# Patient Record
Sex: Female | Born: 1955 | Race: Black or African American | Hispanic: No | Smoking: Never smoker
Health system: Southern US, Community
[De-identification: ages and names within clinical notes are randomized; demographics above are authoritative.]

## PROBLEM LIST (undated history)

## (undated) ENCOUNTER — Emergency Department (HOSPITAL_BASED_OUTPATIENT_CLINIC_OR_DEPARTMENT_OTHER): Admission: EM | Payer: Medicare Other

## (undated) DIAGNOSIS — F419 Anxiety disorder, unspecified: Secondary | ICD-10-CM

## (undated) DIAGNOSIS — Z9889 Other specified postprocedural states: Secondary | ICD-10-CM

## (undated) DIAGNOSIS — I10 Essential (primary) hypertension: Secondary | ICD-10-CM

## (undated) DIAGNOSIS — R112 Nausea with vomiting, unspecified: Secondary | ICD-10-CM

## (undated) DIAGNOSIS — K219 Gastro-esophageal reflux disease without esophagitis: Secondary | ICD-10-CM

## (undated) DIAGNOSIS — N289 Disorder of kidney and ureter, unspecified: Secondary | ICD-10-CM

## (undated) DIAGNOSIS — E213 Hyperparathyroidism, unspecified: Secondary | ICD-10-CM

## (undated) DIAGNOSIS — D649 Anemia, unspecified: Secondary | ICD-10-CM

## (undated) DIAGNOSIS — G4730 Sleep apnea, unspecified: Secondary | ICD-10-CM

## (undated) DIAGNOSIS — M1990 Unspecified osteoarthritis, unspecified site: Secondary | ICD-10-CM

## (undated) HISTORY — PX: TUBAL LIGATION: SHX77

## (undated) HISTORY — PX: ABDOMINAL HYSTERECTOMY: SHX81

---

## 2000-11-06 HISTORY — PX: CHOLECYSTECTOMY: SHX55

## 2003-11-07 HISTORY — PX: ABDOMINAL HYSTERECTOMY: SHX81

## 2006-11-06 HISTORY — PX: TOTAL KNEE ARTHROPLASTY: SHX125

## 2008-11-06 HISTORY — PX: COLONOSCOPY: SHX174

## 2015-11-07 HISTORY — PX: BREAST EXCISIONAL BIOPSY: SUR124

## 2017-07-11 DIAGNOSIS — N2581 Secondary hyperparathyroidism of renal origin: Secondary | ICD-10-CM | POA: Insufficient documentation

## 2017-11-06 HISTORY — PX: A/V FISTULAGRAM: CATH118298

## 2018-08-15 DIAGNOSIS — F329 Major depressive disorder, single episode, unspecified: Secondary | ICD-10-CM

## 2018-08-15 HISTORY — DX: Major depressive disorder, single episode, unspecified: F32.9

## 2018-12-19 DIAGNOSIS — N289 Disorder of kidney and ureter, unspecified: Secondary | ICD-10-CM

## 2018-12-19 HISTORY — DX: Disorder of kidney and ureter, unspecified: N28.9

## 2018-12-26 DIAGNOSIS — D509 Iron deficiency anemia, unspecified: Secondary | ICD-10-CM | POA: Insufficient documentation

## 2019-11-07 HISTORY — PX: IR AV DIALY SHUNT INTRO NEEDLE/INTRAC INITIAL W/PTA/STENT/IMG LT: IMG6104

## 2021-06-20 DIAGNOSIS — I739 Peripheral vascular disease, unspecified: Secondary | ICD-10-CM | POA: Insufficient documentation

## 2021-07-14 ENCOUNTER — Encounter (HOSPITAL_BASED_OUTPATIENT_CLINIC_OR_DEPARTMENT_OTHER): Payer: Self-pay | Admitting: Emergency Medicine

## 2021-07-14 ENCOUNTER — Other Ambulatory Visit: Payer: Self-pay

## 2021-07-14 ENCOUNTER — Emergency Department (HOSPITAL_BASED_OUTPATIENT_CLINIC_OR_DEPARTMENT_OTHER): Payer: Commercial Managed Care - PPO | Admitting: Radiology

## 2021-07-14 DIAGNOSIS — N186 End stage renal disease: Secondary | ICD-10-CM | POA: Diagnosis not present

## 2021-07-14 DIAGNOSIS — Z992 Dependence on renal dialysis: Secondary | ICD-10-CM | POA: Insufficient documentation

## 2021-07-14 DIAGNOSIS — Y93K1 Activity, walking an animal: Secondary | ICD-10-CM | POA: Diagnosis not present

## 2021-07-14 DIAGNOSIS — M25532 Pain in left wrist: Secondary | ICD-10-CM | POA: Diagnosis not present

## 2021-07-14 DIAGNOSIS — S6992XA Unspecified injury of left wrist, hand and finger(s), initial encounter: Secondary | ICD-10-CM | POA: Diagnosis present

## 2021-07-14 DIAGNOSIS — S63502A Unspecified sprain of left wrist, initial encounter: Secondary | ICD-10-CM | POA: Diagnosis not present

## 2021-07-14 DIAGNOSIS — W19XXXA Unspecified fall, initial encounter: Secondary | ICD-10-CM | POA: Insufficient documentation

## 2021-07-14 NOTE — ED Triage Notes (Signed)
Pt arrives pov with c/o mechanical fall pta, c/o Left wrist pain. Swelling, deformity noted.

## 2021-07-15 ENCOUNTER — Emergency Department (HOSPITAL_BASED_OUTPATIENT_CLINIC_OR_DEPARTMENT_OTHER)
Admission: EM | Admit: 2021-07-15 | Discharge: 2021-07-15 | Disposition: A | Discharge status: Home / Self Care | Payer: Commercial Managed Care - PPO | Attending: Emergency Medicine | Admitting: Emergency Medicine

## 2021-07-15 DIAGNOSIS — S63502A Unspecified sprain of left wrist, initial encounter: Secondary | ICD-10-CM

## 2021-07-15 HISTORY — DX: Disorder of kidney and ureter, unspecified: N28.9

## 2021-07-15 MED ORDER — ACETAMINOPHEN 500 MG PO TABS
1000.0000 mg | ORAL_TABLET | Freq: Once | ORAL | Status: AC
  Administered 2021-07-15: 1000 mg via ORAL
  Filled 2021-07-15: qty 2

## 2021-07-15 NOTE — ED Provider Notes (Signed)
[facility] EMERGENCY DEPT Provider Note   CSN: [id number] Arrival date & time: 07/14/21  2153     History Chief Complaint  Patient presents with   Lytle Michaels    Christina Vasquez is a 65 y.o. female.  Patient is a 65 year old female with history of end-stage renal disease on hemodialysis presenting with complaints of a left wrist injury.  She was walking the dog when she was pulled over and fell.  She landed on her left wrist.  She reports hearing a crack and has been having pain and swelling since.  She denies other injury.  The history is provided by the patient.      Past Medical History:  Diagnosis Date   Renal disorder     There are no problems to display for this patient.   Past Surgical History:  Procedure Laterality Date   ABDOMINAL HYSTERECTOMY       OB History   No obstetric history on file.     History reviewed. No pertinent family history.  Social History   Tobacco Use   Smoking status: Never  Substance Use Topics   Alcohol use: Not Currently    Comment: occ   Drug use: Never    Home Medications Prior to Admission medications   Not on File    Allergies    Tetracyclines & related  Review of Systems   Review of Systems  All other systems reviewed and are negative.  Physical Exam Updated Vital Signs BP (!) 141/102 (BP Location: Right Arm)   Pulse 81   Temp 99.4 F (37.4 C) (Oral)   Resp 20   Ht 5\' 2"  (1.575 m)   Wt 128.8 kg   SpO2 100%   BMI 51.94 kg/m   Physical Exam Vitals and nursing note reviewed.  Constitutional:      General: She is not in acute distress.    Appearance: Normal appearance. She is not ill-appearing.  HENT:     Head: Normocephalic and atraumatic.  Pulmonary:     Effort: Pulmonary effort is normal.  Musculoskeletal:     Comments: There is swelling overlying the distal radius.  There is no obvious deformity otherwise.  Ulnar and radial pulses are easily palpable and motor and sensation are intact  throughout all fingers.  Skin:    General: Skin is warm and dry.  Neurological:     Mental Status: She is alert.    ED Results / Procedures / Treatments   Labs (all labs ordered are listed, but only abnormal results are displayed) Labs Reviewed - No data to display  EKG None  Radiology DG Forearm Left  Result Date: 07/14/2021 CLINICAL DATA:  Swelling, fall EXAM: LEFT FOREARM - 2 VIEW COMPARISON:  None. FINDINGS: No acute bony abnormality. Specifically, no fracture, subluxation, or dislocation. Soft tissues unremarkable. IMPRESSION: No acute bony abnormality. Electronically Signed   By: Rolm Baptise M.D.   On: 07/14/2021 22:35   DG Wrist Complete Left  Result Date: 07/14/2021 CLINICAL DATA:  Fall, left wrist pain, deformity, swelling EXAM: LEFT WRIST - COMPLETE 3+ VIEW COMPARISON:  None. FINDINGS: Subtle lucency noted in the distal left radius without well-defined fracture line. This may be related to osteopenia. No subluxation or dislocation. Soft tissues are intact. IMPRESSION: Subtle lucency in the distal radius without well-defined fracture line. Consider immobilization and repeat imaging in 1 week if symptoms persist. Electronically Signed   By: Rolm Baptise M.D.   On: 07/14/2021 22:35    Procedures Procedures  Medications Ordered in ED Medications - No data to display  ED Course  I have reviewed the triage vital signs and the nursing notes.  Pertinent labs & imaging results that were available during my care of the patient were reviewed by me and considered in my medical decision making (see chart for details).    MDM Rules/Calculators/A&P  X-rays show a subtle lucency over the distal radius that could potentially be a nondisplaced fracture.  Patient will be placed in a volar wrist splint and advised to follow-up with orthopedics in 1 week.  Final Clinical Impression(s) / ED Diagnoses Final diagnoses:  None    Rx / DC Orders ED Discharge Orders     None         Veryl Speak, MD 07/15/21 [phone]

## 2021-07-15 NOTE — Discharge Instructions (Addendum)
Wear wrist splint as applied until followed up by orthopedics.  Ice for 20 minutes every 2 hours while awake for the next 2 days.  Take Tylenol 1000 mg every 6 hours as needed for pain.  Follow-up with orthopedics in 1 week.  The contact information for Dr. Sammuel Hines has been provided in this discharge summary for you to call and make these arrangements.

## 2021-07-15 NOTE — ED Notes (Signed)
Patient left ED with ABCs intact, alert and oriented x4, respirations even and unlabored. Discharge instructions reviewed and all questions answered.   

## 2021-07-15 NOTE — ED Notes (Signed)
Patient fell on slope last night around 2130 after foot slipped. Did not hit head, -LOC. Stated heard left wrist "crack". +PMS. Complaining of 8/10 pain. ABCs intact, alert and oriented x4, respirations even and unlabored.

## 2021-07-22 ENCOUNTER — Encounter (HOSPITAL_BASED_OUTPATIENT_CLINIC_OR_DEPARTMENT_OTHER): Payer: Self-pay | Admitting: Orthopaedic Surgery

## 2021-07-22 ENCOUNTER — Encounter: Payer: Self-pay | Admitting: Orthopaedic Surgery

## 2021-07-22 ENCOUNTER — Ambulatory Visit (HOSPITAL_BASED_OUTPATIENT_CLINIC_OR_DEPARTMENT_OTHER): Payer: Commercial Managed Care - PPO | Admitting: Orthopaedic Surgery

## 2021-07-22 ENCOUNTER — Other Ambulatory Visit (HOSPITAL_BASED_OUTPATIENT_CLINIC_OR_DEPARTMENT_OTHER): Payer: Self-pay

## 2021-07-22 ENCOUNTER — Other Ambulatory Visit: Payer: Self-pay

## 2021-07-22 DIAGNOSIS — S63502A Unspecified sprain of left wrist, initial encounter: Secondary | ICD-10-CM | POA: Diagnosis not present

## 2021-07-22 MED ORDER — DICLOFENAC SODIUM 1 % EX GEL: 4.0000 g | Freq: Four times a day (QID) | CUTANEOUS | Status: DC

## 2021-07-22 MED ORDER — DICLOFENAC SODIUM 1 % EX GEL
CUTANEOUS | 2 refills | Status: DC
  Filled 2021-07-22: qty 300, 18d supply, fill #0

## 2021-07-22 NOTE — Addendum Note (Signed)
Addended by: Yevonne Pax on: 07/22/2021 11:49 AM   Modules accepted: Orders

## 2021-07-22 NOTE — Progress Notes (Signed)
Chief Complaint: Left wrist pain     History of Present Illness:   Pain Score: 0/10 SANE: 75/100  Christina Vasquez is a 65 y.o. female with left wrist pain after she struck it directly on a tree root on July 15, 2021.  She felt a pop and immediately went to the emergency room.  At that time she was told she possibly had a nondisplaced fracture and was subsequently placed in a wrist brace.  She presents today with continued pain diffusely about the wrist.  This is worse with any type of motion.  She has been wearing her brace 24/7.  She is currently retired.  She denies any numbness or tingling to the fingers.  Overall she is slowly improving.  She has been taking Tylenol which helps somewhat but is unable to take anti-inflammatories due to kidney disease.    Surgical History:   None  PMH/PSH/Family History/Social History/Meds/Allergies:    Past Medical History:  Diagnosis Date   Renal disorder    Past Surgical History:  Procedure Laterality Date   ABDOMINAL HYSTERECTOMY     Social History   Socioeconomic History   Marital status: Widowed    Spouse name: Not on file   Number of children: Not on file   Years of education: Not on file   Highest education level: Not on file  Occupational History   Not on file  Tobacco Use   Smoking status: Never   Smokeless tobacco: Not on file  Substance and Sexual Activity   Alcohol use: Not Currently    Comment: occ   Drug use: Never   Sexual activity: Not on file  Other Topics Concern   Not on file  Social History Narrative   Not on file   Social Determinants of Health   Financial Resource Strain: Not on file  Food Insecurity: Not on file  Transportation Needs: Not on file  Physical Activity: Not on file  Stress: Not on file  Social Connections: Not on file   History reviewed. No pertinent family history. Allergies  Allergen Reactions   Tetracyclines & Related Hives   No current  outpatient medications on file.   Current Facility-Administered Medications  Medication Dose Route Frequency Provider Last Rate Last Admin   diclofenac Sodium (VOLTAREN) 1 % topical gel 4 g  4 g Topical QID Vanetta Mulders, MD       No results found.  Review of Systems:   A ROS was performed including pertinent positives and negatives as documented in the HPI.  Physical Exam :   Constitutional: NAD and appears stated age Neurological: Alert and oriented Psych: Appropriate affect and cooperative There were no vitals taken for this visit.   Comprehensive Musculoskeletal Exam:   She is positively tender about the dorsal aspect of the wrist profusely.  She is also tender volarly about the length of the FCR.  She has tenderness with resisted wrist extension.  Her range of motion of the wrist is approximately 20 degrees of flexion extension this causes pain.  She has pain with any pro supination.  2+ radial pulse.  Compartments are soft and compressible  Imaging:   Xray (3 views of left wrist): Normal   I personally reviewed and interpreted the radiographs.   Assessment:   65 year old female with left wrist pain  consistent with a strain.  I do not see any fracture on her x-ray.  I have advised that oftentimes strains can be more disabling than a discrete fracture.  At this time I would like her to come out of her wrist brace in order to start performing range of motion so that the joint does not become stiff and painful.  She will work on flexion extension as well as pro supination.  I have provided a prescription for Voltaren gel as she is unable to take anti-inflammatories.  She may be in her brace for more strenuous activities.  Plan :    -Follow-up with me in 1 month if no improvement    I personally saw and evaluated the patient, and participated in the management and treatment plan.  Vanetta Mulders, MD Attending Physician, Orthopedic Surgery  This document was dictated using  Dragon voice recognition software. A reasonable attempt at proof reading has been made to minimize errors.

## 2021-07-25 ENCOUNTER — Ambulatory Visit (HOSPITAL_BASED_OUTPATIENT_CLINIC_OR_DEPARTMENT_OTHER): Payer: Commercial Managed Care - PPO | Admitting: Orthopaedic Surgery

## 2022-03-02 ENCOUNTER — Other Ambulatory Visit: Payer: Self-pay | Admitting: Orthopedic Surgery

## 2022-03-02 DIAGNOSIS — M75101 Unspecified rotator cuff tear or rupture of right shoulder, not specified as traumatic: Secondary | ICD-10-CM

## 2022-03-11 ENCOUNTER — Ambulatory Visit
Admission: RE | Admit: 2022-03-11 | Discharge: 2022-03-11 | Disposition: A | Discharge status: Home / Self Care | Payer: Commercial Managed Care - PPO | Source: Ambulatory Visit | Attending: Orthopedic Surgery | Admitting: Orthopedic Surgery

## 2022-03-11 DIAGNOSIS — M12811 Other specific arthropathies, not elsewhere classified, right shoulder: Secondary | ICD-10-CM

## 2022-03-28 ENCOUNTER — Other Ambulatory Visit: Payer: Self-pay | Admitting: Orthopaedic Surgery

## 2022-03-28 DIAGNOSIS — Z01818 Encounter for other preprocedural examination: Secondary | ICD-10-CM

## 2022-04-07 ENCOUNTER — Ambulatory Visit
Admission: RE | Admit: 2022-04-07 | Discharge: 2022-04-07 | Disposition: A | Discharge status: Home / Self Care | Payer: Medicare Other | Source: Ambulatory Visit | Attending: Orthopaedic Surgery | Admitting: Orthopaedic Surgery

## 2022-04-07 DIAGNOSIS — Z01818 Encounter for other preprocedural examination: Secondary | ICD-10-CM

## 2022-05-26 NOTE — Pre-Procedure Instructions (Addendum)
Surgical Instructions    Your procedure is scheduled on Thursday, July 27th.  Report to [facility] Main Entrance "A" at 5:30 A.M., then check in with the Admitting office.  Call this number if you have problems the morning of surgery:  [phone]   If you have any questions prior to your surgery date call [phone]: Open Monday-Friday 8am-4pm    Remember:  Do not eat after midnight the night before your surgery  You may drink clear liquids until 4:30 a.m. the morning of your surgery.   Clear liquids allowed are: Water, Non-Citrus Juices (without pulp), Carbonated Beverages, Clear Tea, Black Coffee Only (NO MILK, CREAM OR POWDERED CREAMER of any kind), and Gatorade.   Enhanced Recovery after Surgery for Orthopedics Enhanced Recovery after Surgery is a protocol used to improve the stress on your body and your recovery after surgery.  Patient Instructions  The day of surgery (if you do NOT have diabetes):  Drink ONE (1) Pre-Surgery Clear Ensure by 4:30 am the morning of surgery   This drink was given to you during your hospital  pre-op appointment visit. Nothing else to drink after completing the  Pre-Surgery Clear Ensure.         If you have questions, please contact your surgeon's office.     Take these medicines the morning of surgery with A SIP OF WATER  amLODipine (NORVASC)  venlafaxine (EFFEXOR)  acetaminophen (TYLENOL)-as needed  As of today, STOP taking any Aspirin (unless otherwise instructed by your surgeon) Aleve, Naproxen, Ibuprofen, Motrin, Advil, Goody's, BC's, all herbal medications, fish oil, and all vitamins.          WHAT DO I DO ABOUT MY DIABETES MEDICATION?  The day of surgery, do not take other diabetes injectables, including MOUNJARO.    HOW TO MANAGE YOUR DIABETES BEFORE AND AFTER SURGERY  Why is it important to control my blood sugar before and after surgery? Improving blood sugar levels before and after surgery helps healing and can limit  problems. A way of improving blood sugar control is eating a healthy diet by:  Eating less sugar and carbohydrates  Increasing activity/exercise  Talking with your doctor about reaching your blood sugar goals High blood sugars (greater than 180 mg/dL) can raise your risk of infections and slow your recovery, so you will need to focus on controlling your diabetes during the weeks before surgery. Make sure that the doctor who takes care of your diabetes knows about your planned surgery including the date and location.  How do I manage my blood sugar before surgery? Check your blood sugar at least 4 times a day, starting 2 days before surgery, to make sure that the level is not too high or low.  Check your blood sugar the morning of your surgery when you wake up and every 2 hours until you get to the Short Stay unit.  If your blood sugar is less than 70 mg/dL, you will need to treat for low blood sugar: Do not take insulin. Treat a low blood sugar (less than 70 mg/dL) with  cup of clear juice (cranberry or apple), 4 glucose tablets, OR glucose gel. Recheck blood sugar in 15 minutes after treatment (to make sure it is greater than 70 mg/dL). If your blood sugar is not greater than 70 mg/dL on recheck, call [phone] for further instructions. Report your blood sugar to the short stay nurse when you get to Short Stay.  If you are admitted to the hospital after surgery:  Your blood sugar will be checked by the staff and you will probably be given insulin after surgery (instead of oral diabetes medicines) to make sure you have good blood sugar levels. The goal for blood sugar control after surgery is 80-180 mg/dL.             Do NOT Smoke (Tobacco/Vaping) for 24 hours prior to your procedure.  If you use a CPAP at night, you may bring your mask/headgear for your overnight stay.   Contacts, glasses, piercing's, hearing aid's, dentures or partials may not be worn into surgery, please bring cases  for these belongings.    For patients admitted to the hospital, discharge time will be determined by your treatment team.   Patients discharged the day of surgery will not be allowed to drive home, and someone needs to stay with them for 24 hours.  SURGICAL WAITING ROOM VISITATION Patients having surgery or a procedure may have no more than 2 support people in the waiting area - these visitors may rotate.   Children under the age of 27 must have an adult with them who is not the patient. If the patient needs to stay at the hospital during part of their recovery, the visitor guidelines for inpatient rooms apply. Pre-op nurse will coordinate an appropriate time for 1 support person to accompany patient in pre-op.  This support person may not rotate.   Please refer to the [facility] website for the visitor guidelines for Inpatients (after your surgery is over and you are in a regular room).                                     [facility]- Preparing for Total Shoulder Arthroplasty   Before surgery, you can play an important role. Because skin is not sterile, your skin needs to be as free of germs as possible. You can reduce the number of germs on your skin by using the following products. Benzoyl Peroxide Gel Reduces the number of germs present on the skin Applied twice a day to shoulder area starting two days before surgery   Chlorhexidine Gluconate (CHG) Soap An antiseptic cleaner that kills germs and bonds with the skin to continue killing germs even after washing Used for showering the night before surgery and morning of surgery   Oral Hygiene is also important to reduce your risk of infection.                                    Remember - BRUSH YOUR TEETH THE MORNING OF SURGERY WITH YOUR REGULAR TOOTHPASTE  ==================================================================  Please follow these instructions carefully:  BENZOYL PEROXIDE 5% GEL  Please do not use if you have an  allergy to benzoyl peroxide.   If your skin becomes reddened/irritated stop using the benzoyl peroxide.  Starting two days before surgery, apply as follows: Apply benzoyl peroxide in the morning and at night. Apply after taking a shower. If you are not taking a shower clean entire shoulder front, back, and side along with the armpit with a clean wet washcloth.  Place a quarter-sized dollop on your shoulder and rub in thoroughly, making sure to cover the front, back, and side of your shoulder, along with the armpit.   2 days before ____ AM   ____ PM  1 day before ____ AM   ____ PM                             Do this twice a day for two days.  (Last application is the night before surgery, AFTER using the CHG soap as described below).  Do NOT apply benzoyl peroxide gel on the day of surgery.  CHLORHEXIDINE GLUCONATE (CHG) SOAP  Please do not use if you have an allergy to CHG or antibacterial soaps. If your skin becomes reddened/irritated stop using the CHG.   Do not shave (including legs and underarms) for at least 48 hours prior to first CHG shower. It is OK to shave your face.  Starting the night before surgery, use CHG soap as follows:  Shower the NIGHT BEFORE SURGERY and MORNING OF SURGERY with CHG.  If you choose to wash your hair, wash your hair first as usual with your normal shampoo.  After shampooing, rinse your hair and body thoroughly to remove the shampoo.  Use CHG as you would any other liquid soap.  You can apply CHG directly to the skin and wash gently with a scrungie or a clean washcloth.  Apply the CHG soap to your body ONLY FROM THE NECK DOWN.  Do not use on open wounds or open sores.  Avoid contact with your eyes, ears, mouth, and genitals (private parts).  Wash face and genitals (private parts) with your normal soap.  Wash thoroughly, paying special attention to the area where your surgery will be performed.  Thoroughly rinse your body with warm water  from the neck down.  DO NOT shower/wash with your normal soap after using and rinsing off the CHG soap.   Pat yourself dry with a CLEAN TOWEL.    Apply benzoyl peroxide.   Wear CLEAN PAJAMAS to bed the night before surgery; wear comfortable clothes the morning of surgery.  Place CLEAN SHEETS on your bed the night of your first shower and DO NOT SLEEP WITH PETS.    Day of Surgery: Take a shower with CHG soap. Do not wear jewelry or makeup Do not wear lotions, powders, perfumes, or deodorant. Do not shave 48 hours prior to surgery.  Do not bring valuables to the hospital.  [facility] is not responsible for any belongings or valuables. Do not wear nail polish, gel polish, artificial nails, or any other type of covering on natural nails (fingers and toes) If you have artificial nails or gel coating that need to be removed by a nail salon, please have this removed prior to surgery. Artificial nails or gel coating may interfere with anesthesia's ability to adequately monitor your vital signs. Wear Clean/Comfortable clothing the morning of surgery Remember to brush your teeth WITH YOUR REGULAR TOOTHPASTE.   Please read over the following fact sheets that you were given.    If you received a COVID test during your pre-op visit  it is requested that you wear a mask when out in public, stay away from anyone that may not be feeling well and notify your surgeon if you develop symptoms. If you have been in contact with anyone that has tested positive in the last 10 days please notify you surgeon.

## 2022-05-29 ENCOUNTER — Other Ambulatory Visit: Payer: Self-pay

## 2022-05-29 ENCOUNTER — Encounter (HOSPITAL_COMMUNITY)
Admission: RE | Admit: 2022-05-29 | Discharge: 2022-05-29 | Disposition: A | Discharge status: Home / Self Care | Payer: Medicare Other | Source: Ambulatory Visit | Attending: Orthopaedic Surgery | Admitting: Orthopaedic Surgery

## 2022-05-29 ENCOUNTER — Encounter (HOSPITAL_COMMUNITY): Payer: Self-pay

## 2022-05-29 VITALS — BP 146/76 | HR 83 | Temp 98.6°F | Resp 18 | Ht 62.0 in | Wt 233.0 lb

## 2022-05-29 DIAGNOSIS — I10 Essential (primary) hypertension: Secondary | ICD-10-CM

## 2022-05-29 DIAGNOSIS — Z01818 Encounter for other preprocedural examination: Secondary | ICD-10-CM

## 2022-05-29 HISTORY — DX: Essential (primary) hypertension: I10

## 2022-05-29 HISTORY — DX: Other specified postprocedural states: Z98.890

## 2022-05-29 HISTORY — DX: Sleep apnea, unspecified: G47.30

## 2022-05-29 HISTORY — DX: Nausea with vomiting, unspecified: R11.2

## 2022-05-29 HISTORY — DX: Anxiety disorder, unspecified: F41.9

## 2022-05-29 HISTORY — DX: Unspecified osteoarthritis, unspecified site: M19.90

## 2022-05-29 LAB — CBC
HCT: 31.6 % — ABNORMAL LOW (ref 36.0–46.0)
Hemoglobin: 10.5 g/dL — ABNORMAL LOW (ref 12.0–15.0)
MCH: 27.9 pg (ref 26.0–34.0)
MCHC: 33.2 g/dL (ref 30.0–36.0)
MCV: 83.8 fL (ref 80.0–100.0)
Platelets: 186 10*3/uL (ref 150–400)
RBC: 3.77 MIL/uL — ABNORMAL LOW (ref 3.87–5.11)
RDW: 14.8 % (ref 11.5–15.5)
WBC: 5.4 10*3/uL (ref 4.0–10.5)
nRBC: 0 % (ref 0.0–0.2)

## 2022-05-29 LAB — BASIC METABOLIC PANEL
Anion gap: 10 (ref 5–15)
BUN: 12 mg/dL (ref 8–23)
CO2: 26 mmol/L (ref 22–32)
Calcium: 8.8 mg/dL — ABNORMAL LOW (ref 8.9–10.3)
Chloride: 104 mmol/L (ref 98–111)
Creatinine, Ser: 3.58 mg/dL — ABNORMAL HIGH (ref 0.44–1.00)
GFR, Estimated: 14 mL/min — ABNORMAL LOW (ref >60)
Glucose, Bld: 81 mg/dL (ref 70–99)
Potassium: 3.7 mmol/L (ref 3.5–5.1)
Sodium: 140 mmol/L (ref 135–145)

## 2022-05-29 LAB — SURGICAL PCR SCREEN
MRSA, PCR: NEGATIVE
Staphylococcus aureus: POSITIVE — AB

## 2022-05-29 NOTE — Progress Notes (Signed)
PCP - Dr. Anderson Malta Jo-pt does not currently have a PCP locally. Pt recently moved from [place] to [place].  Cardiologist - Dr. Malachi Carl Pagidipati  PPM/ICD - n/a  Chest x-ray - n/a EKG - 05/29/22 Stress Test - 05/21/18 ECHO - 05/27/18 Cardiac Cath - denies  Sleep Study - OSA+ CPAP - denies use   Blood Thinner Instructions: n/a Aspirin Instructions: n/a  ERAS Protcol -Clear liquids until 0430 DOS PRE-SURGERY Ensure or G2- Ensure provided.  COVID TEST- n/a  Anesthesia review: Yes, HD pt.  Patient denies shortness of breath, fever, cough and chest pain at PAT appointment   All instructions explained to the patient, with a verbal understanding of the material. Patient agrees to go over the instructions while at home for a better understanding. Patient also instructed to self quarantine after being tested for COVID-19. The opportunity to ask questions was provided.

## 2022-05-31 NOTE — H&P (Signed)
PREOPERATIVE H&P  Chief Complaint: DJD RIGHT SHOULDER  HPI: Christina Vasquez is a 66 y.o. female who presents for preoperative history and physical prior to scheduled surgery, Procedure(s): REVERSE SHOULDER ARTHROPLASTY.   Patient has a past medical history significant for stage 4 ESRD, PONV, HTN, sleep apnea.   Patient is a 66 year-old female who presents with bilateral shoulder complaints.  She said the right shoulder has bothered her for years.  She did have a previous surgery on the right shoulder.  She describes it as an arthroscopy with some other procedures.  She has had decreased range of motion with progressively worsening increasing pain.  Left shoulder presents with about two months now.  She states she was holding a door open in extension and felt something tear.  She has had increased pain since that time as well.  Symptoms are rated as moderate to severe, and have been worsening.  This is significantly impairing activities of daily living.    Please see clinic note for further details on this patient's care.    She has elected for surgical management.   Past Medical History:  Diagnosis Date   Anxiety    Arthritis    Hypertension    PONV (postoperative nausea and vomiting)    Renal disorder 12/19/2018   Stage 4 ESRD. HD-MWF   Sleep apnea    Past Surgical History:  Procedure Laterality Date   ABDOMINAL HYSTERECTOMY     Social History   Socioeconomic History   Marital status: Widowed    Spouse name: Not on file   Number of children: Not on file   Years of education: Not on file   Highest education level: Not on file  Occupational History   Not on file  Tobacco Use   Smoking status: Never   Smokeless tobacco: Not on file  Vaping Use   Vaping Use: Never used  Substance and Sexual Activity   Alcohol use: Not Currently    Comment: occ   Drug use: Never   Sexual activity: Not on file  Other Topics Concern   Not on file  Social History Narrative   Not  on file   Social Determinants of Health   Financial Resource Strain: Not on file  Food Insecurity: Not on file  Transportation Needs: Not on file  Physical Activity: Not on file  Stress: Not on file  Social Connections: Not on file   No family history on file. Allergies  Allergen Reactions   Tetracyclines & Related Hives   Prior to Admission medications   Medication Sig Start Date End Date Taking? Authorizing Provider  acetaminophen (TYLENOL) 650 MG CR tablet Take 1,300 mg by mouth every 8 (eight) hours as needed for pain.   Yes [provider]  amLODipine (NORVASC) 10 MG tablet Take 10 mg by mouth daily. 03/10/22  Yes [provider]  metoprolol succinate (TOPROL-XL) 100 MG 24 hr tablet Take 100 mg by mouth at bedtime. 03/10/22  Yes [provider]  MOUNJARO 2.5 MG/0.5ML Pen Inject 2.5 mg into the skin every Saturday. 05/08/22  Yes [provider]  sucroferric oxyhydroxide (VELPHORO) 500 MG chewable tablet Chew 1,000 mg by mouth 3 (three) times daily with meals.   Yes [provider]  venlafaxine (EFFEXOR) 37.5 MG tablet Take 37.5 mg by mouth daily. 12/01/21  Yes [provider]    ROS: All other systems have been reviewed and were otherwise negative with the exception of those mentioned in the  HPI and as above.  Physical Exam: General: Alert, no acute distress Cardiovascular: No pedal edema Respiratory: No cyanosis, no use of accessory musculature GI: No organomegaly, abdomen is soft and non-tender Skin: No lesions in the area of chief complaint Neurologic: Sensation intact distally Psychiatric: Patient is competent for consent with normal mood and affect Lymphatic: No axillary or cervical lymphadenopathy  MUSCULOSKELETAL:  On examination active forward elevation on the right to about 70, passive  80-90.  External rotation to -45. Internal rotation to the front pocket.  Left side active forward elevation to about 140. Passive the  same. External rotation to zero. Internal rotation to the belt  line. Cuff strength is weak throughout.   Imaging: MRI and X-rays reviewed of the bilateral shoulders and shows severe glenohumeral arthritis. On the right side she has significant bone loss in the glenoid as well.   BMI: Estimated body mass index is 42.62 kg/m as calculated from the following:   Height as of 05/29/22: '5\' 2"'$  (1.575 m).   Weight as of 05/29/22: 105.7 kg.  No results found for: "ALBUMIN" Diabetes: Patient does not have a diagnosis of diabetes.     Smoking Status:   reports that she has never smoked. She does not have any smokeless tobacco history on file.    Assessment: DJD RIGHT SHOULDER  Plan: Plan for Procedure(s): REVERSE SHOULDER ARTHROPLASTY  The risks benefits and alternatives were discussed with the patient including but not limited to the risks of nonoperative treatment, versus surgical intervention including infection, bleeding, nerve injury,  blood clots, cardiopulmonary complications, morbidity, mortality, among others, and they were willing to proceed.   We additionally specifically discussed risks of axillary nerve injury, infection, periprosthetic fracture, continued pain and longevity of implants prior to beginning procedure.    Patient will be admitted for inpatient treatment for surgery, pain control, OT, prophylactic antibiotics, VTE prophylaxis, and discharge planning. The patient is planning to be discharged home with outpatient PT.   The patient acknowledged the explanation, agreed to proceed with the plan and consent was signed.   She received operative clearance from her PCP and nephrologist. She will be admitted overnight with a plan for dialysis in the morning after surgery.  Operative Plan: Right reverse total shoulder arthroplasty Discharge Medications: Standard DVT Prophylaxis: Aspirin Physical Therapy: Outpatient PT Special Discharge needs: Sling. [place], PA-C  05/31/2022 2:24 PM

## 2022-05-31 NOTE — Anesthesia Preprocedure Evaluation (Signed)
Anesthesia Evaluation  Patient identified by MRN, date of birth, ID band Patient awake    Reviewed: Allergy & Precautions, NPO status , Patient's Chart, lab work & pertinent test results  History of Anesthesia Complications Negative for: history of anesthetic complications  Airway Mallampati: III  TM Distance: >3 FB Neck ROM: Full    Dental  (+) Dental Advisory Given   Pulmonary sleep apnea ,    Pulmonary exam normal        Cardiovascular hypertension, Pt. on home beta blockers and Pt. on medications Normal cardiovascular exam     Neuro/Psych negative neurological ROS     GI/Hepatic negative GI ROS, Neg liver ROS,   Endo/Other  Morbid obesity  Renal/GU ESRF and DialysisRenal disease  negative genitourinary   Musculoskeletal negative musculoskeletal ROS (+)   Abdominal   Peds  Hematology  (+) Blood dyscrasia, anemia ,   Anesthesia Other Findings   Reproductive/Obstetrics                           Anesthesia Physical Anesthesia Plan  ASA: 3  Anesthesia Plan: General   Post-op Pain Management: Regional block* and Tylenol PO (pre-op)*   Induction: Intravenous  PONV Risk Score and Plan: 3 and Ondansetron, Dexamethasone, Treatment may vary due to age or medical condition and Midazolam  Airway Management Planned: Oral ETT  Additional Equipment: None  Intra-op Plan:   Post-operative Plan: Extubation in OR  Informed Consent: I have reviewed the patients History and Physical, chart, labs and discussed the procedure including the risks, benefits and alternatives for the proposed anesthesia with the patient or authorized representative who has indicated his/her understanding and acceptance.     Dental advisory given  Plan Discussed with:   Anesthesia Plan Comments:        Anesthesia Quick Evaluation

## 2022-06-01 ENCOUNTER — Encounter (HOSPITAL_COMMUNITY): Payer: Self-pay | Admitting: Orthopaedic Surgery

## 2022-06-01 ENCOUNTER — Encounter (HOSPITAL_COMMUNITY)
Admission: RE | Disposition: A | Discharge status: Home / Self Care | Payer: Self-pay | Source: Home / Self Care | Attending: Orthopaedic Surgery

## 2022-06-01 ENCOUNTER — Ambulatory Visit (HOSPITAL_COMMUNITY): Payer: Medicare Other

## 2022-06-01 ENCOUNTER — Observation Stay (HOSPITAL_COMMUNITY)
Admission: RE | Admit: 2022-06-01 | Discharge: 2022-06-02 | Disposition: A | Discharge status: Home / Self Care | Payer: Medicare Other | Attending: Orthopaedic Surgery | Admitting: Orthopaedic Surgery

## 2022-06-01 ENCOUNTER — Other Ambulatory Visit: Payer: Self-pay

## 2022-06-01 ENCOUNTER — Ambulatory Visit (HOSPITAL_BASED_OUTPATIENT_CLINIC_OR_DEPARTMENT_OTHER): Payer: Medicare Other | Admitting: Anesthesiology

## 2022-06-01 ENCOUNTER — Ambulatory Visit (HOSPITAL_COMMUNITY): Payer: Medicare Other | Admitting: Physician Assistant

## 2022-06-01 DIAGNOSIS — N186 End stage renal disease: Secondary | ICD-10-CM

## 2022-06-01 DIAGNOSIS — D631 Anemia in chronic kidney disease: Secondary | ICD-10-CM | POA: Insufficient documentation

## 2022-06-01 DIAGNOSIS — M19011 Primary osteoarthritis, right shoulder: Secondary | ICD-10-CM

## 2022-06-01 DIAGNOSIS — E1169 Type 2 diabetes mellitus with other specified complication: Secondary | ICD-10-CM | POA: Diagnosis not present

## 2022-06-01 DIAGNOSIS — I120 Hypertensive chronic kidney disease with stage 5 chronic kidney disease or end stage renal disease: Secondary | ICD-10-CM | POA: Diagnosis not present

## 2022-06-01 DIAGNOSIS — Z992 Dependence on renal dialysis: Secondary | ICD-10-CM

## 2022-06-01 DIAGNOSIS — Z01818 Encounter for other preprocedural examination: Secondary | ICD-10-CM

## 2022-06-01 DIAGNOSIS — Z79899 Other long term (current) drug therapy: Secondary | ICD-10-CM | POA: Diagnosis not present

## 2022-06-01 DIAGNOSIS — Z96611 Presence of right artificial shoulder joint: Secondary | ICD-10-CM

## 2022-06-01 HISTORY — PX: REVERSE SHOULDER ARTHROPLASTY: SHX5054

## 2022-06-01 LAB — POCT I-STAT, CHEM 8
BUN: 24 mg/dL — ABNORMAL HIGH (ref 8–23)
Calcium, Ion: 1.18 mmol/L (ref 1.15–1.40)
Chloride: 95 mmol/L — ABNORMAL LOW (ref 98–111)
Creatinine, Ser: 4.8 mg/dL — ABNORMAL HIGH (ref 0.44–1.00)
Glucose, Bld: 79 mg/dL (ref 70–99)
HCT: 30 % — ABNORMAL LOW (ref 36.0–46.0)
Hemoglobin: 10.2 g/dL — ABNORMAL LOW (ref 12.0–15.0)
Potassium: 3.4 mmol/L — ABNORMAL LOW (ref 3.5–5.1)
Sodium: 138 mmol/L (ref 135–145)
TCO2: 30 mmol/L (ref 22–32)

## 2022-06-01 SURGERY — ARTHROPLASTY, SHOULDER, TOTAL, REVERSE
Anesthesia: General | Site: Shoulder | Laterality: Right

## 2022-06-01 MED ORDER — ONDANSETRON HCL 4 MG/2ML IJ SOLN
INTRAMUSCULAR | Status: AC
  Filled 2022-06-01: qty 2

## 2022-06-01 MED ORDER — 0.9 % SODIUM CHLORIDE (POUR BTL) OPTIME
TOPICAL | Status: DC | PRN
  Administered 2022-06-01: 1000 mL

## 2022-06-01 MED ORDER — OXYCODONE HCL 5 MG/5ML PO SOLN: 5.0000 mg | Freq: Once | ORAL | Status: DC | PRN

## 2022-06-01 MED ORDER — SUFENTANIL CITRATE 50 MCG/ML IV SOLN
INTRAVENOUS | Status: DC | PRN
  Administered 2022-06-01 (×2): 10 ug via INTRAVENOUS

## 2022-06-01 MED ORDER — LIDOCAINE 2% (20 MG/ML) 5 ML SYRINGE
INTRAMUSCULAR | Status: DC | PRN
  Administered 2022-06-01: 100 mg via INTRAVENOUS

## 2022-06-01 MED ORDER — ZOLPIDEM TARTRATE 5 MG PO TABS: 5.0000 mg | ORAL_TABLET | Freq: Every evening | ORAL | Status: DC | PRN

## 2022-06-01 MED ORDER — SUGAMMADEX SODIUM 200 MG/2ML IV SOLN
INTRAVENOUS | Status: DC | PRN
  Administered 2022-06-01: 200 mg via INTRAVENOUS

## 2022-06-01 MED ORDER — LIDOCAINE 2% (20 MG/ML) 5 ML SYRINGE
INTRAMUSCULAR | Status: AC
  Filled 2022-06-01: qty 5

## 2022-06-01 MED ORDER — VANCOMYCIN HCL 1000 MG IV SOLR
INTRAVENOUS | Status: DC | PRN
  Administered 2022-06-01: 1000 mg

## 2022-06-01 MED ORDER — TRANEXAMIC ACID-NACL 1000-0.7 MG/100ML-% IV SOLN
1000.0000 mg | INTRAVENOUS | Status: AC
  Administered 2022-06-01: 1000 mg via INTRAVENOUS
  Filled 2022-06-01: qty 100

## 2022-06-01 MED ORDER — SUCROFERRIC OXYHYDROXIDE 500 MG PO CHEW
1000.0000 mg | CHEWABLE_TABLET | Freq: Three times a day (TID) | ORAL | Status: DC
  Administered 2022-06-01 – 2022-06-02 (×3): 1000 mg via ORAL
  Filled 2022-06-01 (×6): qty 2

## 2022-06-01 MED ORDER — VENLAFAXINE HCL 37.5 MG PO TABS
37.5000 mg | ORAL_TABLET | Freq: Every day | ORAL | Status: DC
  Administered 2022-06-01 – 2022-06-02 (×2): 37.5 mg via ORAL
  Filled 2022-06-01 (×2): qty 1

## 2022-06-01 MED ORDER — ORAL CARE MOUTH RINSE: 15.0000 mL | Freq: Once | OROMUCOSAL | Status: AC

## 2022-06-01 MED ORDER — SODIUM CHLORIDE 0.9 % IV SOLN: INTRAVENOUS | Status: DC

## 2022-06-01 MED ORDER — LACTATED RINGERS IV SOLN: INTRAVENOUS | Status: DC

## 2022-06-01 MED ORDER — CHLORHEXIDINE GLUCONATE 0.12 % MT SOLN
15.0000 mL | Freq: Once | OROMUCOSAL | Status: AC
  Administered 2022-06-01: 15 mL via OROMUCOSAL
  Filled 2022-06-01: qty 15

## 2022-06-01 MED ORDER — DEXAMETHASONE SODIUM PHOSPHATE 10 MG/ML IJ SOLN
INTRAMUSCULAR | Status: AC
  Filled 2022-06-01: qty 1

## 2022-06-01 MED ORDER — DEXAMETHASONE SODIUM PHOSPHATE 10 MG/ML IJ SOLN
INTRAMUSCULAR | Status: DC | PRN
  Administered 2022-06-01: 10 mg via INTRAVENOUS

## 2022-06-01 MED ORDER — CEFAZOLIN SODIUM-DEXTROSE 1-4 GM/50ML-% IV SOLN: 1.0000 g | Freq: Four times a day (QID) | INTRAVENOUS | Status: DC

## 2022-06-01 MED ORDER — ROCURONIUM 10MG/ML (10ML) SYRINGE FOR MEDFUSION PUMP - OPTIME
INTRAVENOUS | Status: DC | PRN
  Administered 2022-06-01: 100 mg via INTRAVENOUS

## 2022-06-01 MED ORDER — MIDAZOLAM HCL 2 MG/2ML IJ SOLN
INTRAMUSCULAR | Status: AC
  Filled 2022-06-01: qty 2

## 2022-06-01 MED ORDER — HYDROMORPHONE HCL 1 MG/ML IJ SOLN: 0.5000 mg | INTRAMUSCULAR | Status: DC | PRN

## 2022-06-01 MED ORDER — PROPOFOL 10 MG/ML IV BOLUS
INTRAVENOUS | Status: AC
  Filled 2022-06-01: qty 20

## 2022-06-01 MED ORDER — SODIUM CHLORIDE (PF) 0.9 % IJ SOLN
INTRAMUSCULAR | Status: AC
  Filled 2022-06-01: qty 10

## 2022-06-01 MED ORDER — BUPIVACAINE LIPOSOME 1.3 % IJ SUSP
INTRAMUSCULAR | Status: DC | PRN
  Administered 2022-06-01: 10 mL via PERINEURAL

## 2022-06-01 MED ORDER — PROPOFOL 10 MG/ML IV BOLUS
INTRAVENOUS | Status: DC | PRN
  Administered 2022-06-01: 140 mg via INTRAVENOUS

## 2022-06-01 MED ORDER — ONDANSETRON HCL 4 MG PO TABS: 4.0000 mg | ORAL_TABLET | Freq: Four times a day (QID) | ORAL | Status: DC | PRN

## 2022-06-01 MED ORDER — DOCUSATE SODIUM 100 MG PO CAPS
100.0000 mg | ORAL_CAPSULE | Freq: Two times a day (BID) | ORAL | Status: DC
Start: 2022-06-01 — End: 2022-06-03
  Administered 2022-06-01 – 2022-06-02 (×2): 100 mg via ORAL
  Filled 2022-06-01 (×2): qty 1

## 2022-06-01 MED ORDER — DIPHENHYDRAMINE HCL 12.5 MG/5ML PO ELIX: 12.5000 mg | ORAL_SOLUTION | ORAL | Status: DC | PRN

## 2022-06-01 MED ORDER — BUPIVACAINE-EPINEPHRINE (PF) 0.25% -1:200000 IJ SOLN
INTRAMUSCULAR | Status: AC
  Filled 2022-06-01: qty 30

## 2022-06-01 MED ORDER — ONDANSETRON HCL 4 MG/2ML IJ SOLN
INTRAMUSCULAR | Status: DC | PRN
  Administered 2022-06-01: 4 mg via INTRAVENOUS

## 2022-06-01 MED ORDER — ROCURONIUM BROMIDE 10 MG/ML (PF) SYRINGE
PREFILLED_SYRINGE | INTRAVENOUS | Status: AC
  Filled 2022-06-01: qty 10

## 2022-06-01 MED ORDER — FENTANYL CITRATE (PF) 100 MCG/2ML IJ SOLN
25.0000 ug | INTRAMUSCULAR | Status: DC | PRN
  Administered 2022-06-01: 50 ug via INTRAVENOUS

## 2022-06-01 MED ORDER — POLYETHYLENE GLYCOL 3350 17 G PO PACK: 17.0000 g | PACK | Freq: Every day | ORAL | Status: DC | PRN

## 2022-06-01 MED ORDER — BUPIVACAINE HCL (PF) 0.5 % IJ SOLN
INTRAMUSCULAR | Status: DC | PRN
  Administered 2022-06-01: 15 mL via PERINEURAL

## 2022-06-01 MED ORDER — GLYCOPYRROLATE PF 0.2 MG/ML IJ SOSY
PREFILLED_SYRINGE | INTRAMUSCULAR | Status: AC
Start: 2022-06-01 — End: ?
  Filled 2022-06-01: qty 1

## 2022-06-01 MED ORDER — CEFAZOLIN SODIUM-DEXTROSE 2-4 GM/100ML-% IV SOLN
2.0000 g | INTRAVENOUS | Status: AC
  Administered 2022-06-01: 2 g via INTRAVENOUS
  Filled 2022-06-01: qty 100

## 2022-06-01 MED ORDER — ONDANSETRON HCL 4 MG/2ML IJ SOLN: 4.0000 mg | Freq: Once | INTRAMUSCULAR | Status: DC | PRN

## 2022-06-01 MED ORDER — FENTANYL CITRATE (PF) 100 MCG/2ML IJ SOLN
INTRAMUSCULAR | Status: AC
  Filled 2022-06-01: qty 2

## 2022-06-01 MED ORDER — SODIUM CHLORIDE 0.9 % IR SOLN
Status: DC | PRN
  Administered 2022-06-01: 1000 mL

## 2022-06-01 MED ORDER — OXYCODONE HCL 5 MG PO TABS
10.0000 mg | ORAL_TABLET | ORAL | Status: DC | PRN
  Administered 2022-06-02: 15 mg via ORAL
  Filled 2022-06-01: qty 3

## 2022-06-01 MED ORDER — ACETAMINOPHEN 500 MG PO TABS
1000.0000 mg | ORAL_TABLET | Freq: Three times a day (TID) | ORAL | Status: AC
  Administered 2022-06-01 – 2022-06-02 (×4): 1000 mg via ORAL
  Filled 2022-06-01 (×3): qty 2

## 2022-06-01 MED ORDER — SUFENTANIL CITRATE 50 MCG/ML IV SOLN
INTRAVENOUS | Status: AC
  Filled 2022-06-01: qty 1

## 2022-06-01 MED ORDER — MENTHOL 3 MG MT LOZG: 1.0000 | LOZENGE | OROMUCOSAL | Status: DC | PRN

## 2022-06-01 MED ORDER — METHOCARBAMOL 1000 MG/10ML IJ SOLN
500.0000 mg | Freq: Four times a day (QID) | INTRAVENOUS | Status: DC | PRN
  Filled 2022-06-01: qty 5

## 2022-06-01 MED ORDER — GLYCOPYRROLATE 0.2 MG/ML IJ SOLN
INTRAMUSCULAR | Status: DC | PRN
  Administered 2022-06-01: .2 mg via INTRAVENOUS

## 2022-06-01 MED ORDER — BISACODYL 10 MG RE SUPP: 10.0000 mg | Freq: Every day | RECTAL | Status: DC | PRN

## 2022-06-01 MED ORDER — PHENYLEPHRINE HCL (PRESSORS) 10 MG/ML IV SOLN
INTRAVENOUS | Status: DC | PRN
  Administered 2022-06-01: 80 ug via INTRAVENOUS

## 2022-06-01 MED ORDER — VANCOMYCIN HCL 1000 MG IV SOLR
INTRAVENOUS | Status: AC
Start: 2022-06-01 — End: ?
  Filled 2022-06-01: qty 20

## 2022-06-01 MED ORDER — ACETAMINOPHEN 500 MG PO TABS
1000.0000 mg | ORAL_TABLET | Freq: Once | ORAL | Status: AC
  Administered 2022-06-01: 1000 mg via ORAL
  Filled 2022-06-01: qty 2

## 2022-06-01 MED ORDER — OXYCODONE HCL 5 MG PO TABS
5.0000 mg | ORAL_TABLET | ORAL | Status: DC | PRN
  Administered 2022-06-01: 5 mg via ORAL
  Administered 2022-06-02: 10 mg via ORAL
  Filled 2022-06-01: qty 2
  Filled 2022-06-01: qty 1

## 2022-06-01 MED ORDER — AMLODIPINE BESYLATE 10 MG PO TABS
10.0000 mg | ORAL_TABLET | Freq: Every day | ORAL | Status: DC
Start: 2022-06-01 — End: 2022-06-03
  Administered 2022-06-01 – 2022-06-02 (×2): 10 mg via ORAL
  Filled 2022-06-01 (×2): qty 1

## 2022-06-01 MED ORDER — METOPROLOL SUCCINATE ER 50 MG PO TB24
100.0000 mg | ORAL_TABLET | Freq: Every day | ORAL | Status: DC
  Administered 2022-06-01: 100 mg via ORAL
  Filled 2022-06-01: qty 2

## 2022-06-01 MED ORDER — PHENYLEPHRINE HCL-NACL 20-0.9 MG/250ML-% IV SOLN
INTRAVENOUS | Status: DC | PRN
  Administered 2022-06-01: 25 ug/min via INTRAVENOUS

## 2022-06-01 MED ORDER — METHOCARBAMOL 500 MG PO TABS: 500.0000 mg | ORAL_TABLET | Freq: Four times a day (QID) | ORAL | Status: DC | PRN

## 2022-06-01 MED ORDER — ONDANSETRON HCL 4 MG/2ML IJ SOLN: 4.0000 mg | Freq: Four times a day (QID) | INTRAMUSCULAR | Status: DC | PRN

## 2022-06-01 MED ORDER — MIDAZOLAM HCL 2 MG/2ML IJ SOLN
INTRAMUSCULAR | Status: DC | PRN
  Administered 2022-06-01: 2 mg via INTRAVENOUS

## 2022-06-01 MED ORDER — SODIUM CHLORIDE 0.9 % IV SOLN: INTRAVENOUS | Status: DC | PRN

## 2022-06-01 MED ORDER — PHENYLEPHRINE HCL-NACL 20-0.9 MG/250ML-% IV SOLN: INTRAVENOUS | Status: DC | PRN

## 2022-06-01 MED ORDER — OXYCODONE HCL 5 MG PO TABS: 5.0000 mg | ORAL_TABLET | Freq: Once | ORAL | Status: DC | PRN

## 2022-06-01 MED ORDER — ACETAMINOPHEN 500 MG PO TABS
1000.0000 mg | ORAL_TABLET | Freq: Once | ORAL | Status: AC
Start: 2022-06-01 — End: 2022-06-01

## 2022-06-01 MED ORDER — PHENOL 1.4 % MT LIQD: 1.0000 | OROMUCOSAL | Status: DC | PRN

## 2022-06-01 SURGICAL SUPPLY — 71 items
AID PSTN UNV HD RSTRNT DISP (MISCELLANEOUS) ×1
APL PRP STRL LF DISP 70% ISPRP (MISCELLANEOUS) ×3
BAG COUNTER SPONGE SURGICOUNT (BAG) ×2 IMPLANT
BAG SPNG CNTER NS LX DISP (BAG) ×1
BASEPLATE GLENOID RSA 3X25 0D (Shoulder) ×1 IMPLANT
BIT DRILL 3.2 PERIPHERAL SCREW (BIT) ×1 IMPLANT
BLADE SAW SGTL 73X25 THK (BLADE) ×2 IMPLANT
BSPLAT GLND +3 25 (Shoulder) ×1 IMPLANT
CHLORAPREP W/TINT 26 (MISCELLANEOUS) ×5 IMPLANT
COOLER ICEMAN CLASSIC (MISCELLANEOUS) ×1 IMPLANT
COVER SURGICAL LIGHT HANDLE (MISCELLANEOUS) ×2 IMPLANT
DRAPE C-ARM 42X72 X-RAY (DRAPES) IMPLANT
DRAPE HALF SHEET 40X57 (DRAPES) ×2 IMPLANT
DRAPE INCISE IOBAN 66X45 STRL (DRAPES) ×4 IMPLANT
DRAPE ORTHO SPLIT 77X108 STRL (DRAPES) ×4
DRAPE SURG ORHT 6 SPLT 77X108 (DRAPES) ×2 IMPLANT
DRAPE SWITCH (DRAPES) ×2 IMPLANT
DRAPE U-SHAPE 47X51 STRL (DRAPES) IMPLANT
DRSG AQUACEL AG ADV 3.5X 6 (GAUZE/BANDAGES/DRESSINGS) ×2 IMPLANT
ELECT REM PT RETURN 9FT ADLT (ELECTROSURGICAL) ×2
ELECTRODE REM PT RTRN 9FT ADLT (ELECTROSURGICAL) ×1 IMPLANT
GAUZE XEROFORM 5X9 LF (GAUZE/BANDAGES/DRESSINGS) ×1 IMPLANT
GLOVE BIO SURGEON STRL SZ 6.5 (GLOVE) ×4 IMPLANT
GLOVE BIOGEL PI IND STRL 8 (GLOVE) ×1 IMPLANT
GLOVE BIOGEL PI INDICATOR 8 (GLOVE) ×3
GLOVE ECLIPSE 8.0 STRL XLNG CF (GLOVE) ×6 IMPLANT
GLOVE INDICATOR 6.5 STRL GRN (GLOVE) ×2 IMPLANT
GOWN STRL REUS W/ TWL LRG LVL3 (GOWN DISPOSABLE) ×1 IMPLANT
GOWN STRL REUS W/ TWL XL LVL3 (GOWN DISPOSABLE) IMPLANT
GOWN STRL REUS W/TWL LRG LVL3 (GOWN DISPOSABLE) ×4
GOWN STRL REUS W/TWL XL LVL3 (GOWN DISPOSABLE) ×4
GUIDE PIN 3X75 SHOULDER (PIN) ×2
GUIDEWIRE GLENOID 2.5X220 (WIRE) ×1 IMPLANT
HANDPIECE INTERPULSE COAX TIP (DISPOSABLE) ×2
INSERT REVERSED HUMERAL SIZE 1 (Orthopedic Implant) ×1 IMPLANT
KIT BASIN OR (CUSTOM PROCEDURE TRAY) ×2 IMPLANT
KIT STABILIZATION SHOULDER (MISCELLANEOUS) ×2 IMPLANT
KIT TURNOVER KIT B (KITS) ×2 IMPLANT
MANIFOLD NEPTUNE II (INSTRUMENTS) ×2 IMPLANT
NDL HYPO 25GX1X1/2 BEV (NEEDLE) IMPLANT
NDL MAYO TROCAR (NEEDLE) IMPLANT
NEEDLE HYPO 25GX1X1/2 BEV (NEEDLE) IMPLANT
NEEDLE MAYO TROCAR (NEEDLE) IMPLANT
NS IRRIG 1000ML POUR BTL (IV SOLUTION) ×2 IMPLANT
PACK SHOULDER (CUSTOM PROCEDURE TRAY) ×2 IMPLANT
PAD ARMBOARD 7.5X6 YLW CONV (MISCELLANEOUS) ×4 IMPLANT
PAD ORTHO SHOULDER 7X19 LRG (SOFTGOODS) ×1 IMPLANT
PIN GUIDE 3X75 SHOULDER (PIN) IMPLANT
RESTRAINT HEAD UNIVERSAL NS (MISCELLANEOUS) ×2 IMPLANT
SCREW 5.5X22 (Screw) ×1 IMPLANT
SCREW BONE 6.5X40 SM (Screw) ×1 IMPLANT
SCREW PERIPHERAL 42 (Screw) ×1 IMPLANT
SET HNDPC FAN SPRY TIP SCT (DISPOSABLE) ×1 IMPLANT
SLING ARM IMMOBILIZER LRG (SOFTGOODS) ×1 IMPLANT
SPHERE GLENOID LAT REV 36 (Joint) ×1 IMPLANT
SPONGE T-LAP 18X18 ~~LOC~~+RFID (SPONGE) ×2 IMPLANT
STEM HUMERAL PLUS LONG SZ2 (Orthopedic Implant) ×1 IMPLANT
STRIP CLOSURE SKIN 1/2X4 (GAUZE/BANDAGES/DRESSINGS) ×1 IMPLANT
SUCTION FRAZIER HANDLE 10FR (MISCELLANEOUS)
SUCTION TUBE FRAZIER 10FR DISP (MISCELLANEOUS) IMPLANT
SUT ETHIBOND 2 V 37 (SUTURE) ×2 IMPLANT
SUT ETHIBOND NAB CT1 #1 30IN (SUTURE) ×2 IMPLANT
SUT ETHILON 3 0 PS 1 (SUTURE) ×1 IMPLANT
SUT FIBERWIRE #5 38 CONV NDL (SUTURE) ×8
SUT MNCRL AB 3-0 PS2 18 (SUTURE) ×2 IMPLANT
SUT VIC AB 2-0 CT1 27 (SUTURE) ×2
SUT VIC AB 2-0 CT1 TAPERPNT 27 (SUTURE) ×1 IMPLANT
SUTURE FIBERWR #5 38 CONV NDL (SUTURE) ×4 IMPLANT
TOWEL GREEN STERILE (TOWEL DISPOSABLE) ×2 IMPLANT
TRAY FOLEY W/BAG SLVR 14FR (SET/KITS/TRAYS/PACK) IMPLANT
WATER STERILE IRR 1000ML POUR (IV SOLUTION) ×2 IMPLANT

## 2022-06-01 NOTE — Anesthesia Postprocedure Evaluation (Signed)
Anesthesia Post Note  Patient: Christina Vasquez  Procedure(s) Performed: REVERSE SHOULDER ARTHROPLASTY (Right: Shoulder)     Patient location during evaluation: PACU Anesthesia Type: General Level of consciousness: awake and alert Pain management: pain level controlled Vital Signs Assessment: post-procedure vital signs reviewed and stable Respiratory status: spontaneous breathing, nonlabored ventilation and respiratory function stable Cardiovascular status: blood pressure returned to baseline and stable Postop Assessment: no apparent nausea or vomiting Anesthetic complications: no   No notable events documented.  Last Vitals:  Vitals:   06/01/22 1045 06/01/22 1109  BP: (!) 164/80 (!) 160/82  Pulse: 70 80  Resp: 15   Temp: 36.6 C 36.5 C  SpO2: 100% 95%    Last Pain:  Vitals:   06/01/22 1109  TempSrc: Oral  PainSc: 3                  Lidia Collum

## 2022-06-01 NOTE — Op Note (Signed)
Orthopaedic Surgery Operative Note (CSN: [id number])  Christina Vasquez  [birth year] Date of Surgery: 06/01/2022   Diagnoses:  Degenartive joint diease RIGHT SHOULDER  Procedure: Right lateralized reverse total Shoulder Arthroplasty   Operative Finding Successful completion of planned procedure.  Essentially no subscapularis, significant contractures of the cuff itself with end-stage arthrosis of the joint.  X-ray nerve tug test was normal.  Patient's bone quality was poor in the setting of dialysis and we used a long stemmed implant.  Post-operative plan: The patient will be NWB in sling.  The patient will be will be admitted due to medical issues and pain control overnight.  DVT prophylaxis Lovenox 40 mg/day until mobilizing and then consider transition in clinic to alternative medicines.  Pain control with PRN pain medication preferring oral medicines.  Follow up plan will be scheduled in approximately 7 days for incision check and XR.  Physical therapy to start immediately.  Implants: Tornier size 2 perform humeral longstem, 0 polyethylene, 36+3 glenosphere, 25+3 baseplate with a 40 center screw and 2 peripheral locking screws Post-Op Diagnosis: Same Surgeons:Primary: Hiram Gash, MD Assistants:Caroline McBane PA-C Location: [facility] OR ROOM 06 Anesthesia: General with Exparel Interscalene Antibiotics: Ancef 2g preop, Vancomycin '1000mg'$  locally Tourniquet time: None Estimated Blood Loss: 517 Complications: None Specimens: None Implants: Implant Name Type Inv. Item Serial No. Manufacturer Lot No. LRB No. Used Action  BASEPLATE GLENOID RSA [id number] 0D - [id number] Shoulder BASEPLATE GLENOID RSA [id number] 0D [id number] [employer]  Right 1 Implanted  Glenoid Lateralized Glenosphere   [id number] [employer]  Right 1 Implanted  SCREW BONE 6.5X40 SM - [id number] Screw SCREW BONE 6.5X40 SM  [employer]  Right 1 Implanted  SCREW PERIPHERAL 42 - [id number] Screw SCREW PERIPHERAL 42  [employer]   Right 1 Implanted  SCREW 5.5X22 - [id number] Screw SCREW 5.5X22  [employer]  Right 1 Implanted  INSERT REVERSED HUMERAL SIZE 1 - [id number] Orthopedic Implant INSERT REVERSED HUMERAL SIZE 1  [employer]  Right 1 Implanted  Humeral Stem PLUS LONG    [id number] [employer]  Right 1 Implanted    Indications for Surgery:   Christina Vasquez is a 66 y.o. female with end-stage arthritis and dialysis.  Benefits and risks of operative and nonoperative management were discussed prior to surgery with patient/guardian(s) and informed consent form was completed.  Infection and need for further surgery were discussed as was prosthetic stability and cuff issues.  We additionally specifically discussed risks of axillary nerve injury, infection, periprosthetic fracture, continued pain and longevity of implants prior to beginning procedure.      Procedure:   The patient was identified in the preoperative holding area where the surgical site was marked. Block placed by anesthesia with exparel.  The patient was taken to the OR where a procedural timeout was called and the above noted anesthesia was induced.  The patient was positioned beachchair on allen table with spider arm positioner.  Preoperative antibiotics were dosed.  The patient's right shoulder was prepped and draped in the usual sterile fashion.  A second preoperative timeout was called.       Standard deltopectoral approach was performed with a #10 blade. We dissected down to the subcutaneous tissues and the cephalic vein was taken laterally with the deltoid. Clavipectoral fascia was incised in line with the incision. Deep retractors were placed. The long of the biceps tendon was identified and there was significant tenosynovitis present.  Tenodesis was performed to the pectoralis tendon with #2 Ethibond. The remaining  biceps was followed up into the rotator interval where it was released.   The subscapularis was taken down in a full thickness layer  with capsule along the humeral neck extending inferiorly around the humeral head. We continued releasing the capsule directly off of the osteophytes inferiorly all the way around the corner. This allowed [place] to dislocate the humeral head.   The humeral head had evidence of severe osteoarthritic wear with full-thickness cartilage loss and exposed subchondral bone. There was significant flattening of the humeral head.   The rotator cuff was carefully examined and noted to be irreperably torn.  The decision was confirmed that a reverse total shoulder was indicated for this patient.  There were osteophytes along the inferior humeral neck. The osteophytes were removed with an osteotome and a rongeur.  Osteophytes were removed with a rongeur and an osteotome and the anatomic neck was well visualized.     A humeral cutting guide was used extra medullary with a pin to help control version. The version was set at 20 of retroversion. Humeral osteotomy was performed with an oscillating saw. The head fragment was passed off the back table.  A cut protector plate was placed.  The subscapularis was again identified and immediately we took care to palpate the axillary nerve anteriorly and verify its position with gentle palpation as well as the tug test.  We then released the SGHL with bovie cautery prior to placing a curved mayo at the junction of the anterior glenoid well above the axillary nerve and bluntly dissecting the subscapularis from the capsule.  We then carefully protected the axillary nerve as we gently released the inferior capsule to fully mobilize the subscapularis.  An anterior deltoid retractor was then placed as well as a small Hohmann retractor superiorly.   The glenoid was inspected and had evidence of severe osteoarthritic wear with full-thickness cartilage loss and exposed subchondral bone.   The remaining labrum was removed circumferentially taking great care not to disrupt the posterior  capsule.   The glenoid drill guide was placed and used to drill a guide pin in the center, inferior position. The glenoid face was then reamed concentrically over the guide wire. The center hole was drilled over the guidepin in a near anatomic angle of version. Next the glenoid vault was drilled back to a depth of 40 mm.  We tapped and then placed a 68m size baseplate with additional 355mlateralization was selected with a 6.5 mm x 40 mm length central screw.  The base plate was screwed into the glenoid vault obtaining secure fixation. We next placed superior and inferior locking screws for additional fixation.  Next a 36 plus 3 mm glenosphere was selected and impacted onto the baseplate. The center screw was tightened.  We turned attention back to the humeral side. The cut protector was removed.  We used the perform humeral sizing block to select the appropriate size which for this patient was a 2.  We then placed our center pin and reamed over it concentrically obtaining appropriate inset.  We then used our lateralizing chisel to prepare the lateral aspect of the humerus.  At that point we selected the appropriate implant trialing a 2+ long.  Using this trial implant we trialed multiple polyethylene sizes settling on a 0 which provided good stability and range of motion without excess soft tissue tension. The offset was dialed in to match the normal anatomy. The shoulder was trialed.  There was good ROM in all planes  and the shoulder was stable with no inferior translation.  The real humeral implants were opened after again confirming sizes.  The trial was removed. #5 Fiberwire x4 sutures passed through the humeral neck for subscap repair. The humeral component was press-fit obtaining a secure fit. The joint was reduced and thoroughly irrigated with pulsatile lavage. Subscap was repaired back with #5 Fiberwire sutures through bone tunnels. Hemostasis was obtained. The deltopectoral interval was  reapproximated with #1 Ethibond. The subcutaneous tissues were closed with 2-0 Vicryl and the skin was closed with running monocryl.    The wounds were cleaned and dried and an Aquacel dressing was placed. The drapes taken down. The arm was placed into sling with abduction pillow. Patient was awakened, extubated, and transferred to the recovery room in stable condition. There were no intraoperative complications. The sponge, needle, and attention counts were  correct at the end of the case.     [name], PA-C, present and scrubbed throughout the case, critical for completion in a timely fashion, and for retraction, instrumentation, closure.

## 2022-06-01 NOTE — Progress Notes (Signed)
PHARMACY NOTE:  ANTIMICROBIAL RENAL DOSAGE ADJUSTMENT  Current antimicrobial regimen includes a mismatch between antimicrobial dosage and estimated renal function.  As per policy approved by the Pharmacy & Therapeutics and Medical Executive Committees, the antimicrobial dosage will be adjusted accordingly.  Current antimicrobial dosage:  Cefazolin 1 gm IV q6h x 3 doses  Indication: surgical prophylaxis  Renal Function:  Estimated Creatinine Clearance: 13.5 mL/min (A) (by C-G formula based on SCr of 4.8 mg/dL (H)). '[x]'$      On intermittent HD, scheduled: Usual MWF, done 7/26 as outpatient and planned 7/28 in the hospital '[]'$      On CRRT    Antimicrobial dosage has been changed to:  none  Additional comments:   Cefazolin 2gm IV given ~8am pre-op.   Post-op course was to end ~2am on 7/28.  Hemodialysis will be after that time, so the pre-op dose will provide coverage for the intended duration.    Thank you for allowing pharmacy to be a part of this patient's care.  Arty Baumgartner, [facility] 06/01/2022 12:11 PM

## 2022-06-01 NOTE — Anesthesia Procedure Notes (Signed)
Procedure Name: Intubation Date/Time: 06/01/2022 7:54 AM  Performed by: Claris Che, CRNAPre-anesthesia Checklist: Patient identified, Emergency Drugs available, Suction available, Patient being monitored and Timeout performed Patient Re-evaluated:Patient Re-evaluated prior to induction Oxygen Delivery Method: Circle system utilized Preoxygenation: Pre-oxygenation with 100% oxygen Induction Type: IV induction and Cricoid Pressure applied Ventilation: Mask ventilation without difficulty Laryngoscope Size: Mac and 4 Grade View: Grade II Tube type: Oral Tube size: 7.5 mm Number of attempts: 1 Airway Equipment and Method: Stylet Placement Confirmation: ETT inserted through vocal cords under direct vision, positive ETCO2 and breath sounds checked- equal and bilateral Secured at: 22 cm Tube secured with: Tape Dental Injury: Teeth and Oropharynx as per pre-operative assessment

## 2022-06-01 NOTE — Anesthesia Procedure Notes (Signed)
Anesthesia Regional Block: Interscalene brachial plexus block   Pre-Anesthetic Checklist: , timeout performed,  Correct Patient, Correct Site, Correct Laterality,  Correct Procedure, Correct Position, site marked,  Risks and benefits discussed,  Surgical consent,  Pre-op evaluation,  At surgeon's request and post-op pain management  Laterality: Right  Prep: chloraprep       Needles:  Injection technique: Single-shot  Needle Type: Echogenic Stimulator Needle     Needle Length: 10cm  Needle Gauge: 20     Additional Needles:   Procedures:,,,, ultrasound used (permanent image in chart),,    Narrative:  Start time: 06/01/2022 7:06 AM End time: 06/01/2022 7:11 AM Injection made incrementally with aspirations every 5 mL.  Performed by: Personally  Anesthesiologist: Lidia Collum, MD  Additional Notes: Standard monitors applied. Skin prepped. Good needle visualization with ultrasound. Injection made in 5cc increments with no resistance to injection. Patient tolerated the procedure well.

## 2022-06-01 NOTE — Transfer of Care (Signed)
Immediate Anesthesia Transfer of Care Note  Patient: Christina Vasquez  Procedure(s) Performed: REVERSE SHOULDER ARTHROPLASTY (Right: Shoulder)  Patient Location: PACU  Anesthesia Type:GA combined with regional for post-op pain  Level of Consciousness: oriented, drowsy, patient cooperative and responds to stimulation  Airway & Oxygen Therapy: Patient Spontanous Breathing and Patient connected to nasal cannula oxygen  Post-op Assessment: Report given to RN, Post -op Vital signs reviewed and stable and Patient moving all extremities X 4  Post vital signs: Reviewed and stable  Last Vitals:  Vitals Value Taken Time  BP 148/100 06/01/22 0915  Temp    Pulse 75 06/01/22 0917  Resp 12 06/01/22 0917  SpO2 100 % 06/01/22 0917  Vitals shown include unvalidated device data.  Last Pain:  Vitals:   06/01/22 0618  TempSrc: Oral  PainSc: 4       Patients Stated Pain Goal: 3 (80/99/83 3825)  Complications: No notable events documented.

## 2022-06-01 NOTE — Interval H&P Note (Signed)
All questions answered

## 2022-06-02 ENCOUNTER — Encounter (HOSPITAL_COMMUNITY): Payer: Self-pay | Admitting: Orthopaedic Surgery

## 2022-06-02 DIAGNOSIS — M19011 Primary osteoarthritis, right shoulder: Secondary | ICD-10-CM | POA: Diagnosis not present

## 2022-06-02 LAB — HEPATITIS B SURFACE ANTIGEN: Hepatitis B Surface Ag: NONREACTIVE

## 2022-06-02 LAB — HEPATITIS B CORE ANTIBODY, TOTAL: Hep B Core Total Ab: NONREACTIVE

## 2022-06-02 LAB — HEPATITIS B SURFACE ANTIBODY,QUALITATIVE: Hep B S Ab: REACTIVE — AB

## 2022-06-02 LAB — HEPATITIS C ANTIBODY: HCV Ab: NONREACTIVE

## 2022-06-02 MED ORDER — CHLORHEXIDINE GLUCONATE CLOTH 2 % EX PADS
6.0000 | MEDICATED_PAD | Freq: Every day | CUTANEOUS | Status: DC
Start: 2022-06-02 — End: 2022-06-03

## 2022-06-02 MED ORDER — ONDANSETRON HCL 4 MG PO TABS: 4.0000 mg | ORAL_TABLET | Freq: Three times a day (TID) | ORAL | 0 refills | Status: AC | PRN

## 2022-06-02 MED ORDER — ASPIRIN 81 MG PO CHEW: 81.0000 mg | CHEWABLE_TABLET | Freq: Two times a day (BID) | ORAL | 0 refills | Status: AC

## 2022-06-02 MED ORDER — ACETAMINOPHEN 500 MG PO TABS: 1000.0000 mg | ORAL_TABLET | Freq: Three times a day (TID) | ORAL | 0 refills | Status: AC

## 2022-06-02 MED ORDER — CIPROFLOXACIN HCL 500 MG PO TABS: 500.0000 mg | ORAL_TABLET | Freq: Every day | ORAL | 0 refills | Status: AC

## 2022-06-02 MED ORDER — ACETAMINOPHEN 500 MG PO TABS
ORAL_TABLET | ORAL | Status: AC
  Filled 2022-06-02: qty 2

## 2022-06-02 MED ORDER — OXYCODONE HCL 5 MG PO TABS: ORAL_TABLET | ORAL | 0 refills | Status: AC

## 2022-06-02 NOTE — Progress Notes (Signed)
D/C order noted. Pt currently receiving HD. Pt receives out-pt HD at [facility] MWF 6:40 chair time. Contacted clinic to advise them of pt's d/c today and that pt will resume care on Monday.   Melven Sartorius Renal Navigator [phone]

## 2022-06-02 NOTE — Plan of Care (Signed)
  Problem: Acute Rehab OT Goals (only OT should resolve) Goal: OT Additional ADL Goal #1 Outcome: Completed/Met Flowsheets (Taken 06/02/2022 1044) Additional ADL Goal #1: Pt will be educated and verbalize understanding of post-op shoulder surgical precautions, HEP, UE positioning techniques, and pain management techniques in order to progress right shoulder and prepare for OP therapy when MD recommends.

## 2022-06-02 NOTE — Progress Notes (Signed)
Received patient in bed, alert and oriented. Informed consent signed and in chart.  Tx duration: 3 hrs  HD treatment completed. Patient tolerated well. Fistula/Graft/HD catheter without signs and symptoms of complications. Patient transported back to the room, alert and orient and in no acute distress. Report given to bedside RN.  Total UF removed:0.1L  Medication given:none  Post HD VS: 142/63, 70, 22, 100%RA T 98.0   Post HD weight: 106.5KG

## 2022-06-02 NOTE — Evaluation (Signed)
Occupational Therapy Evaluation Patient Details Name: Christina Vasquez MRN: [id number] DOB: Jan 11, 1956 Today's Date: 06/02/2022   History of Present Illness Patient is a 66 y/o female S/P right lateralized reverse total Shoulder Arthroplasty completed on 06/01/22 by Dr. Griffin Basil.   Clinical Impression   Pt in bed upon therapy arrival and HD prepping to start in room. Patient agreeable to participate in bedside OT evaluation. Per pt's chart, she has been up and mobile in her room without difficulty. This OT provided education including shoulder precautions, proper positioning of UE in sling, proper donning technique of sling, HEP (elbow, wrist, and hand A/ROM only), and positioning of UE in sling when seated, standing, or sleeping. Pt verbalized understanding and all education and no further acute OT needs are warranted at this time.       Recommendations for follow up therapy are one component of a multi-disciplinary discharge planning process, led by the attending physician.  Recommendations may be updated based on patient status, additional functional criteria and insurance authorization.   Follow Up Recommendations  Follow physician's recommendations for discharge plan and follow up therapies    Assistance Recommended at Discharge PRN  Patient can return home with the following A little help with bathing/dressing/bathroom    Functional Status Assessment  Patient has had a recent decline in their functional status and demonstrates the ability to make significant improvements in function in a reasonable and predictable amount of time.  Equipment Recommendations  None recommended by OT       Precautions / Restrictions Precautions Precautions: Shoulder Type of Shoulder Precautions: post op right shoulder reverse total arthroplasty. Sling on at all times unless bathing/dressing, No P/ROM Rt shoulder. A/ROM Rt elbow, wrist, hand only. Shoulder Interventions: Shoulder sling/immobilizer;At all  times;Off for dressing/bathing/exercises Precaution Booklet Issued: Yes (comment) Precaution Comments: Provided verbal education and handout for shoulder precautions. Left with pt in room on tray table. Required Braces or Orthoses: Sling Restrictions Weight Bearing Restrictions: Yes RUE Weight Bearing: Non weight bearing      Mobility Bed Mobility   General bed mobility comments: Not assessed    Transfers   General transfer comment: Not assessed      Balance Overall balance assessment:  (Bedside eval. Balance not assesed)         ADL either performed or assessed with clinical judgement   ADL Overall ADL's : Needs assistance/impaired     General ADL Comments: At this time, patient requires min assist to complete bathing, dressing, toileting, and grooming tasks due to shoulder precautions. Pt reports that she has been up and moving around in room without difficulty.     Vision Baseline Vision/History: 0 No visual deficits Ability to See in Adequate Light: 0 Adequate              Pertinent Vitals/Pain Pain Assessment Pain Assessment: No/denies pain        Extremity/Trunk Assessment Upper Extremity Assessment Upper Extremity Assessment: RUE deficits/detail RUE Deficits / Details: Unable to assess shoulder due to recent surgery. A/ROM elbow, wrist, and hand full and functional. Assessed semi-reclined in bed. Edema present from upper arm to elbow. RUE: Unable to fully assess due to immobilization        Cognition Arousal/Alertness: Awake/alert Behavior During Therapy: WFL for tasks assessed/performed Overall Cognitive Status: Within Functional Limits for tasks assessed             Shoulder Instructions  See clinical impression statement above. Provided handout for shoulder precautions. Left with patient in room.  Home Living Family/patient expects to be discharged to:: Private residence Living Arrangements: Alone          Prior  Functioning/Environment Prior Level of Function : Independent/Modified Independent            OT Problem List: Decreased strength;Pain;Decreased range of motion;Decreased knowledge of use of DME or AE;Decreased knowledge of precautions;Impaired UE functional use      OT Treatment/Interventions:   Patient/family education   OT Goals(Current goals can be found in the care plan section) Acute Rehab OT Goals Patient Stated Goal: none stated Time For Goal Achievement: 06/02/22 Potential to Achieve Goals: Good  OT Frequency:  1X visit       AM-PAC OT "6 Clicks" Daily Activity     Outcome Measure Help from another person eating meals?: A Little Help from another person taking care of personal grooming?: A Lot Help from another person toileting, which includes using toliet, bedpan, or urinal?: A Lot Help from another person bathing (including washing, rinsing, drying)?: A Lot Help from another person to put on and taking off regular upper body clothing?: A Lot Help from another person to put on and taking off regular lower body clothing?: A Lot 6 Click Score: 13   End of Session Nurse Communication: Other (comment) (Nursing in room during evaluation attempting to draw blood for HD)  Activity Tolerance: Patient tolerated treatment well Patient left: in bed;with call bell/phone within reach;with nursing/sitter in room  OT Visit Diagnosis: Muscle weakness (generalized) (M62.81)                Time: 7096-2836 OT Time Calculation (min): 20 min Charges:  OT General Charges $OT Visit: 1 Visit OT Evaluation $OT Eval Low Complexity: 1 Low OT Treatments $Therapeutic Activity: 8-22 mins (13')  [employer], OTR/L,CBIS  Supplemental OT - [facility] and [facility]   Damaria Stofko, Clarene Duke 06/02/2022, 10:39 AM

## 2022-06-02 NOTE — Discharge Instructions (Addendum)
Ophelia Charter MD, MPH [name], PA-C [place] [address] [phone] (tel)   [phone] (fax)   [place] may leave the operative dressing in place until your follow-up appointment. KEEP THE INCISIONS CLEAN AND DRY. There may be a small amount of fluid/bleeding leaking at the surgical site. This is normal after surgery.  If it fills with liquid or blood please call us immediately to change it for you. Use the provided ice machine or Ice packs as often as possible for the first 3-4 days, then as needed for pain relief.   Keep a layer of cloth or a shirt between your skin and the cooling unit to prevent frost bite as it can get very cold.  SHOWERING: - You may shower on Post-Op Day #2.  - The dressing is water resistant but do not scrub it as it may start to peel up.   - You may remove the sling for showering - Gently pat the area dry.  - Do not soak the shoulder in water.  - Do not go swimming in the pool or ocean until your incision has completely healed (about 4-6 weeks after surgery) - KEEP THE INCISIONS CLEAN AND DRY.  EXERCISES Wear the sling at all times  You may remove the sling for showering, but keep the arm across the chest or in a secondary sling.    Accidental/Purposeful External Rotation and shoulder flexion (reaching behind you) is to be avoided at all costs for the first month. It is ok to come out of your sling if your are sitting and have assistance for eating.   Do not lift anything heavier than 1 pound until we discuss it further in clinic.  It is normal for your fingers/hand to become more swollen after surgery and discolored from bruising.   This will resolve over the first few weeks usually after surgery. Please continue to ambulate and do not stay sitting or lying for too long.  Perform foot and wrist pumps to assist in circulation.  PHYSICAL  THERAPY - You will begin physical therapy soon after surgery (unless otherwise specified) - Please call to set up an appointment, if you do not already have one  - Let our office if there are any issues with scheduling your therapy  - You have a physical therapy appointment scheduled at [facility] (across the hall from our office) on Tuesday, August 1st at [place] [facility]) The anesthesia team may have performed a nerve block for you this is a great tool used to minimize pain.   The block may start wearing off overnight (between 8-24 hours postop) When the block wears off, your pain may go from nearly zero to the pain you would have had postop without the block. This is an abrupt transition but nothing dangerous is happening.   This can be a challenging period but utilize your as needed pain medications to try and manage this period. We suggest you use the pain medication the first night prior to going to bed, to ease this transition.  You may take an extra dose of narcotic when this happens if needed   POST-OP MEDICATIONS- Multimodal approach to pain control In general your pain will be controlled with a combination of substances.  Prescriptions unless otherwise discussed are electronically sent to your pharmacy.  This is a carefully made plan we use to minimize narcotic  use.     Acetaminophen - Non-narcotic pain medicine taken on a scheduled basis  Oxycodone - This is a strong narcotic, to be used only on an "as needed" basis for SEVERE pain. Aspirin '81mg'$  - This medicine is used to minimize the risk of blood clots after surgery. Zofran -  take as needed for nausea  Ciprofloxacin - take as directed for UTI   FOLLOW-UP If you develop a Fever (>101.5), Redness or Drainage from the surgical incision site, please call our office to arrange for an evaluation. Please call the office to schedule a follow-up appointment for a wound check, 7-10 days post-operatively.  IF YOU  HAVE ANY QUESTIONS, PLEASE FEEL FREE TO CALL OUR OFFICE.  HELPFUL INFORMATION  Your arm will be in a sling following surgery. You will be in this sling for the next 4 weeks.   You may be more comfortable sleeping in a semi-seated position the first few nights following surgery.  Keep a pillow propped under the elbow and forearm for comfort.  If you have a recliner type of chair it might be beneficial.  If not that is fine too, but it would be helpful to sleep propped up with pillows behind your operated shoulder as well under your elbow and forearm.  This will reduce pulling on the suture lines.  When dressing, put your operative arm in the sleeve first.  When getting undressed, take your operative arm out last.  Loose fitting, button-down shirts are recommended.  In most states it is against the law to drive while your arm is in a sling. And certainly against the law to drive while taking narcotics.  You may return to work/school in the next couple of days when you feel up to it. Desk work and typing in the sling is fine.  We suggest you use the pain medication the first night prior to going to bed, in order to ease any pain when the anesthesia wears off. You should avoid taking pain medications on an empty stomach as it will make you nauseous.  You should wean off your narcotic medicines as soon as you are able.     Most patients will be off or using minimal narcotics before their first postop appointment.   Do not drink alcoholic beverages or take illicit drugs when taking pain medications.  Pain medication may make you constipated.  Below are a few solutions to try in this order: Decrease the amount of pain medication if you aren't having pain. Drink lots of decaffeinated fluids. Drink prune juice and/or each dried prunes  If the first 3 don't work start with additional solutions Take Colace - an over-the-counter stool softener Take Senokot - an over-the-counter laxative Take Miralax  - a stronger over-the-counter laxative   Dental Antibiotics:  In most cases prophylactic antibiotics for Dental procdeures after total joint surgery are not necessary.  Exceptions are as follows:  1. History of prior total joint infection  2. Severely immunocompromised (Organ Transplant, cancer chemotherapy, Rheumatoid biologic meds such as [place])  3. Poorly controlled diabetes (A1C &gt; 8.0, blood glucose over 200)  If you have one of these conditions, contact your surgeon for an antibiotic prescription, prior to your dental procedure.   For more information including helpful videos and documents visit our website:   [email or web address]

## 2022-06-02 NOTE — Progress Notes (Signed)
   ORTHOPAEDIC PROGRESS NOTE  s/p Procedure(s): REVERSE SHOULDER ARTHROPLASTY on 06/01/22 with Dr. Griffin Basil  SUBJECTIVE: Reports mild pain about operative site. Nerve block still working. She has been out of the bed moving around. Having some issues urinating. No chest pain. No SOB. No nausea/vomiting. No other complaints.  OBJECTIVE: PE: General: NAD RUE: Dressing CDI and sling well fitting,  full and painless ROM throughout hand with DPC of 0.  Axillary nerve sensation/motor altered in setting of block and unable to be fully tested.  Distal motor and sensory altered in setting of block.  Vitals:   06/02/22 0128 06/02/22 0458  BP: 139/77 (!) 146/70  Pulse: 69 73  Resp: 18 18  Temp: 97.7 F (36.5 C) 98.7 F (37.1 C)  SpO2: 97% 100%     ASSESSMENT: Christina Vasquez is a 66 y.o. female doing well postoperatively. POD#1  PLAN: Weightbearing: NWB RUE Insicional and dressing care: Reinforce dressings as needed. New dressing placed today.  Orthopedic device(s): Sling Showering: post-op day #2 with assistance VTE prophylaxis: Aspirin BID Pain control: PRN pain medication, minimize narcotics as able Follow - up plan: 1 week in office Contact information:  Weekdays 8-5 Dr. Stana Bunting PA-C, After hours and holidays please check [email or web address] for group call information for [facility]  Dispo: OT eval today. Nephrology consult has been placed for patient's dialysis. Plan for dialysis today. Discharge home after dialysis.  Christina Chapel, PA-C 06/02/22

## 2022-06-02 NOTE — Progress Notes (Signed)
Returned from dialysis. R ankle PIV removed and discharge instructions reviewed. Pt verbalizes understanding. BP prior to d/c is 115/78. Taken to the lobby via wheelchair.

## 2022-06-02 NOTE — Procedures (Signed)
   I was present at this dialysis session, have reviewed the session itself and made  appropriate changes Kelly Splinter MD [place] pager [phone]   06/02/2022, 2:29 PM

## 2022-06-02 NOTE — Progress Notes (Signed)
Pt stable for HD no c/os avf +/+ ufg keep even

## 2022-06-02 NOTE — Consult Note (Signed)
Renal Service Consult Note [facility]  Christina Vasquez 06/02/2022 [name], MD Requesting Physician: Dr. Griffin Basil  Reason for Consult: ESRD pt sp R shoulder surgery HPI: The patient is a 66 y.o. year-old w/ hx of ESRD, HTN, OSA who was admitted for R shoulder surgery. Surgery was done yesterday 7/27. We are asked to see for dialysis.    Pt seen in room, doing well after surgery.  Wants to do HD early today if possible, her dog is boarding and needs to be picked up by 5 pm.  She denies any SOB, CP, abd pain.   ROS - no HA, no blurry vision, no rash, no diarrhea, no nausea/ vomiting, no dysuria, no difficulty voiding   Past Medical History  Past Medical History:  Diagnosis Date   Anxiety    Arthritis    Hypertension    PONV (postoperative nausea and vomiting)    Renal disorder 12/19/2018   Stage 4 ESRD. HD-MWF   Sleep apnea    Past Surgical History  Past Surgical History:  Procedure Laterality Date   ABDOMINAL HYSTERECTOMY     Family History History reviewed. No pertinent family history. Social History  reports that she has never smoked. She does not have any smokeless tobacco history on file. She reports that she does not currently use alcohol. She reports that she does not use drugs. Allergies  Allergies  Allergen Reactions   Tetracyclines & Related Hives   Home medications Prior to Admission medications   Medication Sig Start Date End Date Taking? Authorizing Provider  acetaminophen (TYLENOL) 650 MG CR tablet Take 1,300 mg by mouth every 8 (eight) hours as needed for pain.   Yes [provider]  amLODipine (NORVASC) 10 MG tablet Take 10 mg by mouth daily. 03/10/22  Yes [provider]  metoprolol succinate (TOPROL-XL) 100 MG 24 hr tablet Take 100 mg by mouth at bedtime. 03/10/22  Yes [provider]  MOUNJARO 2.5 MG/0.5ML Pen Inject 2.5 mg into the skin every Saturday. 05/08/22  Yes [provider]  sucroferric  oxyhydroxide (VELPHORO) 500 MG chewable tablet Chew 1,000 mg by mouth 3 (three) times daily with meals.   Yes [provider]  venlafaxine (EFFEXOR) 37.5 MG tablet Take 37.5 mg by mouth daily. 12/01/21  Yes [provider]     Vitals:   06/01/22 1639 06/01/22 2040 06/02/22 0128 06/02/22 0458  BP: 140/77 (!) 155/67 139/77 (!) 146/70  Pulse: 68 71 69 73  Resp: '18 16 18 18  '$ Temp: 98 F (36.7 C) 98.5 F (36.9 C) 97.7 F (36.5 C) 98.7 F (37.1 C)  TempSrc: Oral Oral Oral   SpO2: 98% 100% 97% 100%  Weight:      Height:       Exam Gen alert, no distress No rash, cyanosis or gangrene Sclera anicteric, throat clear  No jvd or bruits Chest clear bilat to bases, no rales/ wheezing RRR no MRG Abd soft ntnd no mass or ascites +bs GU normal MS R shoulder bandaged, R arm in sling Ext no LE or UE edema, no wounds or ulcers Neuro is alert, Ox 3 , nf    LFA AVF+bruit    Home meds include - amlodipine 10, metoprolol xl 100 hx, sucroferric oxyhydroxide 2 ac tid, mounjaro 2.'5mg'$  sq weekly venlafaxine, prns/ supps/ vits    OP HD: MWF [facility]  3.5h  450/1.5  106kg  2/2 bath  P3  Heparin 5000  LFA AVF - last HD 7/26,  post wt 106.5 - last Hb 10.0 - mircera 50 q2 - calcitriol 0.75 ug tiw po w hd - cinacalcet '30mg'$  po tiw w hd   Assessment/ Plan: SP R reverse shoulder arthroplasty - per ortho ESRD - on HD MWF.  HD today. Pt states she is going home after dialysis today.  HTN - bp's stable, cont meds. Under dry wt, keep even on HD today.  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

## 2022-06-03 LAB — HEPATITIS B SURFACE ANTIBODY, QUANTITATIVE: Hep B S AB Quant (Post): 69.3 m[IU]/mL (ref >9.9)

## 2022-06-06 NOTE — Discharge Summary (Signed)
Patient ID: Jaedah Lords MRN: [id number] DOB/AGE: 08-Jul-1956 66 y.o.  Admit date: 06/01/2022 Discharge date: 06/02/2022  Admission Diagnoses: Degenartive joint diease RIGHT SHOULDER  Discharge Diagnoses:  Principal Problem:   Status post reverse total arthroplasty of right shoulder   Past Medical History:  Diagnosis Date   Anxiety    Arthritis    Hypertension    PONV (postoperative nausea and vomiting)    Renal disorder 12/19/2018   Stage 4 ESRD. HD-MWF   Sleep apnea    Procedures Performed: Right lateralized reverse total Shoulder Arthroplasty  Discharged Condition: stable  Hospital Course: Patient brought in as an outpatient for surgery.  She tolerated procedure well.  She was kept for monitoring overnight for pain control, medical monitoring postop, OT, and dialysis. She was found to be stable for DC home the morning after surgery. She had dialysis scheduled at the hospital prior to discharge. She did well with OT. Patient was instructed on specific activity restrictions and all questions were answered.  Consults: Nephrology, OT  Significant Diagnostic Studies: No additional pertinent studies  Treatments: Surgery  Discharge Exam: General: NAD RUE: Dressing CDI and sling well fitting,  full and painless ROM throughout hand with DPC of 0.  Axillary nerve sensation/motor altered in setting of block and unable to be fully tested.  Distal motor and sensory altered in setting of block.  Disposition: Discharge disposition: 01-Home or Self Care       Discharge Instructions     Call MD for:  redness, tenderness, or signs of infection (pain, swelling, redness, odor or green/yellow discharge around incision site)   Complete by: As directed    Call MD for:  severe uncontrolled pain   Complete by: As directed    Call MD for:  temperature >100.4   Complete by: As directed    Diet - low sodium heart healthy   Complete by: As directed       Allergies as of 06/02/2022        Reactions   Tetracyclines & Related Hives        Medication List     STOP taking these medications    acetaminophen 650 MG CR tablet Commonly known as: TYLENOL Replaced by: acetaminophen 500 MG tablet       TAKE these medications    acetaminophen 500 MG tablet Commonly known as: TYLENOL Take 2 tablets (1,000 mg total) by mouth every 8 (eight) hours for 14 days. Replaces: acetaminophen 650 MG CR tablet   amLODipine 10 MG tablet Commonly known as: NORVASC Take 10 mg by mouth daily.   aspirin 81 MG chewable tablet Commonly known as: Aspirin Childrens Chew 1 tablet (81 mg total) by mouth 2 (two) times daily. For 6 weeks for DVT prophylaxis after surgery   metoprolol succinate 100 MG 24 hr tablet Commonly known as: TOPROL-XL Take 100 mg by mouth at bedtime.   Mounjaro 2.5 MG/0.5ML Pen Generic drug: tirzepatide Inject 2.5 mg into the skin every Saturday.   ondansetron 4 MG tablet Commonly known as: Zofran Take 1 tablet (4 mg total) by mouth every 8 (eight) hours as needed for up to 7 days for nausea or vomiting.   oxyCODONE 5 MG immediate release tablet Commonly known as: Oxy IR/ROXICODONE Take 1-2 pills every 6 hrs as needed for severe pain, no more than 6 per day   Velphoro 500 MG chewable tablet Generic drug: sucroferric oxyhydroxide Chew 1,000 mg by mouth 3 (three) times daily with meals.   venlafaxine  37.5 MG tablet Commonly known as: EFFEXOR Take 37.5 mg by mouth daily.       ASK your doctor about these medications    ciprofloxacin 500 MG tablet Commonly known as: CIPRO Take 1 tablet (500 mg total) by mouth daily with breakfast for 3 days. Ask about: Should I take this medication?        [name], PA-C

## 2022-06-08 ENCOUNTER — Other Ambulatory Visit (HOSPITAL_COMMUNITY): Payer: Self-pay | Admitting: Orthopaedic Surgery

## 2022-06-08 ENCOUNTER — Ambulatory Visit (HOSPITAL_COMMUNITY)
Admission: RE | Admit: 2022-06-08 | Discharge: 2022-06-08 | Disposition: A | Discharge status: Home / Self Care | Payer: Medicare Other | Source: Ambulatory Visit | Attending: Orthopaedic Surgery | Admitting: Orthopaedic Surgery

## 2022-06-08 DIAGNOSIS — M7989 Other specified soft tissue disorders: Secondary | ICD-10-CM | POA: Diagnosis not present

## 2022-06-08 DIAGNOSIS — M79604 Pain in right leg: Secondary | ICD-10-CM

## 2022-06-08 NOTE — Progress Notes (Signed)
Right LE venous duplex study completed. Please see CV Proc for preliminary results.  Bobetta Lime BS, RVT 06/08/2022 5:37 PM

## 2022-06-29 ENCOUNTER — Other Ambulatory Visit: Payer: Self-pay | Admitting: Orthopaedic Surgery

## 2022-06-29 DIAGNOSIS — Z01818 Encounter for other preprocedural examination: Secondary | ICD-10-CM

## 2022-07-06 ENCOUNTER — Other Ambulatory Visit: Payer: Self-pay | Admitting: Orthopaedic Surgery

## 2022-07-06 ENCOUNTER — Ambulatory Visit
Admission: RE | Admit: 2022-07-06 | Discharge: 2022-07-06 | Disposition: A | Discharge status: Home / Self Care | Payer: Medicare Other | Source: Ambulatory Visit | Attending: Orthopaedic Surgery | Admitting: Orthopaedic Surgery

## 2022-07-06 DIAGNOSIS — Z01818 Encounter for other preprocedural examination: Secondary | ICD-10-CM

## 2022-08-24 ENCOUNTER — Other Ambulatory Visit: Payer: Self-pay | Admitting: Family Medicine

## 2022-08-24 DIAGNOSIS — Z13820 Encounter for screening for osteoporosis: Secondary | ICD-10-CM

## 2022-08-25 ENCOUNTER — Other Ambulatory Visit: Payer: Self-pay | Admitting: Family Medicine

## 2022-08-25 DIAGNOSIS — Z13820 Encounter for screening for osteoporosis: Secondary | ICD-10-CM

## 2022-08-25 DIAGNOSIS — Z1231 Encounter for screening mammogram for malignant neoplasm of breast: Secondary | ICD-10-CM

## 2022-09-11 ENCOUNTER — Ambulatory Visit
Admission: RE | Admit: 2022-09-11 | Discharge: 2022-09-11 | Disposition: A | Discharge status: Home / Self Care | Payer: Medicare Other | Source: Ambulatory Visit | Attending: Family Medicine | Admitting: Family Medicine

## 2022-09-11 DIAGNOSIS — Z1231 Encounter for screening mammogram for malignant neoplasm of breast: Secondary | ICD-10-CM

## 2022-09-11 NOTE — Pre-Procedure Instructions (Signed)
Surgical Instructions    Your procedure is scheduled on September 21, 2022.  Report to [facility] Main Entrance "A" at 5:30 A.M., then check in with the Admitting office.  Call this number if you have problems the morning of surgery:  [phone]   If you have any questions prior to your surgery date call [phone]: Open Monday-Friday 8am-4pm    Remember:  Do not eat after midnight the night before your surgery  You may drink clear liquids until 4:30 AM the morning of your surgery.   Clear liquids allowed are: Water, Non-Citrus Juices (without pulp), Carbonated Beverages, Clear Tea, Black Coffee Only (NO MILK, CREAM OR POWDERED CREAMER of any kind), and Gatorade.     Take these medicines the morning of surgery with A SIP OF WATER:  amLODipine (NORVASC)   venlafaxine (EFFEXOR)   acetaminophen (TYLENOL) - may take if needed   STOP taking your [facility] one week prior to your surgery.   As of today, STOP taking any Aspirin (unless otherwise instructed by your surgeon) Aleve, Naproxen, Ibuprofen, Motrin, Advil, Goody's, BC's, all herbal medications, fish oil, and all vitamins.                     Do NOT Smoke (Tobacco/Vaping) for 24 hours prior to your procedure.  If you use a CPAP at night, you may bring your mask/headgear for your overnight stay.   Contacts, glasses, piercing's, hearing aid's, dentures or partials may not be worn into surgery, please bring cases for these belongings.    For patients admitted to the hospital, discharge time will be determined by your treatment team.   Patients discharged the day of surgery will not be allowed to drive home, and someone needs to stay with them for 24 hours.  SURGICAL WAITING ROOM VISITATION Patients having surgery or a procedure may have no more than 2 support people in the waiting area - these visitors may rotate.   Children under the age of 28 must have an adult with them who is not the patient. If the patient needs to stay  at the hospital during part of their recovery, the visitor guidelines for inpatient rooms apply. Pre-op nurse will coordinate an appropriate time for 1 support person to accompany patient in pre-op.  This support person may not rotate.   Please refer to the [facility] website for the visitor guidelines for Inpatients (after your surgery is over and you are in a regular room).   Oral Hygiene is also important to reduce your risk of infection.  Remember - BRUSH YOUR TEETH THE MORNING OF SURGERY WITH YOUR REGULAR TOOTHPASTE  [place]- Preparing for Total Shoulder Arthroplasty  Before surgery, you can play an important role. Because skin is not sterile, your skin needs to be as free of germs as possible. You can reduce the number of germs on your skin by using the following products.   Benzoyl Peroxide Gel  o Reduces the number of germs present on the skin  o Applied twice a day to shoulder area starting two days before surgery   Chlorhexidine Gluconate (CHG) Soap (instructions listed above on how to wash with CHG Soap)  o An antiseptic cleaner that kills germs and bonds with the skin to continue killing germs even after washing  o Used for showering the night before surgery and morning of surgery   ==================================================================  Please follow these instructions carefully:  BENZOYL PEROXIDE 5% GEL  Please do not use if you  have an allergy to benzoyl peroxide. If your skin becomes reddened/irritated stop using the benzoyl peroxide.  Starting two days before surgery, apply as follows:  1. Apply benzoyl peroxide in the morning and at night. Apply after taking a shower. If you are not taking a shower clean entire shoulder front, back, and side along with the armpit with a clean wet washcloth.  2. Place a quarter-sized dollop on your SHOULDER and rub in thoroughly, making sure to cover the front, back, and side of your shoulder, along with the  armpit.   2 Days prior to Surgery First Dose on November 14th Morning Second Dose on November 14th Night  Day Before Surgery First Dose on November 15th Morning Night before surgery wash (entire body except face and private areas) with CHG Soap THEN Second Dose on November 15th Night   Morning of Surgery  wash BODY AGAIN with CHG Soap   4. Do NOT apply benzoyl peroxide gel on the day of surgery   [place]- Preparing For Surgery  Before surgery, you can play an important role. Because skin is not sterile, your skin needs to be as free of germs as possible. You can reduce the number of germs on your skin by washing with CHG (chlorahexidine gluconate) Soap before surgery.  CHG is an antiseptic cleaner which kills germs and bonds with the skin to continue killing germs even after washing.     Please do not use if you have an allergy to CHG or antibacterial soaps. If your skin becomes reddened/irritated stop using the CHG.  Do not shave (including legs and underarms) for at least 48 hours prior to first CHG shower. It is OK to shave your face.  Please follow these instructions carefully.     Shower the NIGHT BEFORE SURGERY and the MORNING OF SURGERY with CHG Soap.   If you chose to wash your hair, wash your hair first as usual with your normal shampoo. After you shampoo, rinse your hair and body thoroughly to remove the shampoo.  Then [employer] and genitals (private parts) with your normal soap and rinse thoroughly to remove soap.  After that Use CHG Soap as you would any other liquid soap. You can apply CHG directly to the skin and wash gently with a scrungie or a clean washcloth.   Apply the CHG Soap to your body ONLY FROM THE NECK DOWN.  Do not use on open wounds or open sores. Avoid contact with your eyes, ears, mouth and genitals (private parts). Wash Face and genitals (private parts)  with your normal soap.   Wash thoroughly, paying special attention to the area where your  surgery will be performed.  Thoroughly rinse your body with warm water from the neck down.  DO NOT shower/wash with your normal soap after using and rinsing off the CHG Soap.  Pat yourself dry with a CLEAN TOWEL.  8. Apply the Benzoyl Peroxide only the night before surgery.  Do Not use it the morning of surgery.  Wear CLEAN PAJAMAS to bed the night before surgery  Place CLEAN SHEETS on your bed the night before your surgery  DO NOT SLEEP WITH PETS.   Day of Surgery: Take a shower with CHG soap. Do not wear jewelry or makeup Do not wear lotions, powders, perfumes/colognes, or deodorant. Do not shave 48 hours prior to surgery.  Men may shave face and neck. Do not bring valuables to the hospital.  The [facility] is not responsible for any  belongings or valuables. Do not wear nail polish, gel polish, artificial nails, or any other type of covering on natural nails (fingers and toes) If you have artificial nails or gel coating that need to be removed by a nail salon, please have this removed prior to surgery. Artificial nails or gel coating may interfere with anesthesia's ability to adequately monitor your vital signs.  Wear Clean/Comfortable clothing the morning of surgery Remember to brush your teeth WITH YOUR REGULAR TOOTHPASTE.   Please read over the following fact sheets that you were given.    If you received a COVID test during your pre-op visit  it is requested that you wear a mask when out in public, stay away from anyone that may not be feeling well and notify your surgeon if you develop symptoms. If you have been in contact with anyone that has tested positive in the last 10 days please notify you surgeon.

## 2022-09-12 ENCOUNTER — Encounter (HOSPITAL_COMMUNITY): Payer: Self-pay

## 2022-09-12 ENCOUNTER — Encounter (HOSPITAL_COMMUNITY)
Admission: RE | Admit: 2022-09-12 | Discharge: 2022-09-12 | Disposition: A | Discharge status: Home / Self Care | Payer: Medicare Other | Source: Ambulatory Visit | Attending: Orthopaedic Surgery | Admitting: Orthopaedic Surgery

## 2022-09-12 ENCOUNTER — Other Ambulatory Visit: Payer: Self-pay

## 2022-09-12 VITALS — BP 130/54 | HR 84 | Temp 98.2°F | Resp 17 | Ht 62.0 in | Wt 222.7 lb

## 2022-09-12 DIAGNOSIS — Z01818 Encounter for other preprocedural examination: Secondary | ICD-10-CM

## 2022-09-12 DIAGNOSIS — Z01812 Encounter for preprocedural laboratory examination: Secondary | ICD-10-CM | POA: Insufficient documentation

## 2022-09-12 HISTORY — DX: Hyperparathyroidism, unspecified: E21.3

## 2022-09-12 LAB — CBC
HCT: 30.8 % — ABNORMAL LOW (ref 36.0–46.0)
Hemoglobin: 10 g/dL — ABNORMAL LOW (ref 12.0–15.0)
MCH: 27 pg (ref 26.0–34.0)
MCHC: 32.5 g/dL (ref 30.0–36.0)
MCV: 83.2 fL (ref 80.0–100.0)
Platelets: 192 10*3/uL (ref 150–400)
RBC: 3.7 MIL/uL — ABNORMAL LOW (ref 3.87–5.11)
RDW: 15.4 % (ref 11.5–15.5)
WBC: 4.6 10*3/uL (ref 4.0–10.5)
nRBC: 0 % (ref 0.0–0.2)

## 2022-09-12 LAB — SURGICAL PCR SCREEN
MRSA, PCR: NEGATIVE
Staphylococcus aureus: NEGATIVE

## 2022-09-12 NOTE — Progress Notes (Addendum)
PCP - Dr. Otho Ket (Primary in [place]. Has not established with new PCP since moving here) Cardiologist - Dr. Marianna Payment [place] (Saw in 2019 for work-up to be cleared to start Hemodialysis. Did not see after work-up complete) Nephrologist - Dr. Phylliss Blakes. [place] (Pt is already scheduled to have HD the day before surgery outpatient and receive HD the day after surgery as inpatient at [facility])  PPM/ICD - Denies Device Orders - n/a Rep Notified - n/a  Chest x-ray - n/a EKG - 05/29/2022 Stress Test - 2019 - results normal per patient ECHO - 05/27/2018 Cardiac Cath - Denies  Sleep Study - Yes. Positive OSA from Sleep Study in 2018 CPAP - Pt does not wear CPAP. Unable to tolerate mask.  No DM  Last dose of GLP1 agonist- Last Dose of Mounjaro 10/28. GLP1 instructions: Pt stopped Mounjaro one week sooner than our protocol. Pt instructed to continue to hold medication until after surgery.  Blood Thinner Instructions: n/a Aspirin Instructions: n/a  ERAS Protcol - Clear liquids until 0430 morning of surgery PRE-SURGERY Ensure or G2- n/a. None ordered  COVID TEST- n/a   Anesthesia review: No.  Patient denies shortness of breath, fever, cough and chest pain at PAT appointment   All instructions explained to the patient, with a verbal understanding of the material. Patient agrees to go over the instructions while at home for a better understanding. Patient also instructed to self quarantine after being tested for COVID-19. The opportunity to ask questions was provided.

## 2022-09-15 ENCOUNTER — Other Ambulatory Visit: Payer: Self-pay | Admitting: Family Medicine

## 2022-09-15 DIAGNOSIS — R928 Other abnormal and inconclusive findings on diagnostic imaging of breast: Secondary | ICD-10-CM

## 2022-09-20 NOTE — Anesthesia Preprocedure Evaluation (Signed)
Anesthesia Evaluation  Patient identified by MRN, date of birth, ID band Patient awake    Reviewed: Allergy & Precautions, NPO status , Patient's Chart, lab work & pertinent test results  History of Anesthesia Complications (+) PONV and history of anesthetic complications  Airway Mallampati: III  TM Distance: >3 FB Neck ROM: Full    Dental  (+) Dental Advisory Given   Pulmonary sleep apnea    Pulmonary exam normal        Cardiovascular hypertension, Pt. on home beta blockers and Pt. on medications Normal cardiovascular exam     Neuro/Psych  PSYCHIATRIC DISORDERS Anxiety     negative neurological ROS     GI/Hepatic negative GI ROS, Neg liver ROS,,,  Endo/Other    Morbid obesity  Renal/GU ESRF and DialysisRenal disease  negative genitourinary   Musculoskeletal negative musculoskeletal ROS (+)    Abdominal  (+) + obese  Peds  Hematology  (+) Blood dyscrasia, anemia   Anesthesia Other Findings   Reproductive/Obstetrics                             Anesthesia Physical Anesthesia Plan  ASA: 3  Anesthesia Plan: General   Post-op Pain Management: Regional block* and Tylenol PO (pre-op)*   Induction: Intravenous  PONV Risk Score and Plan: 3 and Ondansetron, Dexamethasone, Treatment may vary due to age or medical condition and Midazolam  Airway Management Planned: Oral ETT  Additional Equipment: None  Intra-op Plan:   Post-operative Plan: Extubation in OR  Informed Consent: I have reviewed the patients History and Physical, chart, labs and discussed the procedure including the risks, benefits and alternatives for the proposed anesthesia with the patient or authorized representative who has indicated his/her understanding and acceptance.     Dental advisory given  Plan Discussed with: CRNA  Anesthesia Plan Comments:         Anesthesia Quick Evaluation

## 2022-09-20 NOTE — H&P (Signed)
PREOPERATIVE H&P  Chief Complaint: left DJD shoulder  HPI: Christina Vasquez is a 66 y.o. female who presents for preoperative history and physical prior to scheduled surgery, Procedure(s): REVERSE SHOULDER ARTHROPLASTY.   Patient has a past medical history significant for stage 4 ESRD, PONV, HTN, sleep apnea.   Patient has had left shoulder pain for quite some time now. Early in the year, she was holding a door open in extension and felt sometihng tear. She has had increased pain since that time. She underwent a right reverse total shoulder arthroplasty in July of this year and has done great with this. She is ready to get her left shoulder fixed.   Symptoms are rated as moderate to severe, and have been worsening.  This is significantly impairing activities of daily living.    Please see clinic note for further details on this patient's care.    She has elected for surgical management.   Past Medical History:  Diagnosis Date   Anxiety    Arthritis    Hyperparathyroidism ([place])    Hypertension    PONV (postoperative nausea and vomiting)    Happened one time. Has had sx since then with no N/V   Renal disorder 12/19/2018   Stage 4 ESRD. HD-MWF   Sleep apnea    Past Surgical History:  Procedure Laterality Date   A/V FISTULAGRAM  2019   ABDOMINAL HYSTERECTOMY  2005   BREAST EXCISIONAL BIOPSY Left 2017   scar not visible   CHOLECYSTECTOMY  2002   COLONOSCOPY  2010   IR AV DIALY SHUNT INTRO NEEDLE/INTRAC INITIAL W/PTA/STENT/IMG LT  2021   Stent placed in Left Arm Fistula. Placed in [place] Right 06/01/2022   Procedure: REVERSE SHOULDER ARTHROPLASTY;  Surgeon: Hiram Gash, MD;  Location: [facility];  Service: Orthopedics;  Laterality: Right;   TOTAL KNEE ARTHROPLASTY Right 2008   Social History   Socioeconomic History   Marital status: Widowed    Spouse name: Not on file   Number of children: Not on file   Years of education: Not on file    Highest education level: Not on file  Occupational History   Not on file  Tobacco Use   Smoking status: Never   Smokeless tobacco: Not on file  Vaping Use   Vaping Use: Never used  Substance and Sexual Activity   Alcohol use: Not Currently    Comment: rarely   Drug use: Never   Sexual activity: Not Currently  Other Topics Concern   Not on file  Social History Narrative   Not on file   Social Determinants of Health   Financial Resource Strain: Not on file  Food Insecurity: Not on file  Transportation Needs: Not on file  Physical Activity: Not on file  Stress: Not on file  Social Connections: Not on file   Family History  Problem Relation Age of Onset   Breast cancer Sister    Breast cancer Cousin        maternal first cousin   Allergies  Allergen Reactions   Tetracyclines & Related Hives   Prior to Admission medications   Medication Sig Start Date End Date Taking? Authorizing Provider  acetaminophen (TYLENOL) 500 MG tablet Take 1,000 mg by mouth every 6 (six) hours as needed for moderate pain.   Yes [provider]  amLODipine (NORVASC) 10 MG tablet Take 10 mg by mouth daily. 03/10/22  Yes [provider]  metoprolol succinate (TOPROL-XL)  100 MG 24 hr tablet Take 100 mg by mouth at bedtime. 03/10/22  Yes [provider]  MOUNJARO 2.5 MG/0.5ML Pen Inject 2.5 mg into the skin every Saturday. 05/08/22  Yes [provider]  sucroferric oxyhydroxide (VELPHORO) 500 MG chewable tablet Chew 500-1,000 mg by mouth See admin instructions. Take 1000 mg by mouth daily with meals and take 500 mg with snacks   Yes [provider]  venlafaxine (EFFEXOR) 37.5 MG tablet Take 37.5 mg by mouth daily. 12/01/21  Yes [provider]  cinacalcet (SENSIPAR) 60 MG tablet Take 60 mg by mouth every Monday, Wednesday, and Friday. Taken after Hemodialysis    [provider]    ROS: All other systems have been reviewed and were otherwise  negative with the exception of those mentioned in the HPI and as above.  Physical Exam: General: Alert, no acute distress Cardiovascular: No pedal edema Respiratory: No cyanosis, no use of accessory musculature GI: No organomegaly, abdomen is soft and non-tender Skin: No lesions in the area of chief complaint Neurologic: Sensation intact distally Psychiatric: Patient is competent for consent with normal mood and affect Lymphatic: No axillary or cervical lymphadenopathy  MUSCULOSKELETAL:  Left shoulder ROM to about 90 degrees.   The left shoulder external rotation to 10 degrees. Cuff strength is weak throughout.  Imaging: MRI and X-rays reviewed of the bilateral shoulders and shows severe glenohumeral arthritis. On the right side she has significant bone loss in the glenoid as well.   BMI: Estimated body mass index is 40.73 kg/m as calculated from the following:   Height as of 09/12/22: '5\' 2"'$  (1.575 m).   Weight as of 09/12/22: 101 kg.  No results found for: "ALBUMIN" Diabetes: Patient does not have a diagnosis of diabetes.     Smoking Status:   reports that she has never smoked. She does not have any smokeless tobacco history on file.    Assessment: left DJD shoulder  Plan: Plan for Procedure(s): REVERSE SHOULDER ARTHROPLASTY   The risks benefits and alternatives were discussed with the patient including but not limited to the risks of nonoperative treatment, versus surgical intervention including infection, bleeding, nerve injury,  blood clots, cardiopulmonary complications, morbidity, mortality, among others, and they were willing to proceed.   We additionally specifically discussed risks of axillary nerve injury, infection, periprosthetic fracture, continued pain and longevity of implants prior to beginning procedure.    Patient will be admitted for inpatient treatment for surgery, pain control, OT, prophylactic antibiotics, VTE prophylaxis, and discharge planning. The  patient is planning to be discharged home with outpatient PT.   The patient acknowledged the explanation, agreed to proceed with the plan and consent was signed.   She received operative clearance from her PCP and nephrologist. She will be admitted overnight with a plan for dialysis in the morning after surgery.   Operative Plan: Left reverse total shoulder arthroplasty Discharge Medications: Standard DVT Prophylaxis: Aspirin Physical Therapy: Outpatient PT Special Discharge needs: Sling.   Ethelda Chick, PA-C  09/20/2022 6:18 PM

## 2022-09-21 ENCOUNTER — Observation Stay (HOSPITAL_COMMUNITY): Payer: Medicare Other

## 2022-09-21 ENCOUNTER — Other Ambulatory Visit: Payer: Self-pay

## 2022-09-21 ENCOUNTER — Ambulatory Visit (HOSPITAL_COMMUNITY): Payer: Medicare Other | Admitting: Anesthesiology

## 2022-09-21 ENCOUNTER — Ambulatory Visit (HOSPITAL_BASED_OUTPATIENT_CLINIC_OR_DEPARTMENT_OTHER): Payer: Medicare Other | Admitting: Anesthesiology

## 2022-09-21 ENCOUNTER — Encounter (HOSPITAL_COMMUNITY): Payer: Self-pay | Admitting: Orthopaedic Surgery

## 2022-09-21 ENCOUNTER — Encounter (HOSPITAL_COMMUNITY)
Admission: RE | Disposition: A | Discharge status: Home / Self Care | Payer: Self-pay | Source: Home / Self Care | Attending: Orthopaedic Surgery

## 2022-09-21 ENCOUNTER — Observation Stay (HOSPITAL_COMMUNITY)
Admission: RE | Admit: 2022-09-21 | Discharge: 2022-09-22 | Disposition: A | Discharge status: Home / Self Care | Payer: Medicare Other | Attending: Orthopaedic Surgery | Admitting: Orthopaedic Surgery

## 2022-09-21 DIAGNOSIS — M75102 Unspecified rotator cuff tear or rupture of left shoulder, not specified as traumatic: Secondary | ICD-10-CM

## 2022-09-21 DIAGNOSIS — N186 End stage renal disease: Secondary | ICD-10-CM | POA: Insufficient documentation

## 2022-09-21 DIAGNOSIS — Z96651 Presence of right artificial knee joint: Secondary | ICD-10-CM | POA: Insufficient documentation

## 2022-09-21 DIAGNOSIS — Z992 Dependence on renal dialysis: Secondary | ICD-10-CM | POA: Insufficient documentation

## 2022-09-21 DIAGNOSIS — Z79899 Other long term (current) drug therapy: Secondary | ICD-10-CM | POA: Diagnosis not present

## 2022-09-21 DIAGNOSIS — Z96611 Presence of right artificial shoulder joint: Secondary | ICD-10-CM | POA: Insufficient documentation

## 2022-09-21 DIAGNOSIS — D631 Anemia in chronic kidney disease: Secondary | ICD-10-CM

## 2022-09-21 DIAGNOSIS — M19012 Primary osteoarthritis, left shoulder: Secondary | ICD-10-CM

## 2022-09-21 DIAGNOSIS — E039 Hypothyroidism, unspecified: Secondary | ICD-10-CM | POA: Diagnosis not present

## 2022-09-21 DIAGNOSIS — I120 Hypertensive chronic kidney disease with stage 5 chronic kidney disease or end stage renal disease: Secondary | ICD-10-CM

## 2022-09-21 DIAGNOSIS — Z96612 Presence of left artificial shoulder joint: Secondary | ICD-10-CM

## 2022-09-21 HISTORY — PX: REVERSE SHOULDER ARTHROPLASTY: SHX5054

## 2022-09-21 LAB — POCT I-STAT, CHEM 8
BUN: 28 mg/dL — ABNORMAL HIGH (ref 8–23)
Calcium, Ion: 1.11 mmol/L — ABNORMAL LOW (ref 1.15–1.40)
Chloride: 93 mmol/L — ABNORMAL LOW (ref 98–111)
Creatinine, Ser: 5.4 mg/dL — ABNORMAL HIGH (ref 0.44–1.00)
Glucose, Bld: 97 mg/dL (ref 70–99)
HCT: 30 % — ABNORMAL LOW (ref 36.0–46.0)
Hemoglobin: 10.2 g/dL — ABNORMAL LOW (ref 12.0–15.0)
Potassium: 3.6 mmol/L (ref 3.5–5.1)
Sodium: 136 mmol/L (ref 135–145)
TCO2: 31 mmol/L (ref 22–32)

## 2022-09-21 LAB — HEPATITIS B SURFACE ANTIGEN: Hepatitis B Surface Ag: NONREACTIVE

## 2022-09-21 SURGERY — ARTHROPLASTY, SHOULDER, TOTAL, REVERSE
Anesthesia: General | Site: Shoulder | Laterality: Left

## 2022-09-21 MED ORDER — METHOCARBAMOL 500 MG PO TABS
500.0000 mg | ORAL_TABLET | Freq: Four times a day (QID) | ORAL | Status: DC | PRN
  Administered 2022-09-22: 500 mg via ORAL
  Filled 2022-09-21: qty 1

## 2022-09-21 MED ORDER — ONDANSETRON HCL 4 MG/2ML IJ SOLN: 4.0000 mg | Freq: Once | INTRAMUSCULAR | Status: DC | PRN

## 2022-09-21 MED ORDER — BUPIVACAINE LIPOSOME 1.3 % IJ SUSP
INTRAMUSCULAR | Status: DC | PRN
  Administered 2022-09-21 (×5): 2 mL via PERINEURAL

## 2022-09-21 MED ORDER — ACETAMINOPHEN 160 MG/5ML PO SOLN: 325.0000 mg | ORAL | Status: DC | PRN

## 2022-09-21 MED ORDER — 0.9 % SODIUM CHLORIDE (POUR BTL) OPTIME
TOPICAL | Status: DC | PRN
  Administered 2022-09-21: 1000 mL

## 2022-09-21 MED ORDER — FENTANYL CITRATE (PF) 250 MCG/5ML IJ SOLN
INTRAMUSCULAR | Status: AC
  Filled 2022-09-21: qty 5

## 2022-09-21 MED ORDER — TRANEXAMIC ACID-NACL 1000-0.7 MG/100ML-% IV SOLN
INTRAVENOUS | Status: AC
  Filled 2022-09-21: qty 100

## 2022-09-21 MED ORDER — FENTANYL CITRATE (PF) 100 MCG/2ML IJ SOLN
25.0000 ug | INTRAMUSCULAR | Status: DC | PRN
  Administered 2022-09-21: 25 ug via INTRAVENOUS

## 2022-09-21 MED ORDER — CHLORHEXIDINE GLUCONATE CLOTH 2 % EX PADS
6.0000 | MEDICATED_PAD | Freq: Every day | CUTANEOUS | Status: DC
  Administered 2022-09-22: 6 via TOPICAL

## 2022-09-21 MED ORDER — SODIUM CHLORIDE 0.9 % IR SOLN
Status: DC | PRN
  Administered 2022-09-21: 3000 mL

## 2022-09-21 MED ORDER — LIDOCAINE 2% (20 MG/ML) 5 ML SYRINGE
INTRAMUSCULAR | Status: DC | PRN
  Administered 2022-09-21: 100 mg via INTRAVENOUS

## 2022-09-21 MED ORDER — KETOROLAC TROMETHAMINE 15 MG/ML IJ SOLN: 15.0000 mg | Freq: Once | INTRAMUSCULAR | Status: DC

## 2022-09-21 MED ORDER — OXYCODONE HCL 5 MG PO TABS: 5.0000 mg | ORAL_TABLET | Freq: Once | ORAL | Status: DC | PRN

## 2022-09-21 MED ORDER — PHENOL 1.4 % MT LIQD: 1.0000 | OROMUCOSAL | Status: DC | PRN

## 2022-09-21 MED ORDER — VANCOMYCIN HCL 1000 MG IV SOLR
INTRAVENOUS | Status: DC | PRN
  Administered 2022-09-21: 1000 mg

## 2022-09-21 MED ORDER — PHENYLEPHRINE 80 MCG/ML (10ML) SYRINGE FOR IV PUSH (FOR BLOOD PRESSURE SUPPORT)
PREFILLED_SYRINGE | INTRAVENOUS | Status: DC | PRN
  Administered 2022-09-21 (×2): 80 ug via INTRAVENOUS

## 2022-09-21 MED ORDER — METHOCARBAMOL 1000 MG/10ML IJ SOLN: 500.0000 mg | Freq: Four times a day (QID) | INTRAVENOUS | Status: DC | PRN

## 2022-09-21 MED ORDER — LACTATED RINGERS IV SOLN: INTRAVENOUS | Status: DC

## 2022-09-21 MED ORDER — ACETAMINOPHEN 500 MG PO TABS
1000.0000 mg | ORAL_TABLET | Freq: Once | ORAL | Status: AC
  Administered 2022-09-21: 1000 mg via ORAL
  Filled 2022-09-21: qty 2

## 2022-09-21 MED ORDER — BUPIVACAINE-EPINEPHRINE (PF) 0.5% -1:200000 IJ SOLN
INTRAMUSCULAR | Status: DC | PRN
  Administered 2022-09-21 (×5): 3 mL via PERINEURAL

## 2022-09-21 MED ORDER — SUGAMMADEX SODIUM 200 MG/2ML IV SOLN
INTRAVENOUS | Status: DC | PRN
  Administered 2022-09-21: 200 mg via INTRAVENOUS

## 2022-09-21 MED ORDER — ONDANSETRON HCL 4 MG/2ML IJ SOLN
INTRAMUSCULAR | Status: AC
  Filled 2022-09-21: qty 2

## 2022-09-21 MED ORDER — SODIUM CHLORIDE 0.9 % IV SOLN: INTRAVENOUS | Status: DC | PRN

## 2022-09-21 MED ORDER — OXYCODONE HCL 5 MG/5ML PO SOLN: 5.0000 mg | Freq: Once | ORAL | Status: DC | PRN

## 2022-09-21 MED ORDER — TRANEXAMIC ACID-NACL 1000-0.7 MG/100ML-% IV SOLN
1000.0000 mg | INTRAVENOUS | Status: AC
  Administered 2022-09-21: 1000 mg via INTRAVENOUS
  Filled 2022-09-21: qty 100

## 2022-09-21 MED ORDER — MIDAZOLAM HCL 2 MG/2ML IJ SOLN
INTRAMUSCULAR | Status: AC
  Filled 2022-09-21: qty 2

## 2022-09-21 MED ORDER — DIPHENHYDRAMINE HCL 12.5 MG/5ML PO ELIX: 12.5000 mg | ORAL_SOLUTION | ORAL | Status: DC | PRN

## 2022-09-21 MED ORDER — MIDAZOLAM HCL 2 MG/2ML IJ SOLN
INTRAMUSCULAR | Status: DC | PRN
  Administered 2022-09-21: 2 mg via INTRAVENOUS

## 2022-09-21 MED ORDER — CHLORHEXIDINE GLUCONATE 0.12 % MT SOLN
15.0000 mL | Freq: Once | OROMUCOSAL | Status: AC
  Administered 2022-09-21: 15 mL via OROMUCOSAL
  Filled 2022-09-21: qty 15

## 2022-09-21 MED ORDER — FENTANYL CITRATE (PF) 100 MCG/2ML IJ SOLN
INTRAMUSCULAR | Status: AC
  Filled 2022-09-21: qty 2

## 2022-09-21 MED ORDER — PROPOFOL 10 MG/ML IV BOLUS
INTRAVENOUS | Status: AC
  Filled 2022-09-21: qty 20

## 2022-09-21 MED ORDER — MEPERIDINE HCL 25 MG/ML IJ SOLN: 6.2500 mg | INTRAMUSCULAR | Status: DC | PRN

## 2022-09-21 MED ORDER — OXYCODONE HCL 5 MG PO TABS
10.0000 mg | ORAL_TABLET | ORAL | Status: DC | PRN
  Administered 2022-09-22: 15 mg via ORAL
  Filled 2022-09-21: qty 3

## 2022-09-21 MED ORDER — CEFAZOLIN SODIUM-DEXTROSE 1-4 GM/50ML-% IV SOLN
1.0000 g | INTRAVENOUS | Status: DC
  Administered 2022-09-22: 1 g via INTRAVENOUS
  Filled 2022-09-21: qty 50

## 2022-09-21 MED ORDER — CINACALCET HCL 30 MG PO TABS
60.0000 mg | ORAL_TABLET | ORAL | Status: DC
  Administered 2022-09-22: 60 mg via ORAL
  Filled 2022-09-21: qty 2

## 2022-09-21 MED ORDER — ORAL CARE MOUTH RINSE: 15.0000 mL | Freq: Once | OROMUCOSAL | Status: AC

## 2022-09-21 MED ORDER — DEXAMETHASONE SODIUM PHOSPHATE 10 MG/ML IJ SOLN
INTRAMUSCULAR | Status: AC
  Filled 2022-09-21: qty 1

## 2022-09-21 MED ORDER — CINACALCET HCL 30 MG PO TABS: 60.0000 mg | ORAL_TABLET | ORAL | Status: DC

## 2022-09-21 MED ORDER — PROPOFOL 10 MG/ML IV BOLUS
INTRAVENOUS | Status: DC | PRN
  Administered 2022-09-21: 150 mg via INTRAVENOUS
  Administered 2022-09-21: 50 mg via INTRAVENOUS

## 2022-09-21 MED ORDER — AMLODIPINE BESYLATE 10 MG PO TABS
10.0000 mg | ORAL_TABLET | Freq: Every day | ORAL | Status: DC
  Administered 2022-09-22: 10 mg via ORAL
  Filled 2022-09-21: qty 1

## 2022-09-21 MED ORDER — CALCITRIOL 0.5 MCG PO CAPS
1.5000 ug | ORAL_CAPSULE | ORAL | Status: DC
  Administered 2022-09-22 (×2): 1.5 ug via ORAL
  Filled 2022-09-21: qty 3

## 2022-09-21 MED ORDER — FENTANYL CITRATE (PF) 250 MCG/5ML IJ SOLN
INTRAMUSCULAR | Status: DC | PRN
  Administered 2022-09-21 (×5): 50 ug via INTRAVENOUS

## 2022-09-21 MED ORDER — ACETAMINOPHEN 325 MG PO TABS: 325.0000 mg | ORAL_TABLET | ORAL | Status: DC | PRN

## 2022-09-21 MED ORDER — PHENYLEPHRINE HCL-NACL 20-0.9 MG/250ML-% IV SOLN
INTRAVENOUS | Status: DC | PRN
  Administered 2022-09-21: 20 ug/min via INTRAVENOUS

## 2022-09-21 MED ORDER — ONDANSETRON HCL 4 MG/2ML IJ SOLN: 4.0000 mg | Freq: Four times a day (QID) | INTRAMUSCULAR | Status: DC | PRN

## 2022-09-21 MED ORDER — ONDANSETRON HCL 4 MG PO TABS: 4.0000 mg | ORAL_TABLET | Freq: Four times a day (QID) | ORAL | Status: DC | PRN

## 2022-09-21 MED ORDER — ROCURONIUM BROMIDE 10 MG/ML (PF) SYRINGE
PREFILLED_SYRINGE | INTRAVENOUS | Status: AC
  Filled 2022-09-21: qty 10

## 2022-09-21 MED ORDER — GABAPENTIN 300 MG PO CAPS
300.0000 mg | ORAL_CAPSULE | Freq: Once | ORAL | Status: AC
  Administered 2022-09-21: 300 mg via ORAL
  Filled 2022-09-21: qty 1

## 2022-09-21 MED ORDER — SUCROFERRIC OXYHYDROXIDE 500 MG PO CHEW: 500.0000 mg | CHEWABLE_TABLET | ORAL | Status: DC

## 2022-09-21 MED ORDER — LIDOCAINE 2% (20 MG/ML) 5 ML SYRINGE
INTRAMUSCULAR | Status: AC
  Filled 2022-09-21: qty 5

## 2022-09-21 MED ORDER — DEXAMETHASONE SODIUM PHOSPHATE 10 MG/ML IJ SOLN
INTRAMUSCULAR | Status: DC | PRN
  Administered 2022-09-21: 10 mg via INTRAVENOUS

## 2022-09-21 MED ORDER — ORAL CARE MOUTH RINSE: 15.0000 mL | OROMUCOSAL | Status: DC | PRN

## 2022-09-21 MED ORDER — ROCURONIUM BROMIDE 10 MG/ML (PF) SYRINGE
PREFILLED_SYRINGE | INTRAVENOUS | Status: DC | PRN
  Administered 2022-09-21: 60 mg via INTRAVENOUS

## 2022-09-21 MED ORDER — SUCROFERRIC OXYHYDROXIDE 500 MG PO CHEW
500.0000 mg | CHEWABLE_TABLET | ORAL | Status: DC
  Filled 2022-09-21 (×4): qty 1

## 2022-09-21 MED ORDER — ACETAMINOPHEN 500 MG PO TABS
1000.0000 mg | ORAL_TABLET | Freq: Four times a day (QID) | ORAL | Status: AC
  Administered 2022-09-21 – 2022-09-22 (×4): 1000 mg via ORAL
  Filled 2022-09-21 (×4): qty 2

## 2022-09-21 MED ORDER — DOCUSATE SODIUM 100 MG PO CAPS
100.0000 mg | ORAL_CAPSULE | Freq: Two times a day (BID) | ORAL | Status: DC
  Administered 2022-09-21 (×2): 100 mg via ORAL
  Filled 2022-09-21 (×3): qty 1

## 2022-09-21 MED ORDER — MENTHOL 3 MG MT LOZG: 1.0000 | LOZENGE | OROMUCOSAL | Status: DC | PRN

## 2022-09-21 MED ORDER — CEFAZOLIN SODIUM-DEXTROSE 2-4 GM/100ML-% IV SOLN
2.0000 g | INTRAVENOUS | Status: AC
  Administered 2022-09-21: 2 g via INTRAVENOUS
  Filled 2022-09-21: qty 100

## 2022-09-21 MED ORDER — SUCROFERRIC OXYHYDROXIDE 500 MG PO CHEW
1000.0000 mg | CHEWABLE_TABLET | Freq: Three times a day (TID) | ORAL | Status: DC
  Administered 2022-09-21 – 2022-09-22 (×2): 1000 mg via ORAL
  Filled 2022-09-21 (×5): qty 2

## 2022-09-21 MED ORDER — HYDROMORPHONE HCL 1 MG/ML IJ SOLN: 0.5000 mg | INTRAMUSCULAR | Status: DC | PRN

## 2022-09-21 MED ORDER — VENLAFAXINE HCL 37.5 MG PO TABS
37.5000 mg | ORAL_TABLET | Freq: Every day | ORAL | Status: DC
  Administered 2022-09-21 – 2022-09-22 (×2): 37.5 mg via ORAL
  Filled 2022-09-21 (×2): qty 1

## 2022-09-21 MED ORDER — OXYCODONE HCL 5 MG PO TABS: 5.0000 mg | ORAL_TABLET | ORAL | Status: DC | PRN

## 2022-09-21 MED ORDER — VANCOMYCIN HCL 1000 MG IV SOLR
INTRAVENOUS | Status: AC
  Filled 2022-09-21: qty 20

## 2022-09-21 MED ORDER — ACETAMINOPHEN 10 MG/ML IV SOLN: 1000.0000 mg | Freq: Once | INTRAVENOUS | Status: DC | PRN

## 2022-09-21 MED ORDER — METOPROLOL SUCCINATE ER 50 MG PO TB24: 100.0000 mg | ORAL_TABLET | Freq: Every day | ORAL | Status: DC

## 2022-09-21 SURGICAL SUPPLY — 68 items
AUG BASEPLATE 15DEG 25 WEDGE (Joint) ×1 IMPLANT
AUGMENT BASEPLATE 15DEG 25 WDG (Joint) IMPLANT
BAG COUNTER SPONGE SURGICOUNT (BAG) ×1 IMPLANT
BIT DRILL 3.2 PERIPHERAL SCREW (BIT) IMPLANT
BLADE SAW SGTL 73X25 THK (BLADE) ×1 IMPLANT
CHLORAPREP W/TINT 26 (MISCELLANEOUS) ×2 IMPLANT
COVER SURGICAL LIGHT HANDLE (MISCELLANEOUS) ×1 IMPLANT
DRAPE C-ARM 42X72 X-RAY (DRAPES) IMPLANT
DRAPE HALF SHEET 40X57 (DRAPES) ×1 IMPLANT
DRAPE INCISE IOBAN 66X45 STRL (DRAPES) ×2 IMPLANT
DRAPE ORTHO SPLIT 77X108 STRL (DRAPES) ×2
DRAPE SURG ORHT 6 SPLT 77X108 (DRAPES) ×2 IMPLANT
DRAPE SWITCH (DRAPES) ×1 IMPLANT
DRAPE U-SHAPE 47X51 STRL (DRAPES) IMPLANT
DRSG AQUACEL AG ADV 3.5X 6 (GAUZE/BANDAGES/DRESSINGS) ×1 IMPLANT
DRSG XEROFORM 1X8 (GAUZE/BANDAGES/DRESSINGS) IMPLANT
ELECT BLADE 4.0 EZ CLEAN MEGAD (MISCELLANEOUS) ×2
ELECT REM PT RETURN 9FT ADLT (ELECTROSURGICAL) ×1
ELECTRODE BLDE 4.0 EZ CLN MEGD (MISCELLANEOUS) IMPLANT
ELECTRODE REM PT RTRN 9FT ADLT (ELECTROSURGICAL) ×1 IMPLANT
GLENOSPHERE REV SHOULDER 36 (Joint) IMPLANT
GLOVE BIO SURGEON STRL SZ 6.5 (GLOVE) ×1 IMPLANT
GLOVE BIOGEL PI IND STRL 8 (GLOVE) ×1 IMPLANT
GLOVE ECLIPSE 8.0 STRL XLNG CF (GLOVE) ×2 IMPLANT
GLOVE INDICATOR 6.5 STRL GRN (GLOVE) ×1 IMPLANT
GOWN STRL REUS W/ TWL LRG LVL3 (GOWN DISPOSABLE) ×1 IMPLANT
GOWN STRL REUS W/ TWL XL LVL3 (GOWN DISPOSABLE) IMPLANT
GOWN STRL REUS W/TWL LRG LVL3 (GOWN DISPOSABLE) ×1
GOWN STRL REUS W/TWL XL LVL3 (GOWN DISPOSABLE)
GUIDE PIN 3X75 SHOULDER (PIN) ×1
GUIDEWIRE GLENOID 2.5X220 (WIRE) IMPLANT
HANDPIECE INTERPULSE COAX TIP (DISPOSABLE) ×1
INSERT REVERSED HUMERAL SIZE 1 (Orthopedic Implant) IMPLANT
KIT BASIN OR (CUSTOM PROCEDURE TRAY) ×1 IMPLANT
KIT STABILIZATION SHOULDER (MISCELLANEOUS) ×1 IMPLANT
KIT TURNOVER KIT B (KITS) ×1 IMPLANT
MANIFOLD NEPTUNE II (INSTRUMENTS) ×1 IMPLANT
NDL HYPO 25GX1X1/2 BEV (NEEDLE) IMPLANT
NDL MAYO TROCAR (NEEDLE) IMPLANT
NEEDLE HYPO 25GX1X1/2 BEV (NEEDLE) IMPLANT
NEEDLE MAYO TROCAR (NEEDLE) IMPLANT
NS IRRIG 1000ML POUR BTL (IV SOLUTION) ×1 IMPLANT
PACK SHOULDER (CUSTOM PROCEDURE TRAY) ×1 IMPLANT
PAD ARMBOARD 7.5X6 YLW CONV (MISCELLANEOUS) ×2 IMPLANT
PENCIL BUTTON HOLSTER BLD 10FT (ELECTRODE) IMPLANT
PIN GUIDE 3X75 SHOULDER (PIN) IMPLANT
RESTRAINT HEAD UNIVERSAL NS (MISCELLANEOUS) ×1 IMPLANT
SCREW 5.5X22 (Screw) IMPLANT
SCREW 5.5X26 (Screw) IMPLANT
SCREW CORT THRD AEQ 9.5X40 (Screw) IMPLANT
SCREW PERIPHERAL 30 (Screw) IMPLANT
SET HNDPC FAN SPRY TIP SCT (DISPOSABLE) ×1 IMPLANT
SLING ARM IMMOBILIZER LRG (SOFTGOODS) ×1 IMPLANT
SPONGE T-LAP 18X18 ~~LOC~~+RFID (SPONGE) ×1 IMPLANT
STEM HUMERAL PLUS LONG SZ2 (Orthopedic Implant) IMPLANT
STRIP CLOSURE SKIN 1/2X4 (GAUZE/BANDAGES/DRESSINGS) ×1 IMPLANT
SUCTION FRAZIER HANDLE 10FR (MISCELLANEOUS)
SUCTION TUBE FRAZIER 10FR DISP (MISCELLANEOUS) IMPLANT
SUT ETHIBOND 2 V 37 (SUTURE) ×1 IMPLANT
SUT ETHIBOND NAB CT1 #1 30IN (SUTURE) ×1 IMPLANT
SUT FIBERWIRE #5 38 CONV NDL (SUTURE) ×4
SUT MNCRL AB 3-0 PS2 18 (SUTURE) ×1 IMPLANT
SUT VIC AB 2-0 CT1 27 (SUTURE) ×1
SUT VIC AB 2-0 CT1 TAPERPNT 27 (SUTURE) ×1 IMPLANT
SUTURE FIBERWR #5 38 CONV NDL (SUTURE) ×4 IMPLANT
TOWEL GREEN STERILE (TOWEL DISPOSABLE) ×1 IMPLANT
TRAY FOLEY W/BAG SLVR 14FR (SET/KITS/TRAYS/PACK) IMPLANT
WATER STERILE IRR 1000ML POUR (IV SOLUTION) ×1 IMPLANT

## 2022-09-21 NOTE — Progress Notes (Signed)
Orthopedic Tech Progress Note Patient Details:  Christina Vasquez 03-22-1956 [id number] Left shoulder ab. Pillow at desk for patient Ortho Devices Type of Ortho Device: Abduction pillow Ortho Device/Splint Interventions: Ordered      Jakerra Floyd A [name] 09/21/2022, 8:38 AM

## 2022-09-21 NOTE — Anesthesia Postprocedure Evaluation (Signed)
Anesthesia Post Note  Patient: Christina Vasquez  Procedure(s) Performed: REVERSE SHOULDER ARTHROPLASTY (Left: Shoulder)     Patient location during evaluation: PACU Anesthesia Type: General Level of consciousness: awake and sedated Pain management: pain level controlled Vital Signs Assessment: post-procedure vital signs reviewed and stable Respiratory status: spontaneous breathing Cardiovascular status: stable Postop Assessment: no apparent nausea or vomiting Anesthetic complications: no   No notable events documented.  Last Vitals:  Vitals:   09/21/22 0930 09/21/22 0945  BP: 127/82 127/84  Pulse: 71 69  Resp: 12 16  Temp:    SpO2: 100% 100%    Last Pain:  Vitals:   09/21/22 0900  TempSrc:   PainSc: 5                  [name]

## 2022-09-21 NOTE — Anesthesia Procedure Notes (Signed)
Anesthesia Regional Block: Interscalene brachial plexus block   Pre-Anesthetic Checklist: , timeout performed,  Correct Patient, Correct Site, Correct Laterality,  Correct Procedure, Correct Position, site marked,  Risks and benefits discussed,  Surgical consent,  Pre-op evaluation,  At surgeon's request and post-op pain management  Laterality: Left and Upper  Prep: chloraprep       Needles:  Injection technique: Single-shot  Needle Type: Echogenic Stimulator Needle     Needle Length: 9cm  Needle Gauge: 20   Needle insertion depth: 1 cm   Additional Needles:   Procedures:,,,, ultrasound used (permanent image in chart),,    Narrative:  Start time: 09/21/2022 7:02 AM End time: 09/21/2022 7:10 AM Injection made incrementally with aspirations every 5 mL.  Performed by: Personally  Anesthesiologist: Lyn Hollingshead, MD

## 2022-09-21 NOTE — Consult Note (Signed)
Renal Service Consult Note [facility]  Christina Vasquez 09/21/2022 Christina Blazing, MD Requesting Physician: Dr. Griffin Vasquez  Reason for Consult: ESRD sp shoulder surgery HPI: The patient is a 66 y.o. year-old w/ PMH as below presenting for L shoulder replacement surgery. She had her R shoulder done in July. We are asked to see for ESRD.    Pt seen in room, no SOB, cough or CP, no abd pain or N/V.  Pt lives alone and drives herself to HD. On HD x 4 yrs.   ROS - denies CP, no joint pain, no HA, no blurry vision, no rash, no diarrhea, no nausea/ vomiting, no dysuria, no difficulty voiding   Past Medical History  Past Medical History:  Diagnosis Date   Anxiety    Arthritis    Hyperparathyroidism ([place])    Hypertension    PONV (postoperative nausea and vomiting)    Happened one time. Has had sx since then with no N/V   Renal disorder 12/19/2018   Stage 4 ESRD. HD-MWF   Sleep apnea    Past Surgical History  Past Surgical History:  Procedure Laterality Date   A/V FISTULAGRAM  2019   ABDOMINAL HYSTERECTOMY  2005   BREAST EXCISIONAL BIOPSY Left 2017   scar not visible   CHOLECYSTECTOMY  2002   COLONOSCOPY  2010   IR AV DIALY SHUNT INTRO NEEDLE/INTRAC INITIAL W/PTA/STENT/IMG LT  2021   Stent placed in Left Arm Fistula. Placed in [place] Right 06/01/2022   Procedure: REVERSE SHOULDER ARTHROPLASTY;  Surgeon: Christina Gash, MD;  Location: [place];  Service: Orthopedics;  Laterality: Right;   TOTAL KNEE ARTHROPLASTY Right 2008   Family History  Family History  Problem Relation Age of Onset   Breast cancer Sister    Breast cancer Cousin        maternal first cousin   Social History  reports that she has never smoked. She does not have any smokeless tobacco history on file. She reports that she does not currently use alcohol. She reports that she does not use drugs. Allergies  Allergies  Allergen Reactions   Tetracyclines & Related  Hives   Home medications Prior to Admission medications   Medication Sig Start Date End Date Taking? Authorizing Provider  acetaminophen (TYLENOL) 500 MG tablet Take 1,000 mg by mouth every 6 (six) hours as needed for moderate pain.   Yes [provider]  amLODipine (NORVASC) 10 MG tablet Take 10 mg by mouth daily. 03/10/22  Yes [provider]  cinacalcet (SENSIPAR) 60 MG tablet Take 60 mg by mouth every Monday, Wednesday, and Friday. Taken after Hemodialysis   Yes [provider]  metoprolol succinate (TOPROL-XL) 100 MG 24 hr tablet Take 100 mg by mouth at bedtime. 03/10/22  Yes [provider]  MOUNJARO 2.5 MG/0.5ML Pen Inject 2.5 mg into the skin every Saturday. 05/08/22  Yes [provider]  sucroferric oxyhydroxide (VELPHORO) 500 MG chewable tablet Chew 500-1,000 mg by mouth See admin instructions. Take 1000 mg by mouth daily with meals and take 500 mg with snacks   Yes [provider]  venlafaxine (EFFEXOR) 37.5 MG tablet Take 37.5 mg by mouth daily. 12/01/21  Yes [provider]     Vitals:   09/21/22 1115 09/21/22 1130 09/21/22 1145 09/21/22 1217  BP: 124/83 137/87 126/84 128/89  Pulse: 76 69 69 73  Resp: '16 15 14 14  '$ Temp:      TempSrc:  SpO2: 96% 99% 97% 98%  Weight:      Height:       Exam Gen alert, no distress No rash, cyanosis or gangrene Sclera anicteric, throat clear  No jvd or bruits Chest clear bilat to bases, no rales/ wheezing RRR no MRG Abd soft ntnd no mass or ascites +bs GU defer MS no joint effusions or deformity, L arm in a sling Ext no LE or UE edema, no wounds or ulcers Neuro is alert, Ox 3 , nf    LUA AVF+bruit    Home meds include - amlodipine 10, cinacalcet 60 mwf, metoprolol xl 100, mounjaro, velphoro 500-'1000mg'$  ac tid, effexor, prns/ vits/ supps    OP HD: [facility] MWF  3.5h  450/1.5   101kg   2/2 bath  Heparin 5000+ 1000 prn LUA AVF - mircera 50 ug q2, last 11/8, due 11/22  -  rocaltrol 1.5 mg tiw po - sensipar 60 tiw po    Assessment/ Plan: SP L shoulder replacement surgery - done today, 11/16, per ortho ESRD - on HD MWF. Has not missed HD. 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 UF same w/ HD tomorrow.     Kelly Splinter  MD 09/21/2022, 4:17 PM Recent Labs  Lab 09/21/22 0626  HGB 10.2*  CREATININE 5.40*  K 3.6   Inpatient medications:  acetaminophen  1,000 mg Oral Q6H   [START ON 09/22/2022] amLODipine  10 mg Oral Daily   [START ON 09/22/2022] cinacalcet  60 mg Oral Q M,W,F   docusate sodium  100 mg Oral BID   fentaNYL       [START ON 09/22/2022] metoprolol succinate  100 mg Oral QHS   sucroferric oxyhydroxide  1,000 mg Oral TID WC   And   sucroferric oxyhydroxide  500 mg Oral With snacks   venlafaxine  37.5 mg Oral Daily    [START ON 09/22/2022]  ceFAZolin (ANCEF) IV     methocarbamol (ROBAXIN) IV     diphenhydrAMINE, fentaNYL, HYDROmorphone (DILAUDID) injection, menthol-cetylpyridinium **OR** phenol, methocarbamol **OR** methocarbamol (ROBAXIN) IV, ondansetron **OR** ondansetron (ZOFRAN) IV, mouth rinse, oxyCODONE, oxyCODONE

## 2022-09-21 NOTE — Anesthesia Procedure Notes (Signed)
Procedure Name: Intubation Date/Time: 09/21/2022 7:30 AM  Performed by: Terrence Dupont, CRNAPre-anesthesia Checklist: Patient identified, Emergency Drugs available, Suction available and Patient being monitored Patient Re-evaluated:Patient Re-evaluated prior to induction Oxygen Delivery Method: Circle system utilized Preoxygenation: Pre-oxygenation with 100% oxygen Induction Type: IV induction Ventilation: Mask ventilation without difficulty Laryngoscope Size: Mac and 3 Grade View: Grade I Tube type: Oral Tube size: 7.0 mm Number of attempts: 1 Airway Equipment and Method: Stylet and Oral airway Placement Confirmation: ETT inserted through vocal cords under direct vision, positive ETCO2 and breath sounds checked- equal and bilateral Secured at: 21 cm Tube secured with: Tape Dental Injury: Teeth and Oropharynx as per pre-operative assessment

## 2022-09-21 NOTE — Progress Notes (Signed)
Patient complaining of discomfort pain around fistula due to arm being bent from shoulder immobilizer. MD Griffin Basil called regarding discomfort and per MD, it is okay for patient to take immobilizer off while she is resting in bed as long as she keeps her arm straight and still. Patient educated and given a break from immobilizer.

## 2022-09-21 NOTE — Op Note (Addendum)
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  This case was much more difficult than the standard due to the patient having a fistula/graft in the same arm.  The level of care and time required to perform the case was longer than average because of this.  The instrumentation was actually relatively routine.  Blueprint protocol demonstrated that we needed a wedge for this patient.  Post-operative plan: The patient will be NWB in sling.  The patient will be will be admitted to observation due to medical complexity, monitoring and pain management.  DVT prophylaxis not indicated as patient already on full dose anticoagulant.  Pain control with PRN pain medication preferring oral medicines.  Follow up plan will be scheduled in approximately 7 days for incision check and XR.  Physical therapy to start immediately.  Implants: Tornier size 2 perform longstem implant, 0 polyethylene, 36 standard glenosphere with a 25 full wedge baseplate and a 40 by 9.5 center screw and 4 peripheral screws  Post-Op Diagnosis: Same Surgeons:Primary: Hiram Gash, MD Assistants:Caroline McBane PA-C Location: [facility] OR ROOM 07 Anesthesia: General with Exparel Interscalene Antibiotics: Ancef 2g preop, Vancomycin '1000mg'$  locally Tourniquet time: None Estimated Blood Loss: 742 Complications: None Specimens: None Implants: Implant Name Type Inv. Item Serial No. Manufacturer Lot No. LRB No. Used Action  GLENOSPHERE REV SHOULDER 36 - [id number] Joint GLENOSPHERE REV SHOULDER 36 [id number] [employer]  Left 1 Implanted  AUG BASEPLATE 15DEG 25 WEDGE - [id number] Joint AUG BASEPLATE 15DEG 25 WEDGE [id number] [employer]  Left 1 Implanted  INSERT REVERSED  HUMERAL SIZE 1 - [id number] Orthopedic Implant INSERT REVERSED HUMERAL SIZE 1 [id number] [employer]  Left 1 Implanted  STEM HUMERAL PLUS LONG SZ2 - [id number] Orthopedic Implant STEM HUMERAL PLUS LONG SZ2 [id number] [employer]  Left 1 Implanted  SCREW CORT THRD AEQ 9.5X40 - [id number] Screw SCREW CORT THRD AEQ 9.5X40  [employer]  Left 1 Implanted  SCREW PERIPHERAL 30 - [id number] Screw SCREW PERIPHERAL 30  [employer]  Left 2 Implanted  SCREW 5.5X26 - [id number] Screw SCREW 5.5X26  [employer]  Left 1 Implanted  SCREW 5.5X22 - [id number] Screw SCREW 5.5X22  [employer]  Left 1 Implanted    Indications for Surgery:   Naara Kelty is a 66 y.o. female with end-stage renal disease on dialysis with a left-sided fistula and left-sided arthritis with a cuff tear.  Benefits and risks of operative and nonoperative management were discussed prior to surgery with patient/guardian(s) and informed consent form was completed.  Infection and need for further surgery were discussed as was prosthetic stability and cuff issues.  We additionally specifically discussed risks of axillary nerve injury, infection, periprosthetic fracture, continued pain and longevity of implants prior to beginning procedure.      Procedure:   The patient was identified in the preoperative holding area where the surgical site was marked. Block placed by anesthesia with exparel.  The patient was taken to the OR where a procedural timeout was called and the above noted anesthesia was induced.  The patient was positioned beachchair on allen table with spider arm positioner.  Preoperative antibiotics were dosed.  The patient's left shoulder was prepped and draped in the usual sterile fashion.  A second preoperative timeout was called.  Standard deltopectoral approach was performed with a #10 blade. We dissected down to the subcutaneous tissues and the cephalic vein was taken laterally with the deltoid.  We had to dissect extremely  carefully and slowly to avoid damage to the cephalic as it was close to the graft.  Was maintained throughout the case.  Clavipectoral fascia was incised in line with the incision. Deep retractors were placed. The long of the biceps tendon was identified and there was significant tenosynovitis present.  Tenodesis was performed to the pectoralis tendon with #2 Ethibond. The remaining biceps was followed up into the rotator interval where it was released.   The subscapularis was taken down in a full thickness layer with capsule along the humeral neck extending inferiorly around the humeral head. We continued releasing the capsule directly off of the osteophytes inferiorly all the way around the corner. This allowed [place] to dislocate the humeral head.   The humeral head had evidence of severe osteoarthritic wear with full-thickness cartilage loss and exposed subchondral bone. There was significant flattening of the humeral head.   The rotator cuff was carefully examined and noted to be irreperably torn.  The decision was confirmed that a reverse total shoulder was indicated for this patient.  There were osteophytes along the inferior humeral neck. The osteophytes were removed with an osteotome and a rongeur.  Osteophytes were removed with a rongeur and an osteotome and the anatomic neck was well visualized.     A humeral cutting guide was used extra medullary with a pin to help control version. The version was set at 20 of retroversion. Humeral osteotomy was performed with an oscillating saw. The head fragment was passed off the back table.  A cut protector plate was placed.  The subscapularis was again identified and immediately we took care to palpate the axillary nerve anteriorly and verify its position with gentle palpation as well as the tug test.  We then released the SGHL with bovie cautery prior to placing a curved mayo at the junction of the anterior glenoid well above the axillary nerve and bluntly  dissecting the subscapularis from the capsule.  We then carefully protected the axillary nerve as we gently released the inferior capsule to fully mobilize the subscapularis.  An anterior deltoid retractor was then placed as well as a small Hohmann retractor superiorly.   The glenoid was inspected and had evidence of severe osteoarthritic wear with full-thickness cartilage loss and exposed subchondral bone.   The remaining labrum was removed circumferentially taking great care not to disrupt the posterior capsule.   At this point we felt based on blueprint templating that a full wedge augment was necessary.  We began by using a full wedge guide to place our center pin as was templated.  We had good position of this pin and we proceeded with our starter center drill.  This allowed for [place] to use the 15 degree full wedge reamer obtaining circumferential witness marks and good bone preparation for ingrowth.  At this point we proceeded with our center drill and had an intact vault.  We then drilled our center screw to a length of 40 mm.    We selected a 9.0 mm x 40 mm screw and the full wedge baseplate which was placed in the same orientation as our reaming.  We double checked that we had good apposition of the base plate to bone and then proceeded to place 3 locking screws and one nonlocking screw as is typical. Next  a 36 mm glenosphere was selected and impacted onto the baseplate. The center screw was tightened.  We turned attention back to the humeral side. The cut protector was removed.  We used the perform humeral sizing block to select the appropriate size which for this patient was a 2.  We then placed our center pin and reamed over it concentrically obtaining appropriate inset.  We then used our lateralizing chisel to prepare the lateral aspect of the humerus.  At that point we selected the appropriate implant trialing a 2 long.  Using this trial implant we trialed multiple polyethylene sizes settling  on a 0 which provided good stability and range of motion without excess soft tissue tension. The offset was dialed in to match the normal anatomy. The shoulder was trialed.  There was good ROM in all planes and the shoulder was stable with no inferior translation.  The real humeral implants were opened after again confirming sizes.  The trial was removed. #5 Fiberwire x4 sutures passed through the humeral neck for subscap repair. The humeral component was press-fit obtaining a secure fit. The joint was reduced and thoroughly irrigated with pulsatile lavage. Subscap was repaired back with #5 Fiberwire sutures through bone tunnels. Hemostasis was obtained. The deltopectoral interval was reapproximated with #1 Ethibond. The subcutaneous tissues were closed with 2-0 Vicryl and the skin was closed with running nylon.    The wounds were cleaned and dried and an Aquacel dressing was placed. The drapes taken down. The arm was placed into sling with abduction pillow. Patient was awakened, extubated, and transferred to the recovery room in stable condition. There were no intraoperative complications. The sponge, needle, and attention counts were  correct at the end of the case.     [name], PA-C, present and scrubbed throughout the case, critical for completion in a timely fashion, and for retraction, instrumentation, closure.

## 2022-09-21 NOTE — Interval H&P Note (Signed)
All questions answered, patient wants to proceed with procedure. ? ?

## 2022-09-21 NOTE — Transfer of Care (Signed)
Immediate Anesthesia Transfer of Care Note  Patient: Christina Vasquez  Procedure(s) Performed: REVERSE SHOULDER ARTHROPLASTY (Left: Shoulder)  Patient Location: PACU  Anesthesia Type:GA combined with regional for post-op pain  Level of Consciousness: awake and alert   Airway & Oxygen Therapy: Patient Spontanous Breathing and Patient connected to nasal cannula oxygen  Post-op Assessment: Report given to RN and Post -op Vital signs reviewed and stable  Post vital signs: Reviewed and stable  Last Vitals:  Vitals Value Taken Time  BP 132/85 09/21/22 0900  Temp 36.2 C 09/21/22 0900  Pulse 72 09/21/22 0903  Resp 17 09/21/22 0903  SpO2 92 % 09/21/22 0903  Vitals shown include unvalidated device data.  Last Pain:  Vitals:   09/21/22 0622  TempSrc:   PainSc: 0-No pain      Patients Stated Pain Goal: 0 (97/84/78 4128)  Complications: No notable events documented.

## 2022-09-22 ENCOUNTER — Encounter (HOSPITAL_COMMUNITY): Payer: Self-pay | Admitting: Orthopaedic Surgery

## 2022-09-22 DIAGNOSIS — M19012 Primary osteoarthritis, left shoulder: Secondary | ICD-10-CM | POA: Diagnosis not present

## 2022-09-22 LAB — CBC
HCT: 28.3 % — ABNORMAL LOW (ref 36.0–46.0)
Hemoglobin: 9.5 g/dL — ABNORMAL LOW (ref 12.0–15.0)
MCH: 27.1 pg (ref 26.0–34.0)
MCHC: 33.6 g/dL (ref 30.0–36.0)
MCV: 80.9 fL (ref 80.0–100.0)
Platelets: 194 10*3/uL (ref 150–400)
RBC: 3.5 MIL/uL — ABNORMAL LOW (ref 3.87–5.11)
RDW: 15.9 % — ABNORMAL HIGH (ref 11.5–15.5)
WBC: 12 10*3/uL — ABNORMAL HIGH (ref 4.0–10.5)
nRBC: 0 % (ref 0.0–0.2)

## 2022-09-22 LAB — BASIC METABOLIC PANEL
Anion gap: 13 (ref 5–15)
BUN: 45 mg/dL — ABNORMAL HIGH (ref 8–23)
CO2: 29 mmol/L (ref 22–32)
Calcium: 9.3 mg/dL (ref 8.9–10.3)
Chloride: 92 mmol/L — ABNORMAL LOW (ref 98–111)
Creatinine, Ser: 6.43 mg/dL — ABNORMAL HIGH (ref 0.44–1.00)
GFR, Estimated: 7 mL/min — ABNORMAL LOW (ref >60)
Glucose, Bld: 110 mg/dL — ABNORMAL HIGH (ref 70–99)
Potassium: 4.1 mmol/L (ref 3.5–5.1)
Sodium: 134 mmol/L — ABNORMAL LOW (ref 135–145)

## 2022-09-22 LAB — HEPATITIS B SURFACE ANTIBODY, QUANTITATIVE: Hep B S AB Quant (Post): 33.5 m[IU]/mL (ref >9.9)

## 2022-09-22 MED ORDER — ONDANSETRON HCL 4 MG PO TABS: 4.0000 mg | ORAL_TABLET | Freq: Three times a day (TID) | ORAL | 0 refills | Status: AC | PRN

## 2022-09-22 MED ORDER — OXYCODONE HCL 5 MG PO TABS: ORAL_TABLET | ORAL | 0 refills | Status: AC

## 2022-09-22 MED ORDER — ALBUMIN HUMAN 25 % IV SOLN
25.0000 g | Freq: Once | INTRAVENOUS | Status: AC
  Administered 2022-09-22: 25 g via INTRAVENOUS

## 2022-09-22 MED ORDER — HEPARIN SODIUM (PORCINE) 1000 UNIT/ML IJ SOLN
INTRAMUSCULAR | Status: AC
  Administered 2022-09-22: 1800 [IU]
  Filled 2022-09-22: qty 2

## 2022-09-22 MED ORDER — ACETAMINOPHEN 500 MG PO TABS: 1000.0000 mg | ORAL_TABLET | Freq: Three times a day (TID) | ORAL | 0 refills | Status: AC

## 2022-09-22 MED ORDER — ALBUMIN HUMAN 25 % IV SOLN: 12.5000 g | Freq: Once | INTRAVENOUS | Status: DC

## 2022-09-22 MED ORDER — ALBUMIN HUMAN 25 % IV SOLN
INTRAVENOUS | Status: AC
  Filled 2022-09-22: qty 100

## 2022-09-22 MED ORDER — ASPIRIN 81 MG PO CHEW: 81.0000 mg | CHEWABLE_TABLET | Freq: Two times a day (BID) | ORAL | 0 refills | Status: AC

## 2022-09-22 NOTE — Evaluation (Signed)
Occupational Therapy Evaluation Patient Details Name: Christina Vasquez MRN: [id number] DOB: September 24, 1956 Today's Date: 09/22/2022   History of Present Illness Christina Vasquez is a 66 y.o. female who presents for preoperative history and physical prior to scheduled surgery, Procedure(s):  REVERSE SHOULDER ARTHROPLASTY.  Recent history of left shoulder replacement approximately 3 months ago.   Clinical Impression   Pt completed bathing and dressing at overall min assist level.  Discussed wearing pullover gowns for ease of use with dressing and toileting initially as well as getting a LH sponge and shower seat for bathing tasks.  She will look into post discharge.  She has been educated and has been able to return demonstrate donning and doffing her sling as UB dressing, toilet transfers, and simulated tub transfers at supervision level.  Recommend follow-up OT per MD recommendation to rehab shoulder once PROM/AROM is allowed.  No further acute OT needs at this time.        Recommendations for follow up therapy are one component of a multi-disciplinary discharge planning process, led by the attending physician.  Recommendations may be updated based on patient status, additional functional criteria and insurance authorization.   Follow Up Recommendations  Follow physician's recommendations for discharge plan and follow up therapies     Assistance Recommended at Discharge Intermittent Supervision/Assistance  Patient can return home with the following A little help with bathing/dressing/bathroom;Assistance with cooking/housework;Assist for transportation    Functional Status Assessment  Patient has had a recent decline in their functional status and demonstrates the ability to make significant improvements in function in a reasonable and predictable amount of time.  Equipment Recommendations  None recommended by OT       Precautions / Restrictions Precautions Precautions: Shoulder Type of  Shoulder Precautions: NO PROM/AROM of shoulder.  Elbow, wrist, and digit AROM OK Shoulder Interventions: Don joy ultra sling;At all times;Off for dressing/bathing/exercises Precaution Booklet Issued: No Required Braces or Orthoses: Sling Restrictions Weight Bearing Restrictions: Yes LUE Weight Bearing: Non weight bearing      Mobility Bed Mobility Overal bed mobility: Modified Independent Bed Mobility: Supine to Sit     Supine to sit: Modified independent (Device/Increase time), HOB elevated     General bed mobility comments: Pt reports sleeping on the sofa at home.    Transfers Overall transfer level: Independent Equipment used: None Transfers: Sit to/from Stand, Bed to chair/wheelchair/BSC Sit to Stand: Modified independent (Device/Increase time)     Step pivot transfers: Modified independent (Device/Increase time)            Balance Overall balance assessment: Mild deficits observed, not formally tested                                         ADL either performed or assessed with clinical judgement   ADL Overall ADL's : Needs assistance/impaired Eating/Feeding: Independent;Sitting   Grooming: Wash/dry hands;Wash/dry face;Modified independent   Upper Body Bathing: Minimal assistance;Standing   Lower Body Bathing: Minimal assistance;Sit to/from stand   Upper Body Dressing : Supervision/safety;Sitting Upper Body Dressing Details (indicate cue type and reason): including sling Lower Body Dressing: Sit to/from stand;Minimal assistance   Toilet Transfer: Modified Independent;Ambulation;Regular Toilet   Toileting- Clothing Manipulation and Hygiene: Minimal assistance;Sit to/from stand   Tub/ Shower Transfer: Supervision/safety;Ambulation;Tub transfer   Functional mobility during ADLs: Modified independent General ADL Comments: Pt educated on shoulder precautions for bathing and dressing, donning and doffing  sling, and AROM for non-involved  joints.  Recommended shower seat for safety but pt feels her tub may be too small.  She plans to sponge bathe initially until her dressing is removed and steri strips are applied.  Not sure what sutures/staples are in place currently.  Also, provided education on need for a LH sponge to assist with washing the right arm secondary to not being allowed active shoulder movement.  She voiced undeerstanding and was able to return demonstrate AROM and donning of sling.  Encouraged need for having someone present initially with showering.  Exercise handout for non-involved joints as well as shoulder protocol sheet issued.     Vision Baseline Vision/History: 1 Wears glasses Ability to See in Adequate Light: 0 Adequate Patient Visual Report: No change from baseline Vision Assessment?: No apparent visual deficits            Pertinent Vitals/Pain Pain Assessment Pain Assessment: 0-10 Pain Score: 8  Pain Location: left shoulder Pain Descriptors / Indicators: Discomfort Pain Intervention(s): Limited activity within patient's tolerance     Hand Dominance Right   Extremity/Trunk Assessment Upper Extremity Assessment Upper Extremity Assessment: LUE deficits/detail LUE Deficits / Details: LUE positioned in [place] sling.  AROM elbow, wrist, and digits WFLs.  NO ROM at the shoulder allowed secondary to precautions. LUE Sensation: WNL LUE Coordination: decreased gross motor   Lower Extremity Assessment Lower Extremity Assessment: Overall WFL for tasks assessed   Cervical / Trunk Assessment Cervical / Trunk Assessment: Normal   Communication Communication Communication: No difficulties   Cognition Arousal/Alertness: Awake/alert Behavior During Therapy: WFL for tasks assessed/performed Overall Cognitive Status: Within Functional Limits for tasks assessed                                       General Comments  Pt modified independent for mobility with wider BOS but no LOB.        Shoulder Instructions Shoulder Instructions Donning/doffing shirt without moving shoulder: Supervision/safety;Patient able to independently direct caregiver Method for sponge bathing under operated UE: Supervision/safety;Patient able to independently direct caregiver Donning/doffing sling/immobilizer: Modified independent;Patient able to independently direct caregiver Correct positioning of sling/immobilizer: Independent;Patient able to independently direct caregiver ROM for elbow, wrist and digits of operated UE: Independent;Patient able to independently direct caregiver Sling wearing schedule (on at all times/off for ADL's): Independent;Patient able to independently direct caregiver Proper positioning of operated UE when showering: Independent;Patient able to independently direct caregiver Positioning of UE while sleeping: Independent;Patient able to independently direct caregiver    Home Living Family/patient expects to be discharged to:: Private residence Living Arrangements: Alone Available Help at Discharge: Family (has family and neighbors that can help but not 24 hr.) Type of Home: House Home Access: Stairs to enter [employer] of Steps: 3 Entrance Stairs-Rails: Left Home Layout: One level     Bathroom Shower/Tub: Teacher, early years/pre: Standard Bathroom Accessibility: Yes How Accessible: Accessible via walker Home Equipment: None          Prior Functioning/Environment Prior Level of Function : Independent/Modified Independent                        OT Problem List: Decreased strength;Decreased range of motion;Impaired UE functional use;Pain                   AM-PAC OT "6 Clicks" Daily Activity  Outcome Measure Help from another person eating meals?: None Help from another person taking care of personal grooming?: None Help from another person toileting, which includes using toliet, bedpan, or urinal?: A Little Help from  another person bathing (including washing, rinsing, drying)?: A Little Help from another person to put on and taking off regular upper body clothing?: A Little Help from another person to put on and taking off regular lower body clothing?: A Little 6 Click Score: 20   End of Session Equipment Utilized During Treatment: Other (comment) (sling) Nurse Communication: Mobility status  Activity Tolerance: Patient tolerated treatment well Patient left: in bed;Other (comment) (sitting EOB)  OT Visit Diagnosis: Muscle weakness (generalized) (M62.81);Pain Pain - Right/Left: Left Pain - part of body: Shoulder                Time: 1443-1540 OT Time Calculation (min): 63 min Charges:  OT General Charges $OT Visit: 1 Visit OT Evaluation $OT Eval Moderate Complexity: 1 Mod OT Treatments $Self Care/Home Management : 38-52 mins  Maritta Kief OTR/L 09/22/2022, 3:27 PM

## 2022-09-22 NOTE — Progress Notes (Signed)
PHARMACY NOTE:  ANTIMICROBIAL RENAL DOSAGE ADJUSTMENT  Current antimicrobial regimen includes a mismatch between antimicrobial dosage and estimated renal function.  As per policy approved by the Pharmacy & Therapeutics and Medical Executive Committees, the antimicrobial dosage will be adjusted accordingly.  Current antimicrobial dosage:  Cefazolin 1g IV q6hr x 3 doses for surgical prophylaxis ordered post op.  Indication: Surgical prophylaxis  Renal Function:  Estimated Creatinine Clearance: 9.8 mL/min (A) (by C-G formula based on SCr of 6.43 mg/dL (H)). '[x]'$      On intermittent HD, scheduled: MWF '[]'$      On CRRT    Antimicrobial dosage has been changed to:  Cefazolin 1 g IV q24h  x3 doses.  On HD days will give dose post HD  Additional comments:  Pharmacy will sign off.   Thank you for allowing pharmacy to be a part of this patient's care. Nicole Cella, RPh Clinical Pharmacist 09/22/2022 7:53 AM Please check AMION for all [place] phone numbers After 10:00 PM, call [place] [phone]

## 2022-09-22 NOTE — Plan of Care (Signed)

## 2022-09-22 NOTE — Progress Notes (Addendum)
Received patient in bed to unit. Alert and oriented. Informed consent signed and in chart.09/22/22   Treatment initiated: 7628 Treatment completed: 1114   Patient tolerated well. Transported back to the room Alert, without acute distress. Hand-off given to patient's nurse.   Access used: AVF Access issues: none   Dressing: gauze, medipore tape Total UF removed: 575m Medication(s) given: calcitrol P.O 1.5MCG, Albumin 25% 25G 1062mPost HD VS: see chart below  Post HD weight: 101.5kg   09/22/22 1135  Vitals  Temp 97.9 F (36.6 C)  BP 122/74  Pulse Rate 71  Resp 20  Oxygen Therapy  SpO2 100 %  O2 Device Nasal Cannula  O2 Flow Rate (L/min) 2 L/min  Post Treatment  Dialyzer Clearance Clear  Duration of HD Treatment -hour(s) 3.5 hour(s)  Hemodialysis Intake (mL) 150 mL  Liters Processed 80.5  Fluid Removed (mL) 500 mL  Tolerated HD Treatment Yes  AVG/AVF Arterial Site Held (minutes) 5 minutes  AVG/AVF Venous Site Held (minutes) 7 minutes

## 2022-09-22 NOTE — Progress Notes (Signed)
[facility] Progress Note   Subjective:   Patient seen and examined at bedside in dialysis.  BP drop with associated nausea/dizziness.  Just given fluid bolus with some improvement. Reports minimal pain from surgery.   Objective Vitals:   09/22/22 1039 09/22/22 1041 09/22/22 1052 09/22/22 1053  BP: (!) 76/57 (!) 74/49 (!) 69/50 (!) 74/55  Pulse:      Resp:      Temp:      TempSrc:      SpO2:      Weight:      Height:       Physical Exam General:WDWN female in NAD Heart:RRR, no mrg Lungs:nml WOB on RA Abdomen:soft, NTND Extremities:no LE edema Dialysis Access: LU AVF +b/t   Filed Weights   09/21/22 0610 09/22/22 0723  Weight: 100.7 kg 102 kg   No intake or output data in the 24 hours ending 09/22/22 1057  Additional Objective Labs: Basic Metabolic Panel: Recent Labs  Lab 09/21/22 0626 09/22/22 0428  NA 136 134*  K 3.6 4.1  CL 93* 92*  CO2  --  29  GLUCOSE 97 110*  BUN 28* 45*  CREATININE 5.40* 6.43*  CALCIUM  --  9.3    CBC: Recent Labs  Lab 09/21/22 0626 09/22/22 0428  WBC  --  12.0*  HGB 10.2* 9.5*  HCT 30.0* 28.3*  MCV  --  80.9  PLT  --  194   Studies/Results: DG Shoulder Left Port  Result Date: 09/21/2022 CLINICAL DATA:  Postop. EXAM: LEFT SHOULDER COMPARISON:  CT Shoulder 07/06/22 FINDINGS: Interval postsurgical changes from left shoulder arthroplasty. There is likely a small amount of air in the left glenohumeral joint. No evidence of fracture. No dislocation. Redemonstrated venous stent in the left brachiocephalic vein. The visualized left hemithorax is notable for likely low lung volumes. IMPRESSION: Expected postsurgical changes from a left shoulder arthroplasty. Electronically Signed   By: Marin Roberts M.D.   On: 09/21/2022 09:43    Medications:  albumin human      ceFAZolin (ANCEF) IV     methocarbamol (ROBAXIN) IV      amLODipine  10 mg Oral Daily   calcitRIOL  1.5 mcg Oral Q M,W,F-HD   Chlorhexidine Gluconate Cloth   6 each Topical Q0600   cinacalcet  60 mg Oral Q M,W,F   cinacalcet  60 mg Oral Q M,W,F-HD   docusate sodium  100 mg Oral BID   metoprolol succinate  100 mg Oral QHS   sucroferric oxyhydroxide  1,000 mg Oral TID WC   And   sucroferric oxyhydroxide  500 mg Oral With snacks   venlafaxine  37.5 mg Oral Daily    Dialysis Orders:  [facility] MWF  3.5h  450/1.5   101kg   2/2 bath  Heparin 5000+ 1000 prn LUA AVF - mircera 50 ug q2, last 11/8, due 11/22  - rocaltrol 1.5 mg tiw po - sensipar 60 tiw po      Assessment/ Plan: SP L shoulder replacement surgery - completed 11/16, per ortho ESRD - on HD MWF. 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 Cont vdra, sensipar tiw and binder ac tid while here HTN - symptomatic hypotension during HD today, given small fluid bolus and albumin for BP support.  Continue home meds with holding parameter.  Volume - Does not appear volume overloaded.  Plan for UF to dry weight.  Dispo - plan to d/c home today.  Jen Mow, PA-C [facility] 09/22/2022,10:57 AM  LOS: 0 days

## 2022-09-22 NOTE — Progress Notes (Signed)
   ORTHOPAEDIC PROGRESS NOTE  s/p Procedure(s): REVERSE SHOULDER ARTHROPLASTY on 09/21/22 with Dr. Griffin Basil  SUBJECTIVE: Reports mild pain about operative site. Nerve block is wearing off. She is getting transported to morning dialysis. No chest pain. No SOB. No nausea/vomiting. No other complaints.  OBJECTIVE: PE: General: sitting up in hospital bed, NAD LUE: Dressing CDI and sling well fitting,  full and painless ROM throughout hand with DPC of 0. + Motor in  AIN, PIN, Ulnar distributions. Axillary nerve sensation preserved and symmetric.  Sensation intact in medial, radial, and ulnar distributions. Well perfused digits.    Vitals:   09/22/22 0402 09/22/22 0723  BP: 130/86 133/77  Pulse: 76 78  Resp: 16 20  Temp:  98.7 F (37.1 C)  SpO2: 93% 100%   Stable post-op images.   ASSESSMENT: Christina Vasquez is a 66 y.o. female doing well postoperatively. POD#1  PLAN: Weightbearing: NWB LUE Insicional and dressing care: Reinforce dressings as needed Orthopedic device(s):  Sling Showering: Post-op day #2 with assistance VTE prophylaxis: Aspirin. SCDs. Pain control: PRN pain medications, minimize narcotics as able Follow - up plan: 2 weeks in office Dispo: Patient to discharge home after dialysis  Contact information:  Weekdays 8-5 Dr. Ophelia Charter, [name] PA-C, After hours and holidays please check [email or web address] for group call information for [facility]   [name], PA-C 09/22/2022

## 2022-09-22 NOTE — Discharge Instructions (Signed)
Ophelia Charter MD, MPH [name], PA-C [place] [address], [address] [phone] (tel)   [phone] (fax)   [place] may leave the operative dressing in place until your follow-up appointment. KEEP THE INCISIONS CLEAN AND DRY. There may be a small amount of fluid/bleeding leaking at the surgical site. This is normal after surgery.  If it fills with liquid or blood please call us immediately to change it for you. Use the provided ice machine or Ice packs as often as possible for the first 3-4 days, then as needed for pain relief.   Keep a layer of cloth or a shirt between your skin and the cooling unit to prevent frost bite as it can get very cold.  SHOWERING: - You may shower on Post-Op Day #2.  - The dressing is water resistant but do not scrub it as it may start to peel up.   - You may remove the sling for showering - Gently pat the area dry.  - Do not soak the shoulder in water.  - Do not go swimming in the pool or ocean until your incision has completely healed (about 4-6 weeks after surgery) - KEEP THE INCISIONS CLEAN AND DRY.  EXERCISES Wear the sling at all times  You may remove the sling for showering, but keep the arm across the chest or in a secondary sling.    Accidental/Purposeful External Rotation and shoulder flexion (reaching behind you) is to be avoided at all costs for the first month. It is ok to come out of your sling if your are sitting and have assistance for eating.   Do not lift anything heavier than 1 pound until we discuss it further in clinic.  It is normal for your fingers/hand to become more swollen after surgery and discolored from bruising.   This will resolve over the first few weeks usually after surgery. Please continue to ambulate and do not stay sitting or lying for too long.  Perform foot and wrist pumps to assist in circulation.  PHYSICAL  THERAPY - You will begin physical therapy soon after surgery (unless otherwise specified) - Please call to set up an appointment, if you do not already have one  - Let our office if there are any issues with scheduling your therapy  - You have a physical therapy appointment scheduled at [facility] (across the hall from our office) on Monday, 11/20 @ [place] ([place]) The anesthesia team may have performed a nerve block for you this is a great tool used to minimize pain.   The block may start wearing off overnight (between 8-24 hours postop) When the block wears off, your pain may go from nearly zero to the pain you would have had postop without the block. This is an abrupt transition but nothing dangerous is happening.   This can be a challenging period but utilize your as needed pain medications to try and manage this period. We suggest you use the pain medication the first night prior to going to bed, to ease this transition.  You may take an extra dose of narcotic when this happens if needed   POST-OP MEDICATIONS- Multimodal approach to pain control In general your pain will be controlled with a combination of substances.  Prescriptions unless otherwise discussed are electronically sent to your pharmacy.  This is a carefully made plan we use to minimize narcotic use.  Acetaminophen - Non-narcotic pain medicine taken on a scheduled basis  Oxycodone - This is a strong narcotic, to be used only on an "as needed" basis for SEVERE pain. Aspirin '81mg'$  - This medicine is used to minimize the risk of blood clots after surgery. Zofran -  take as needed for nausea   FOLLOW-UP If you develop a Fever (>101.5), Redness or Drainage from the surgical incision site, please call our office to arrange for an evaluation. Please call the office to schedule a follow-up appointment for a wound check, 7-10 days post-operatively.  IF YOU HAVE ANY QUESTIONS, PLEASE FEEL FREE TO CALL OUR  OFFICE.  HELPFUL INFORMATION  Your arm will be in a sling following surgery. You will be in this sling for the next 4 weeks.   You may be more comfortable sleeping in a semi-seated position the first few nights following surgery.  Keep a pillow propped under the elbow and forearm for comfort.  If you have a recliner type of chair it might be beneficial.  If not that is fine too, but it would be helpful to sleep propped up with pillows behind your operated shoulder as well under your elbow and forearm.  This will reduce pulling on the suture lines.  When dressing, put your operative arm in the sleeve first.  When getting undressed, take your operative arm out last.  Loose fitting, button-down shirts are recommended.  In most states it is against the law to drive while your arm is in a sling. And certainly against the law to drive while taking narcotics.  You may return to work/school in the next couple of days when you feel up to it. Desk work and typing in the sling is fine.  We suggest you use the pain medication the first night prior to going to bed, in order to ease any pain when the anesthesia wears off. You should avoid taking pain medications on an empty stomach as it will make you nauseous.  You should wean off your narcotic medicines as soon as you are able.     Most patients will be off or using minimal narcotics before their first postop appointment.   Do not drink alcoholic beverages or take illicit drugs when taking pain medications.  Pain medication may make you constipated.  Below are a few solutions to try in this order: Decrease the amount of pain medication if you aren't having pain. Drink lots of decaffeinated fluids. Drink prune juice and/or each dried prunes  If the first 3 don't work start with additional solutions Take Colace - an over-the-counter stool softener Take Senokot - an over-the-counter laxative Take Miralax - a stronger over-the-counter  laxative   Dental Antibiotics:  In most cases prophylactic antibiotics for Dental procdeures after total joint surgery are not necessary.  Exceptions are as follows:  1. History of prior total joint infection  2. Severely immunocompromised (Organ Transplant, cancer chemotherapy, Rheumatoid biologic meds such as Saline)  3. Poorly controlled diabetes (A1C &gt; 8.0, blood glucose over 200)  If you have one of these conditions, contact your surgeon for an antibiotic prescription, prior to your dental procedure.   For more information including helpful videos and documents visit our website:   [email or web address]

## 2022-10-02 NOTE — Discharge Summary (Signed)
Patient ID: Christina Vasquez MRN: [id number] DOB/AGE: Oct 16, 1956 66 y.o.  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 Has had sx since then with no N/V   Renal disorder 12/19/2018   Stage 4 ESRD. HD-MWF   Sleep apnea      Procedures Performed: Left lateralized augmented reverse total Shoulder Arthroplasty   Discharged Condition: stable  Hospital Course:Patient brought in as an outpatient for surgery.  She tolerated procedure well.  She was kept for monitoring overnight for pain control, medical monitoring postop, OT, and dialysis. She was found to be stable for DC home the morning after surgery. She had dialysis scheduled at the hospital prior to discharge. She did well with OT. Patient was instructed on specific activity restrictions and all questions were answered.    Consults: Nephrology, OT  Significant Diagnostic Studies: No additional pertinent studies  Treatments: Surgery, Dialysis  Discharge Exam: General: sitting up in hospital bed, NAD LUE: Dressing CDI and sling well fitting,  full and painless ROM throughout hand with [facility] of 0. + Motor in  AIN, PIN, Ulnar distributions. Axillary nerve sensation preserved and symmetric.  Sensation intact in medial, radial, and ulnar distributions. Well perfused digits.   Disposition: Discharge disposition: 01-Home or Self Care       Discharge Instructions     Call MD for:  redness, tenderness, or signs of infection (pain, swelling, redness, odor or green/yellow discharge around incision site)   Complete by: As directed    Call MD for:  severe uncontrolled pain   Complete  by: As directed    Call MD for:  temperature >100.4   Complete by: As directed    Diet - low sodium heart healthy   Complete by: As directed       Allergies as of 09/22/2022       Reactions   Tetracyclines & Related Hives        Medication List     TAKE these medications    acetaminophen 500 MG tablet Commonly known as: TYLENOL Take 2 tablets (1,000 mg total) by mouth every 8 (eight) hours for 14 days. What changed:  when to take this reasons to take this   amLODipine 10 MG tablet Commonly known as: NORVASC Take 10 mg by mouth daily.   aspirin 81 MG chewable tablet Commonly known as: Aspirin Childrens Chew 1 tablet (81 mg total) by mouth 2 (two) times daily. For 6 weeks for DVT prophylaxis after surgery   cinacalcet 60 MG tablet Commonly known as: SENSIPAR Take 60 mg by mouth every Monday, Wednesday, and Friday. Taken after Hemodialysis   metoprolol succinate 100 MG 24 hr tablet Commonly known as: TOPROL-XL Take 100 mg by mouth at bedtime.   Mounjaro 2.5 MG/0.5ML Pen Generic drug: tirzepatide Inject 2.5 mg into the skin every Saturday.   Velphoro 500 MG chewable tablet Generic drug: sucroferric oxyhydroxide Chew 500-1,000 mg by mouth See admin instructions. Take 1000 mg by mouth daily with meals and take 500 mg with snacks   venlafaxine 37.5 MG tablet Commonly known as: EFFEXOR Take 37.5 mg by mouth daily.  ASK your doctor about these medications    ondansetron 4 MG tablet Commonly known as: Zofran Take 1 tablet (4 mg total) by mouth every 8 (eight) hours as needed for up to 7 days for nausea or vomiting. Ask about: Should I take this medication?   oxyCODONE 5 MG immediate release tablet Commonly known as: Oxy IR/ROXICODONE Take 1-2 pills every 6 hrs as needed for severe pain, no more than 6 per day Ask about: Should I take this medication?        Follow-up Information     Hiram Gash, MD Follow up on 10/03/2022.   Specialty:  Orthopedic Surgery Why: @ 8 am with [name], PA-C - post-op visit and for suture removal Contact information: [address] [place] [address] [phone]

## 2022-10-17 ENCOUNTER — Ambulatory Visit: Payer: Medicare Other

## 2022-10-17 ENCOUNTER — Ambulatory Visit
Admission: RE | Admit: 2022-10-17 | Discharge: 2022-10-17 | Disposition: A | Discharge status: Home / Self Care | Payer: Medicare Other | Source: Ambulatory Visit | Attending: Family Medicine | Admitting: Family Medicine

## 2022-10-17 DIAGNOSIS — R928 Other abnormal and inconclusive findings on diagnostic imaging of breast: Secondary | ICD-10-CM

## 2022-10-25 ENCOUNTER — Ambulatory Visit: Payer: Medicare Other

## 2022-11-20 ENCOUNTER — Other Ambulatory Visit: Payer: Self-pay | Admitting: Gastroenterology

## 2022-12-26 ENCOUNTER — Encounter (HOSPITAL_COMMUNITY): Payer: Self-pay | Admitting: Gastroenterology

## 2023-01-02 ENCOUNTER — Other Ambulatory Visit: Payer: Self-pay

## 2023-01-02 ENCOUNTER — Encounter (HOSPITAL_COMMUNITY): Payer: Self-pay | Admitting: Gastroenterology

## 2023-01-02 ENCOUNTER — Ambulatory Visit (HOSPITAL_COMMUNITY)
Admission: RE | Admit: 2023-01-02 | Discharge: 2023-01-02 | Disposition: A | Discharge status: Home / Self Care | Payer: Medicare Other | Attending: Gastroenterology | Admitting: Gastroenterology

## 2023-01-02 ENCOUNTER — Ambulatory Visit (HOSPITAL_BASED_OUTPATIENT_CLINIC_OR_DEPARTMENT_OTHER): Payer: Medicare Other | Admitting: Certified Registered"

## 2023-01-02 ENCOUNTER — Encounter (HOSPITAL_COMMUNITY)
Admission: RE | Disposition: A | Discharge status: Home / Self Care | Payer: Self-pay | Source: Home / Self Care | Attending: Gastroenterology

## 2023-01-02 ENCOUNTER — Ambulatory Visit (HOSPITAL_COMMUNITY): Payer: Medicare Other | Admitting: Certified Registered"

## 2023-01-02 DIAGNOSIS — K635 Polyp of colon: Secondary | ICD-10-CM | POA: Insufficient documentation

## 2023-01-02 DIAGNOSIS — Z992 Dependence on renal dialysis: Secondary | ICD-10-CM

## 2023-01-02 DIAGNOSIS — Z1211 Encounter for screening for malignant neoplasm of colon: Secondary | ICD-10-CM | POA: Insufficient documentation

## 2023-01-02 DIAGNOSIS — I120 Hypertensive chronic kidney disease with stage 5 chronic kidney disease or end stage renal disease: Secondary | ICD-10-CM

## 2023-01-02 DIAGNOSIS — N186 End stage renal disease: Secondary | ICD-10-CM | POA: Insufficient documentation

## 2023-01-02 DIAGNOSIS — M1990 Unspecified osteoarthritis, unspecified site: Secondary | ICD-10-CM | POA: Diagnosis not present

## 2023-01-02 DIAGNOSIS — R195 Other fecal abnormalities: Secondary | ICD-10-CM | POA: Insufficient documentation

## 2023-01-02 DIAGNOSIS — K5730 Diverticulosis of large intestine without perforation or abscess without bleeding: Secondary | ICD-10-CM

## 2023-01-02 DIAGNOSIS — E669 Obesity, unspecified: Secondary | ICD-10-CM | POA: Insufficient documentation

## 2023-01-02 DIAGNOSIS — Z6839 Body mass index (BMI) 39.0-39.9, adult: Secondary | ICD-10-CM | POA: Diagnosis not present

## 2023-01-02 DIAGNOSIS — G4730 Sleep apnea, unspecified: Secondary | ICD-10-CM | POA: Insufficient documentation

## 2023-01-02 DIAGNOSIS — Z79899 Other long term (current) drug therapy: Secondary | ICD-10-CM | POA: Diagnosis not present

## 2023-01-02 DIAGNOSIS — D122 Benign neoplasm of ascending colon: Secondary | ICD-10-CM | POA: Insufficient documentation

## 2023-01-02 HISTORY — PX: COLONOSCOPY WITH PROPOFOL: SHX5780

## 2023-01-02 HISTORY — PX: POLYPECTOMY: SHX5525

## 2023-01-02 LAB — POCT I-STAT, CHEM 8
BUN: 31 mg/dL — ABNORMAL HIGH (ref 8–23)
Calcium, Ion: 1.05 mmol/L — ABNORMAL LOW (ref 1.15–1.40)
Chloride: 97 mmol/L — ABNORMAL LOW (ref 98–111)
Creatinine, Ser: 6.7 mg/dL — ABNORMAL HIGH (ref 0.44–1.00)
Glucose, Bld: 84 mg/dL (ref 70–99)
HCT: 33 % — ABNORMAL LOW (ref 36.0–46.0)
Hemoglobin: 11.2 g/dL — ABNORMAL LOW (ref 12.0–15.0)
Potassium: 4.3 mmol/L (ref 3.5–5.1)
Sodium: 141 mmol/L (ref 135–145)
TCO2: 32 mmol/L (ref 22–32)

## 2023-01-02 SURGERY — COLONOSCOPY WITH PROPOFOL
Anesthesia: Monitor Anesthesia Care

## 2023-01-02 MED ORDER — PROPOFOL 500 MG/50ML IV EMUL
INTRAVENOUS | Status: DC | PRN
  Administered 2023-01-02: 125 ug/kg/min via INTRAVENOUS

## 2023-01-02 MED ORDER — PROPOFOL 10 MG/ML IV BOLUS
INTRAVENOUS | Status: DC | PRN
  Administered 2023-01-02 (×2): 20 mg via INTRAVENOUS

## 2023-01-02 MED ORDER — LACTATED RINGERS IV SOLN: INTRAVENOUS | Status: DC

## 2023-01-02 MED ORDER — SODIUM CHLORIDE 0.9 % IV SOLN
INTRAVENOUS | Status: DC
  Administered 2023-01-02: 1000 mL via INTRAVENOUS

## 2023-01-02 MED ORDER — LIDOCAINE 2% (20 MG/ML) 5 ML SYRINGE
INTRAMUSCULAR | Status: DC | PRN
  Administered 2023-01-02: 80 mg via INTRAVENOUS

## 2023-01-02 MED ORDER — PROPOFOL 1000 MG/100ML IV EMUL
INTRAVENOUS | Status: AC
  Filled 2023-01-02: qty 100

## 2023-01-02 SURGICAL SUPPLY — 22 items

## 2023-01-02 NOTE — Transfer of Care (Signed)
Immediate Anesthesia Transfer of Care Note  Patient: Christina Vasquez  Procedure(s) Performed: COLONOSCOPY WITH PROPOFOL POLYPECTOMY  Patient Location: PACU  Anesthesia Type:MAC  Level of Consciousness: awake, alert , and oriented  Airway & Oxygen Therapy: Patient Spontanous Breathing and Patient connected to face mask oxygen  Post-op Assessment: Report given to RN and Post -op Vital signs reviewed and stable  Post vital signs: Reviewed and stable  Last Vitals:  Vitals Value Taken Time  BP    Temp    Pulse    Resp    SpO2      Last Pain:  Vitals:   01/02/23 1151  TempSrc: Temporal  PainSc: 0-No pain         Complications: No notable events documented.

## 2023-01-02 NOTE — Anesthesia Preprocedure Evaluation (Addendum)
Anesthesia Evaluation  Patient identified by MRN, date of birth, ID band Patient awake    Reviewed: Allergy & Precautions, NPO status , Patient's Chart, lab work & pertinent test results  Airway Mallampati: II  TM Distance: >3 FB Neck ROM: Full    Dental no notable dental hx.    Pulmonary sleep apnea    Pulmonary exam normal        Cardiovascular hypertension, Pt. on medications and Pt. on home beta blockers Normal cardiovascular exam     Neuro/Psych   Anxiety     negative neurological ROS     GI/Hepatic negative GI ROS, Neg liver ROS,,,  Endo/Other  negative endocrine ROS    Renal/GU ESRF and DialysisRenal disease (HD M, W, F)     Musculoskeletal  (+) Arthritis ,    Abdominal  (+) + obese  Peds  Hematology negative hematology ROS (+)   Anesthesia Other Findings screening  Reproductive/Obstetrics                             Anesthesia Physical Anesthesia Plan  ASA: 3  Anesthesia Plan: MAC   Post-op Pain Management:    Induction: Intravenous  PONV Risk Score and Plan: 2 and Propofol infusion and Treatment may vary due to age or medical condition  Airway Management Planned:   Additional Equipment:   Intra-op Plan:   Post-operative Plan:   Informed Consent: I have reviewed the patients History and Physical, chart, labs and discussed the procedure including the risks, benefits and alternatives for the proposed anesthesia with the patient or authorized representative who has indicated his/her understanding and acceptance.     Dental advisory given  Plan Discussed with: CRNA  Anesthesia Plan Comments:        Anesthesia Quick Evaluation

## 2023-01-02 NOTE — H&P (Signed)
Christina Vasquez HPI: The patient was identified to have a positive Cologuard.  Past Medical History:  Diagnosis Date   Anxiety    Arthritis    Hyperparathyroidism ([place])    Hypertension    PONV (postoperative nausea and vomiting)    Happened one time. Has had sx since then with no N/V   Renal disorder 12/19/2018   Stage 4 ESRD. HD-MWF   Sleep apnea     Past Surgical History:  Procedure Laterality Date   A/V FISTULAGRAM  2019   ABDOMINAL HYSTERECTOMY  2005   BREAST EXCISIONAL BIOPSY Left 2017   scar not visible   CHOLECYSTECTOMY  2002   COLONOSCOPY  2010   IR AV DIALY SHUNT INTRO NEEDLE/INTRAC INITIAL W/PTA/STENT/IMG LT  2021   Stent placed in Left Arm Fistula. Placed in [place] Right 06/01/2022   Procedure: REVERSE SHOULDER ARTHROPLASTY;  Surgeon: Hiram Gash, MD;  Location: [facility];  Service: Orthopedics;  Laterality: Right;   REVERSE SHOULDER ARTHROPLASTY Left 09/21/2022   Procedure: REVERSE SHOULDER ARTHROPLASTY;  Surgeon: Hiram Gash, MD;  Location: [facility];  Service: Orthopedics;  Laterality: Left;   TOTAL KNEE ARTHROPLASTY Right 2008    Family History  Problem Relation Age of Onset   Breast cancer Sister    Breast cancer Cousin        maternal first cousin    Social History:  reports that she has never smoked. She does not have any smokeless tobacco history on file. She reports that she does not currently use alcohol. She reports that she does not use drugs.  Allergies:  Allergies  Allergen Reactions   Tetracyclines & Related Hives    Medications: Scheduled: Continuous:  sodium chloride 1,000 mL (01/02/23 1205)   lactated ringers      Results for orders placed or performed during the hospital encounter of 01/02/23 (from the past 24 hour(s))  I-STAT, chem 8     Status: Abnormal   Collection Time: 01/02/23 12:12 PM  Result Value Ref Range   Sodium 141 135 - 145 mmol/L   Potassium 4.3 3.5 - 5.1 mmol/L   Chloride 97 (L) 98 -  111 mmol/L   BUN 31 (H) 8 - 23 mg/dL   Creatinine, Ser 6.70 (H) 0.44 - 1.00 mg/dL   Glucose, Bld 84 70 - 99 mg/dL   Calcium, Ion 1.05 (L) 1.15 - 1.40 mmol/L   TCO2 32 22 - 32 mmol/L   Hemoglobin 11.2 (L) 12.0 - 15.0 g/dL   HCT 33.0 (L) 36.0 - 46.0 %     No results found.  ROS:  As stated above in the HPI otherwise negative.  Blood pressure (!) 148/77, pulse 91, temperature 98 F (36.7 C), temperature source Temporal, resp. rate 20, height '5\' 2"'$  (1.575 m), weight 98 kg, SpO2 98 %.    PE: Gen: NAD, Alert and Oriented HEENT:  [place]/AT, EOMI Neck: Supple, no LAD Lungs: CTA Bilaterally CV: RRR without M/G/R ABD: Soft, NTND, +BS Ext: No C/C/E  Assessment/Plan: 1) Positive Cologuard - colonoscopy.  Christina Vasquez D 01/02/2023, 12:59 PM

## 2023-01-02 NOTE — Op Note (Signed)
[facility] Patient Name: Christina Vasquez Procedure Date: 01/02/2023 MRN: [id number] Attending MD: Carol Ada , MD, [id number] Date of Birth: [birth year] CSN: [id number] Age: 67 Admit Type: Outpatient Procedure:                Colonoscopy Indications:              Positive Cologuard test Providers:                Carol Ada, MD, Jamison Neighbor RN, RN, Fransico Setters                            [place], Technician Referring MD:              Medicines:                Propofol per Anesthesia Complications:            No immediate complications. Estimated Blood Loss:     Estimated blood loss: none. Procedure:                Pre-Anesthesia Assessment:                           - Prior to the procedure, a History and Physical                            was performed, and patient medications and                            allergies were reviewed. The patient's tolerance of                            previous anesthesia was also reviewed. The risks                            and benefits of the procedure and the sedation                            options and risks were discussed with the patient.                            All questions were answered, and informed consent                            was obtained. Prior Anticoagulants: The patient has                            taken no anticoagulant or antiplatelet agents. ASA                            Grade Assessment: III - A patient with severe                            systemic disease. After reviewing the risks and  benefits, the patient was deemed in satisfactory                            condition to undergo the procedure.                           - Sedation was administered by the nurse. Deep                            sedation was attained.                           After obtaining informed consent, the colonoscope                            was passed under direct vision. Throughout the                             procedure, the patient's blood pressure, pulse, and                            oxygen saturations were monitored continuously. The                            [id number] [id number]) Olympus colonoscope was                            introduced through the anus and advanced to the the                            cecum, identified by appendiceal orifice and                            ileocecal valve. The colonoscopy was somewhat                            difficult due to significant looping and a tortuous                            colon. Successful completion of the procedure was                            aided by using manual pressure and straightening                            and shortening the scope to obtain bowel loop                            reduction. The patient tolerated the procedure                            well. The quality of the bowel preparation was  evaluated using the BBPS [facility] Bowel Preparation                            Scale) with scores of: Right Colon = 3, Transverse                            Colon = 3 and Left Colon = 3 (entire mucosa seen                            well with no residual staining, small fragments of                            stool or opaque liquid). The total BBPS score                            equals 9. The ileocecal valve, appendiceal orifice,                            and rectum were photographed. Scope In: 1:16:51 PM Scope Out: 1:38:21 PM Scope Withdrawal Time: 0 hours 12 minutes 54 seconds  Total Procedure Duration: 0 hours 21 minutes 30 seconds  Findings:      Four sessile polyps were found in the sigmoid colon, descending colon       and ascending colon. The polyps were 2 to 5 mm in size. These polyps       were removed with a cold snare. Resection and retrieval were complete.      Scattered small-mouthed diverticula were found in the sigmoid colon. Impression:               - Four 2  to 5 mm polyps in the sigmoid colon, in                            the descending colon and in the ascending colon,                            removed with a cold snare. Resected and retrieved.                           - Diverticulosis in the sigmoid colon. Moderate Sedation:      Not Applicable - Patient had care per Anesthesia. Recommendation:           - Patient has a contact number available for                            emergencies. The signs and symptoms of potential                            delayed complications were discussed with the                            patient. Return to normal activities tomorrow.  Written discharge instructions were provided to the                            patient.                           - Resume previous diet.                           - Continue present medications.                           - Await pathology results.                           - Repeat colonoscopy in 5 years for surveillance. Procedure Code(s):        --- Professional ---                           [phone], Colonoscopy, flexible; with removal of                            tumor(s), polyp(s), or other lesion(s) by snare                            technique Diagnosis Code(s):        --- Professional ---                           D12.5, Benign neoplasm of sigmoid colon                           D12.4, Benign neoplasm of descending colon                           D12.2, Benign neoplasm of ascending colon                           R19.5, Other fecal abnormalities                           K57.30, Diverticulosis of large intestine without                            perforation or abscess without bleeding CPT copyright 2022 American Medical Association. All rights reserved. The codes documented in this report are preliminary and upon coder review may  be revised to meet current compliance requirements. Carol Ada, MD Carol Ada, MD 01/02/2023 1:54:53  PM This report has been signed electronically. Number of Addenda: 0

## 2023-01-03 ENCOUNTER — Encounter (HOSPITAL_COMMUNITY): Payer: Self-pay | Admitting: Gastroenterology

## 2023-01-03 LAB — SURGICAL PATHOLOGY

## 2023-01-03 NOTE — Anesthesia Postprocedure Evaluation (Signed)
Anesthesia Post Note  Patient: Christina Vasquez  Procedure(s) Performed: COLONOSCOPY WITH PROPOFOL POLYPECTOMY     Patient location during evaluation: Endoscopy Anesthesia Type: MAC Level of consciousness: awake Pain management: pain level controlled Vital Signs Assessment: post-procedure vital signs reviewed and stable Respiratory status: spontaneous breathing, nonlabored ventilation and respiratory function stable Cardiovascular status: blood pressure returned to baseline and stable Postop Assessment: no apparent nausea or vomiting Anesthetic complications: no   No notable events documented.  Last Vitals:  Vitals:   01/02/23 1355 01/02/23 1405  BP: (!) 106/52 (!) 112/51  Pulse: 83 80  Resp: (!) 23 14  Temp: (!) 36.2 C   SpO2: 93% 97%    Last Pain:  Vitals:   01/02/23 1405  TempSrc:   PainSc: 0-No pain                 Nargis Abrams P Tiffony Kite

## 2023-02-12 ENCOUNTER — Ambulatory Visit
Admission: RE | Admit: 2023-02-12 | Discharge: 2023-02-12 | Disposition: A | Discharge status: Home / Self Care | Payer: Medicare Other | Source: Ambulatory Visit | Attending: Family Medicine | Admitting: Family Medicine

## 2023-02-12 DIAGNOSIS — Z13820 Encounter for screening for osteoporosis: Secondary | ICD-10-CM

## 2023-04-06 ENCOUNTER — Other Ambulatory Visit: Payer: Self-pay

## 2023-04-06 ENCOUNTER — Emergency Department (HOSPITAL_COMMUNITY)
Admission: EM | Admit: 2023-04-06 | Discharge: 2023-04-07 | Disposition: A | Discharge status: Home / Self Care | Payer: Medicare Other | Attending: Emergency Medicine | Admitting: Emergency Medicine

## 2023-04-06 ENCOUNTER — Encounter (HOSPITAL_COMMUNITY): Payer: Self-pay | Admitting: Emergency Medicine

## 2023-04-06 DIAGNOSIS — Z79899 Other long term (current) drug therapy: Secondary | ICD-10-CM | POA: Diagnosis not present

## 2023-04-06 DIAGNOSIS — Y828 Other medical devices associated with adverse incidents: Secondary | ICD-10-CM | POA: Insufficient documentation

## 2023-04-06 DIAGNOSIS — T82590A Other mechanical complication of surgically created arteriovenous fistula, initial encounter: Secondary | ICD-10-CM | POA: Diagnosis present

## 2023-04-06 DIAGNOSIS — N186 End stage renal disease: Secondary | ICD-10-CM | POA: Insufficient documentation

## 2023-04-06 DIAGNOSIS — Z992 Dependence on renal dialysis: Secondary | ICD-10-CM | POA: Diagnosis not present

## 2023-04-06 DIAGNOSIS — I120 Hypertensive chronic kidney disease with stage 5 chronic kidney disease or end stage renal disease: Secondary | ICD-10-CM | POA: Diagnosis not present

## 2023-04-06 DIAGNOSIS — T829XXA Unspecified complication of cardiac and vascular prosthetic device, implant and graft, initial encounter: Secondary | ICD-10-CM

## 2023-04-06 LAB — CBC WITH DIFFERENTIAL/PLATELET
Abs Immature Granulocytes: 0.02 10*3/uL (ref 0.00–0.07)
Basophils Absolute: 0 10*3/uL (ref 0.0–0.1)
Basophils Relative: 0 %
Eosinophils Absolute: 0.1 10*3/uL (ref 0.0–0.5)
Eosinophils Relative: 1 %
HCT: 36 % (ref 36.0–46.0)
Hemoglobin: 11.6 g/dL — ABNORMAL LOW (ref 12.0–15.0)
Immature Granulocytes: 0 %
Lymphocytes Relative: 19 %
Lymphs Abs: 1.4 10*3/uL (ref 0.7–4.0)
MCH: 28.9 pg (ref 26.0–34.0)
MCHC: 32.2 g/dL (ref 30.0–36.0)
MCV: 89.6 fL (ref 80.0–100.0)
Monocytes Absolute: 0.6 10*3/uL (ref 0.1–1.0)
Monocytes Relative: 8 %
Neutro Abs: 5.1 10*3/uL (ref 1.7–7.7)
Neutrophils Relative %: 72 %
Platelets: 178 10*3/uL (ref 150–400)
RBC: 4.02 MIL/uL (ref 3.87–5.11)
RDW: 14.7 % (ref 11.5–15.5)
WBC: 7.2 10*3/uL (ref 4.0–10.5)
nRBC: 0 % (ref 0.0–0.2)

## 2023-04-06 LAB — COMPREHENSIVE METABOLIC PANEL
ALT: 16 U/L (ref 0–44)
AST: 19 U/L (ref 15–41)
Albumin: 4.1 g/dL (ref 3.5–5.0)
Alkaline Phosphatase: 86 U/L (ref 38–126)
Anion gap: 13 (ref 5–15)
BUN: 24 mg/dL — ABNORMAL HIGH (ref 8–23)
CO2: 31 mmol/L (ref 22–32)
Calcium: 9.7 mg/dL (ref 8.9–10.3)
Chloride: 94 mmol/L — ABNORMAL LOW (ref 98–111)
Creatinine, Ser: 4.59 mg/dL — ABNORMAL HIGH (ref 0.44–1.00)
GFR, Estimated: 10 mL/min — ABNORMAL LOW (ref >60)
Glucose, Bld: 94 mg/dL (ref 70–99)
Potassium: 4.4 mmol/L (ref 3.5–5.1)
Sodium: 138 mmol/L (ref 135–145)
Total Bilirubin: 0.5 mg/dL (ref 0.3–1.2)
Total Protein: 6.9 g/dL (ref 6.5–8.1)

## 2023-04-06 NOTE — ED Provider Notes (Signed)
[place] EMERGENCY DEPARTMENT AT [facility] Provider Note  CSN: [id number] Arrival date & time: 04/06/23 2105  Chief Complaint(s) Vascular Access Problem  HPI Christina Vasquez is a 67 y.o. female with a past medical history listed below including ESRD on dialysis MWF here for discomfort to the left AV fistula.  Patient states that she cannot feel a thrill.  States that she had dialysis earlier in the day and was able to complete her session.  When she noted the lack of thrill, she called the dialysis center.  She went in to get evaluated and they also did not feel a thrill.  She stated that they told her to come be evaluated.  No other physical complaints.  HPI  Past Medical History Past Medical History:  Diagnosis Date   Anxiety    Arthritis    Hyperparathyroidism ([facility])    Hypertension    PONV (postoperative nausea and vomiting)    Happened one time. Has had sx since then with no N/V   Renal disorder 12/19/2018   Stage 4 ESRD. 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 Authorizing Provider  acetaminophen (TYLENOL) 650 MG CR tablet Take 1,300 mg by mouth every 8 (eight) hours as needed for pain.    [provider]  amLODipine (NORVASC) 10 MG tablet Take 10 mg by mouth every evening. 03/10/22   [provider]  cinacalcet (SENSIPAR) 60 MG tablet Take 60 mg by mouth every Monday, Wednesday, and Friday.    [provider]  metoprolol succinate (TOPROL-XL) 100 MG 24 hr tablet Take 100 mg by mouth at bedtime. 03/10/22   [provider]  MOUNJARO 2.5 MG/0.5ML Pen Inject 2.5 mg into the skin every Sunday. 05/08/22   [provider]  sucroferric oxyhydroxide (VELPHORO) 500  MG chewable tablet Chew 1,500 mg by mouth 3 (three) times daily with meals. Take 3 tablets (1500 mg) by mouth daily with each meal    [provider]  venlafaxine (EFFEXOR) 37.5 MG tablet Take 37.5 mg by mouth every evening. 12/01/21   [provider]                                                                                                                                    Allergies Tetracyclines & related  Review of Systems Review of Systems As noted in HPI  Physical Exam Vital Signs  I have reviewed the triage vital signs BP (!) 144/101   Pulse (!) 110   Temp 99.9 F (37.7 C)   Resp 16   Ht 5\' 2"  (1.575 m)   Wt 98 kg   SpO2 97%  BMI 39.52 kg/m   Physical Exam Vitals reviewed.  Constitutional:      General: She is not in acute distress.    Appearance: She is well-developed. She is not diaphoretic.  HENT:     Head: Normocephalic and atraumatic.     Right Ear: External ear normal.     Left Ear: External ear normal.     Nose: Nose normal.  Eyes:     General: No scleral icterus.    Conjunctiva/sclera: Conjunctivae normal.  Neck:     Trachea: Phonation normal.  Cardiovascular:     Rate and Rhythm: Normal rate and regular rhythm.     Arteriovenous access: Left arteriovenous access is present.    Comments: AVF w/o thrill Pulmonary:     Effort: Pulmonary effort is normal. No respiratory distress.     Breath sounds: No stridor.  Abdominal:     General: There is no distension.  Musculoskeletal:        General: Normal range of motion.     Cervical back: Normal range of motion.  Neurological:     Mental Status: She is alert and oriented to person, place, and time.  Psychiatric:        Behavior: Behavior normal.     ED Results and Treatments Labs (all labs ordered are listed, but only abnormal results are displayed) Labs Reviewed  CBC WITH DIFFERENTIAL/PLATELET - Abnormal; Notable for the following components:      Result Value   Hemoglobin  11.6 (*)    All other components within normal limits  COMPREHENSIVE METABOLIC PANEL - Abnormal; Notable for the following components:   Chloride 94 (*)    BUN 24 (*)    Creatinine, Ser 4.59 (*)    GFR, Estimated 10 (*)    All other components within normal limits  CULTURE, BLOOD (ROUTINE X 2)  CULTURE, BLOOD (ROUTINE X 2)                                                                                                                         EKG  EKG Interpretation  Date/Time:    Ventricular Rate:    PR Interval:    QRS Duration:   QT Interval:    QTC Calculation:   R Axis:     Text Interpretation:         Radiology No results found.  Medications Ordered in ED Medications - No data to display Procedures Procedures  (including critical care time) Medical Decision Making / ED Course   Medical Decision Making Amount and/or Complexity of Data Reviewed Labs: ordered. Decision-making details documented in ED Course.    Left AV fistula complication  Likely clotted.  No evidence of pseudoaneurysm. Screening labs reassuring without leukocytosis.  Mild anemia. Metabolic panel with baseline renal function.  No electrolyte derangements. No vascular ultrasound at this time a night.  I spoke with Dr. Chestine Spore from vascular surgery who reports that the [facility] would be able to  address this.  I consulted Dr. Kathrene Bongo from nephrology who stated that she was aware of the patient and [facility] had already coordinated for the patient to go in on Monday.    Final Clinical Impression(s) / ED Diagnoses Final diagnoses:  Complication of arteriovenous dialysis fistula, initial encounter   The patient appears reasonably screened and/or stabilized for discharge and I doubt any other medical condition or other [facility] requiring further screening, evaluation, or treatment in the ED at this time. I have discussed the findings, Dx and Tx plan with the patient/family who expressed  understanding and agree(s) with the plan. Discharge instructions discussed at length. The patient/family was given strict return precautions who verbalized understanding of the instructions. No further questions at time of discharge.  Disposition: Discharge  Condition: Good  ED Discharge Orders     None         Follow Up: [facility]  Go on 04/09/2023 as scheduled    This chart was dictated using voice recognition software.  Despite best efforts to proofread,  errors can occur which can change the documentation meaning.    Nira Conn, MD 04/07/23 [phone]

## 2023-04-06 NOTE — ED Notes (Addendum)
Fistula felt and no frill was palpable. No bruit able to be auscultated. Patient reports she had a complete treatment today at dialysis, but reported staff advised no flow issues but she only had 2.1 liters pulled off, whereas her goal is 2.6. Patient also reports aching sensation to whole fistula, which is not common after treatment.

## 2023-04-06 NOTE — ED Provider Notes (Incomplete)
[place] EMERGENCY DEPARTMENT AT [facility] Provider Note  CSN: [id number] Arrival date & time: 04/06/23 2105  Chief Complaint(s) Vascular Access Problem  HPI Christina Vasquez is a 67 y.o. female {Add pertinent medical, surgical, social history, OB history to HPI:1}   HPI  Past Medical History Past Medical History:  Diagnosis Date  . Anxiety   . Arthritis   . Hyperparathyroidism (HCC)   . Hypertension   . PONV (postoperative nausea and vomiting)    Happened one time. Has had sx since then with no N/V  . Renal disorder 12/19/2018   Stage 4 ESRD. HD-MWF  . Sleep apnea    Patient Active Problem List   Diagnosis Date Noted  . 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 Authorizing Provider  acetaminophen (TYLENOL) 650 MG CR tablet Take 1,300 mg by mouth every 8 (eight) hours as needed for pain.    [provider]  amLODipine (NORVASC) 10 MG tablet Take 10 mg by mouth every evening. 03/10/22   [provider]  cinacalcet (SENSIPAR) 60 MG tablet Take 60 mg by mouth every Monday, Wednesday, and Friday.    [provider]  metoprolol succinate (TOPROL-XL) 100 MG 24 hr tablet Take 100 mg by mouth at bedtime. 03/10/22   [provider]  MOUNJARO 2.5 MG/0.5ML Pen Inject 2.5 mg into the skin every Sunday. 05/08/22   [provider]  sucroferric oxyhydroxide (VELPHORO) 500 MG chewable tablet Chew 1,500 mg by mouth 3 (three) times daily with meals. Take 3 tablets (1500 mg) by mouth daily with each meal    [provider]  venlafaxine (EFFEXOR) 37.5 MG tablet Take 37.5 mg by mouth every evening. 12/01/21   [provider]                                                                                                                                     Allergies Tetracyclines & related  Review of Systems Review of Systems As noted in HPI  Physical Exam Vital Signs  I have reviewed the triage vital signs BP (!) 144/101   Pulse (!) 110   Temp 99.9 F (37.7 C)   Resp 16   Wt 98 kg   SpO2 97%   BMI 39.52 kg/m  *** Physical Exam  ED Results and Treatments Labs (all labs ordered are listed, but only abnormal results are displayed) Labs Reviewed  CBC WITH DIFFERENTIAL/PLATELET - Abnormal; Notable for the following components:      Result Value   Hemoglobin 11.6 (*)    All other components within normal limits  COMPREHENSIVE METABOLIC PANEL - Abnormal; Notable for the following components:   Chloride 94 (*)    BUN 24 (*)    Creatinine,  Ser 4.59 (*)    GFR, Estimated 10 (*)    All other components within normal limits  CULTURE, BLOOD (ROUTINE X 2)  CULTURE, BLOOD (ROUTINE X 2)                                                                                                                         EKG  EKG Interpretation  Date/Time:    Ventricular Rate:    PR Interval:    QRS Duration:   QT Interval:    QTC Calculation:   R Axis:     Text Interpretation:         Radiology No results found.  Medications Ordered in ED Medications - No data to display Procedures Procedures  (including critical care time) Medical Decision Making / ED Course   Medical Decision Making Amount and/or Complexity of Data Reviewed Labs: ordered.    ***    Final Clinical Impression(s) / ED Diagnoses Final diagnoses:  None    This chart was dictated using voice recognition software.  Despite best efforts to proofread,  errors can occur which can change the documentation meaning.

## 2023-04-06 NOTE — ED Triage Notes (Signed)
Pt in with L arm dialysis fistula warmth, redness and swelling. Pt states she had normal full session this morning, but states she noticed she couldn't feel a thrill in it this afternoon. Went to her dialysis center, and they couldn't hear bruit. Sent here by on-call nephrologist Dr. Lacy Duverney

## 2023-04-06 NOTE — ED Notes (Signed)
Pt assessed by Dr. Adela Lank in triage

## 2023-04-11 LAB — CULTURE, BLOOD (ROUTINE X 2)
Culture: NO GROWTH
Culture: NO GROWTH
Special Requests: ADEQUATE
Special Requests: ADEQUATE

## 2023-04-16 ENCOUNTER — Other Ambulatory Visit: Payer: Self-pay | Admitting: *Deleted

## 2023-04-16 DIAGNOSIS — N186 End stage renal disease: Secondary | ICD-10-CM

## 2023-04-19 ENCOUNTER — Ambulatory Visit (HOSPITAL_COMMUNITY)
Admission: RE | Admit: 2023-04-19 | Discharge: 2023-04-19 | Disposition: A | Discharge status: Home / Self Care | Payer: Medicare Other | Source: Ambulatory Visit | Attending: Vascular Surgery | Admitting: Vascular Surgery

## 2023-04-19 ENCOUNTER — Ambulatory Visit (INDEPENDENT_AMBULATORY_CARE_PROVIDER_SITE_OTHER)
Admission: RE | Admit: 2023-04-19 | Discharge: 2023-04-19 | Disposition: A | Discharge status: Home / Self Care | Payer: Medicare Other | Source: Ambulatory Visit | Attending: Vascular Surgery | Admitting: Vascular Surgery

## 2023-04-19 DIAGNOSIS — N186 End stage renal disease: Secondary | ICD-10-CM | POA: Diagnosis present

## 2023-05-02 ENCOUNTER — Ambulatory Visit: Payer: Medicare Other | Admitting: Vascular Surgery

## 2023-05-02 ENCOUNTER — Encounter: Payer: Self-pay | Admitting: Vascular Surgery

## 2023-05-02 VITALS — BP 126/83 | HR 96 | Temp 98.1°F | Resp 20 | Ht 62.0 in | Wt 217.0 lb

## 2023-05-02 DIAGNOSIS — I808 Phlebitis and thrombophlebitis of other sites: Secondary | ICD-10-CM | POA: Diagnosis not present

## 2023-05-02 DIAGNOSIS — N186 End stage renal disease: Secondary | ICD-10-CM

## 2023-05-02 MED ORDER — AMOXICILLIN-POT CLAVULANATE 500-125 MG PO TABS: 500.0000 mg | ORAL_TABLET | Freq: Three times a day (TID) | ORAL | 0 refills | Status: DC

## 2023-05-02 NOTE — Progress Notes (Signed)
Patient ID: Christina Vasquez, female   DOB: 11-06-1956, 67 y.o.   MRN: [id number]  Reason for Consult: New Patient (Initial Visit)   Referred by Ethelene Hal, MD  Subjective:     HPI:  Christina Vasquez is a 67 y.o. female here for a ration of from left upper arm AV fistula.  This was placed in [facility] several years ago and she is now dialyzing via tunneled dialysis catheter.  She has taken a short course of antibiotics but continues to have pain in the left upper extremity with associated swelling.  She is right-hand dominant and has never had any intervention of the right upper extremity.  She denies fevers or chills  Past Medical History:  Diagnosis Date   Anxiety    Arthritis    Hyperparathyroidism (HCC)    Hypertension    PONV (postoperative nausea and vomiting)    Happened one time. Has had sx since then with no N/V   Renal disorder 12/19/2018   Stage 4 ESRD. HD-MWF   Sleep apnea    Family History  Problem Relation Age of Onset   Breast cancer Sister    Breast cancer Cousin        maternal first cousin   Past Surgical History:  Procedure Laterality Date   A/V FISTULAGRAM  2019   ABDOMINAL HYSTERECTOMY  2005   BREAST EXCISIONAL BIOPSY Left 2017   scar not visible   CHOLECYSTECTOMY  2002   COLONOSCOPY  2010   COLONOSCOPY WITH PROPOFOL N/A 01/02/2023   Procedure: COLONOSCOPY WITH PROPOFOL;  Surgeon: Jeani Hawking, MD;  Location: [facility];  Service: Gastroenterology;  Laterality: N/A;   IR AV DIALY SHUNT INTRO NEEDLE/INTRAC INITIAL W/PTA/STENT/IMG LT  2021   Stent placed in Left Arm Fistula. Placed in [place]   POLYPECTOMY  01/02/2023   Procedure: POLYPECTOMY;  Surgeon: Jeani Hawking, MD;  Location: [facility];  Service: Gastroenterology;;   REVERSE SHOULDER ARTHROPLASTY Right 06/01/2022   Procedure: REVERSE SHOULDER ARTHROPLASTY;  Surgeon: Bjorn Pippin, MD;  Location: [facility] OR;  Service: Orthopedics;  Laterality: Right;   REVERSE SHOULDER ARTHROPLASTY Left 09/21/2022    Procedure: REVERSE SHOULDER ARTHROPLASTY;  Surgeon: Bjorn Pippin, MD;  Location: [facility] OR;  Service: Orthopedics;  Laterality: Left;   TOTAL KNEE ARTHROPLASTY Right 2008    Short Social History:  Social History   Tobacco Use   Smoking status: Never   Smokeless tobacco: Not on file  Substance Use Topics   Alcohol use: Not Currently    Comment: rarely    Allergies  Allergen Reactions   Meloxicam Hives and Rash   Tetracyclines & Related Hives    Current Outpatient Medications  Medication Sig Dispense Refill   acetaminophen (TYLENOL) 650 MG CR tablet Take 1,300 mg by mouth every 8 (eight) hours as needed for pain.     amLODipine (NORVASC) 10 MG tablet Take 10 mg by mouth every evening.     cinacalcet (SENSIPAR) 60 MG tablet Take 60 mg by mouth every Monday, Wednesday, and Friday.     metoprolol succinate (TOPROL-XL) 100 MG 24 hr tablet Take 100 mg by mouth at bedtime.     MOUNJARO 2.5 MG/0.5ML Pen Inject 2.5 mg into the skin every Sunday.     sucroferric oxyhydroxide (VELPHORO) 500 MG chewable tablet Chew 1,500 mg by mouth 3 (three) times daily with meals. Take 3 tablets (1500 mg) by mouth daily with each meal     venlafaxine (EFFEXOR) 37.5 MG tablet Take 37.5 mg  by mouth every evening.     Current Facility-Administered Medications  Medication Dose Route Frequency Provider Last Rate Last Admin   diclofenac Sodium (VOLTAREN) 1 % topical gel 4 g  4 g Topical QID Huel Cote, MD        Review of Systems  Constitutional:  Constitutional negative. HENT: HENT negative.  Eyes: Eyes negative.  Cardiovascular: Cardiovascular negative.  GI: Gastrointestinal negative.  Musculoskeletal:       Left upper extremity swelling and pain with erythema Skin: Skin negative.  Neurological: Neurological negative. Hematologic: Hematologic/lymphatic negative.  Psychiatric: Psychiatric negative.        Objective:  Objective   Vitals:   05/02/23 1100  BP: 126/83  Pulse: 96  Resp: 20   Temp: 98.1 F (36.7 C)  SpO2: 94%  Weight: 217 lb (98.4 kg)  Height: 5\' 2"  (1.575 m)   Body mass index is 39.69 kg/m.  Physical Exam HENT:     Head: Normocephalic.  Eyes:     Pupils: Pupils are equal, round, and reactive to light.  Cardiovascular:     Rate and Rhythm: Normal rate.     Pulses: Normal pulses.  Pulmonary:     Effort: Pulmonary effort is normal.  Abdominal:     General: Abdomen is flat.  Skin:    Capillary Refill: Capillary refill takes less than 2 seconds.     Comments: Peau d'orange appearance around left arm AV fistula  Neurological:     Mental Status: She is alert.  Psychiatric:        Mood and Affect: Mood normal.        Thought Content: Thought content normal.        Judgment: Judgment normal.     Data: Right Cephalic   Diameter (cm)Depth (cm)Findings   +-----------------+-------------+----------+---------+  Prox upper arm       0.12        1.75              +-----------------+-------------+----------+---------+  Mid upper arm        0.11        1.01              +-----------------+-------------+----------+---------+  Dist upper arm       0.10        1.15              +-----------------+-------------+----------+---------+  Antecubital fossa    0.08        1.00              +-----------------+-------------+----------+---------+  Prox forearm         0.10        0.66              +-----------------+-------------+----------+---------+  Mid forearm          0.13        0.88   branching  +-----------------+-------------+----------+---------+  Dist forearm         0.10        0.51              +-----------------+-------------+----------+---------+   +-----------------+-------------+----------+---------+  Right Basilic    Diameter (cm)Depth (cm)Findings   +-----------------+-------------+----------+---------+  Prox upper arm       0.22        2.49               +-----------------+-------------+----------+---------+  Mid upper arm        0.23  2.49              +-----------------+-------------+----------+---------+  Dist upper arm       0.28        1.67              +-----------------+-------------+----------+---------+  Antecubital fossa    0.29        1.33              +-----------------+-------------+----------+---------+  Prox forearm         0.21        0.12   branching  +-----------------+-------------+----------+---------+  Mid forearm          0.14        0.11              +-----------------+-------------+----------+---------+  Distal forearm       0.13        0.29              +-----------------+-------------+----------+---------+   +-----------------+-------------+----------+--------------------+  Left Cephalic    Diameter (cm)Depth (cm)      Findings        +-----------------+-------------+----------+--------------------+  Prox upper arm                          occluded outflow AVF  +-----------------+-------------+----------+--------------------+  Mid upper arm                           occluded outflow AVF  +-----------------+-------------+----------+--------------------+  Dist upper arm                          occluded outflow AVF  +-----------------+-------------+----------+--------------------+  Antecubital fossa                       occluded outflow AVF  +-----------------+-------------+----------+--------------------+  Prox forearm                             partially occluded   +-----------------+-------------+----------+--------------------+  Mid forearm          0.19        0.65                         +-----------------+-------------+----------+--------------------+  Dist forearm         0.19        0.15                         +-----------------+-------------+----------+--------------------+    +-----------------+-------------+----------+--------------+  Left Basilic     Diameter (cm)Depth (cm)   Findings     +-----------------+-------------+----------+--------------+  Prox upper arm                          not visualized  +-----------------+-------------+----------+--------------+  Mid upper arm        0.49        2.73                   +-----------------+-------------+----------+--------------+  Dist upper arm       0.39        1.78                   +-----------------+-------------+----------+--------------+  Antecubital fossa    0.32  1.11     branching     +-----------------+-------------+----------+--------------+  Prox forearm         0.20        0.33                   +-----------------+-------------+----------+--------------+  Mid forearm          0.19        0.33                   +-----------------+-------------+----------+--------------+  Distal forearm       0.15        0.36                   +-----------------+-------------+----------+--------------+   Summary:  Left: Occluded Left Brachial cephalic AVF in the upper arm.    Right Pre-Dialysis Findings:  +-----------------------+----------+--------------------+---------+--------  +  Location              PSV (cm/s)Intralum. Diam. (cm)Waveform  Comments  +-----------------------+----------+--------------------+---------+--------  +  Brachial Antecub. [id number]        0.38                triphasic           +-----------------------+----------+--------------------+---------+--------  +  Radial Art at Wrist    70        0.22                triphasic           +-----------------------+----------+--------------------+---------+--------  +  Ulnar Art at Wrist     64        0.15                triphasic           +-----------------------+----------+--------------------+---------+--------  +   Left Pre-Dialysis Findings:   +-----------------------+----------+--------------------+---------+--------  +  Location              PSV (cm/s)Intralum. Diam. (cm)Waveform  Comments  +-----------------------+----------+--------------------+---------+--------  +  Brachial Antecub. [id number]       1.00                triphasic           +-----------------------+----------+--------------------+---------+--------  +  Radial Art at Wrist    77        0.22                triphasic           +-----------------------+----------+--------------------+---------+--------  +  Ulnar Art at Wrist     57        0.17                triphasic           +-----------------------+----------+--------------------+---------+--------  +       Assessment/Plan:     67 year old female with superficial thrombophlebitis after thrombosis of her left upper arm AV fistula.  I recommended no surgical intervention given the risk of nonhealing.  She is scheduled for total knee replacement on the left in late July with we can have this resolved by that time.  Ultimately she will need repeat access but I would not pursue any intervention at this time.  Given underlying thrombophlebitis I recommended warm compresses and Augmentin as well as NSAIDs for pain.  I will see her back in a few weeks and we can discuss proceeding with further access if she has resolved her current symptoms.  All questions were answered  she demonstrates good understanding.     Maeola Harman MD [facility]

## 2023-05-08 ENCOUNTER — Telehealth: Payer: Self-pay

## 2023-05-08 NOTE — Telephone Encounter (Signed)
Called pt for clarification, two identifiers used. Pt took approx 8 doses over the course of 4 days and had N/V regardless of taking with or without food. Pt reports thrombophlebitis has improved. She is elevating and using warm compresses.   Spoke with Marisue Humble, [place] who recommended that the pt stop taking the Augmentin, keep 7/24 appt, and keep using warm compresses. Called pt and informed her. Pt to call if symptoms worsen.   Added Augmentin to pt's allergy list as an intolerance.

## 2023-05-14 NOTE — Progress Notes (Signed)
Fax received from [facility] on 04/19/23 for medical clearance/medication hold for left total knee arthroplasty to be signed by B. Randie Heinz, MD.  Provider signed on 05/09/23, form faxed back to sender on 05/09/23, verified successful, sent to scan center.

## 2023-05-15 ENCOUNTER — Ambulatory Visit: Payer: Self-pay | Admitting: Emergency Medicine

## 2023-05-15 DIAGNOSIS — G8929 Other chronic pain: Secondary | ICD-10-CM

## 2023-05-15 NOTE — H&P (Signed)
TOTAL KNEE ADMISSION H&P  Patient is being admitted for left total knee arthroplasty.  Subjective:  Chief Complaint:left knee pain.  HPI: Christina Vasquez, 67 y.o. female, has a history of pain and functional disability in the left knee due to arthritis and has failed non-surgical conservative treatments for greater than 12 weeks to includeNSAID's and/or analgesics, corticosteriod injections, viscosupplementation injections, and activity modification.  Onset of symptoms was gradual, starting >10 years ago with gradually worsening course since that time. The patient noted no past surgery on the left knee(s).  Patient currently rates pain in the left knee(s) at 9 out of 10 with activity. Patient has night pain, worsening of pain with activity and weight bearing, pain that interferes with activities of daily living, and pain with passive range of motion.  Patient has evidence of periarticular osteophytes and joint space narrowing by imaging studies.  There is no active infection.  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 Has had sx since then with no N/V   Renal disorder 12/19/2018   Stage 4 ESRD. HD-MWF   Sleep apnea     Past Surgical History:  Procedure Laterality Date   A/V FISTULAGRAM  2019   ABDOMINAL HYSTERECTOMY  2005   BREAST EXCISIONAL BIOPSY Left 2017   scar not visible   CHOLECYSTECTOMY  2002   COLONOSCOPY  2010   COLONOSCOPY WITH PROPOFOL N/A 01/02/2023   Procedure: COLONOSCOPY WITH PROPOFOL;  Surgeon: Jeani Hawking, MD;  Location: [facility];  Service: Gastroenterology;  Laterality: N/A;   IR AV DIALY SHUNT INTRO NEEDLE/INTRAC INITIAL W/PTA/STENT/IMG LT   2021   Stent placed in Left Arm Fistula. Placed in [place]   POLYPECTOMY  01/02/2023   Procedure: POLYPECTOMY;  Surgeon: Jeani Hawking, MD;  Location: [facility];  Service: Gastroenterology;;   REVERSE SHOULDER ARTHROPLASTY Right 06/01/2022   Procedure: REVERSE SHOULDER ARTHROPLASTY;  Surgeon: Bjorn Pippin, MD;  Location: [facility];  Service: Orthopedics;  Laterality: Right;   REVERSE SHOULDER ARTHROPLASTY Left 09/21/2022   Procedure: REVERSE SHOULDER ARTHROPLASTY;  Surgeon: Bjorn Pippin, MD;  Location: [facility] OR;  Service: Orthopedics;  Laterality: Left;   TOTAL KNEE ARTHROPLASTY Right 2008    Current Outpatient Medications  Medication Sig Dispense Refill Last Dose   acetaminophen (TYLENOL) 650 MG CR tablet Take 1,300 mg by mouth every 8 (eight) hours as needed for pain.      amLODipine (NORVASC) 10 MG tablet Take 10 mg by mouth every evening.      amoxicillin-clavulanate (AUGMENTIN) 500-125 MG tablet Take 1 tablet by mouth 3 (three) times daily. 21 tablet 0    cinacalcet (SENSIPAR) 60 MG tablet Take 60 mg by mouth every Monday, Wednesday, and Friday.      metoprolol succinate (TOPROL-XL) 100 MG 24 hr tablet Take 100 mg by mouth at bedtime.      MOUNJARO 2.5 MG/0.5ML Pen Inject 2.5 mg into the skin every Sunday.      sucroferric oxyhydroxide (VELPHORO) 500 MG chewable tablet Chew 1,500 mg by mouth 3 (three) times daily with meals. Take 3 tablets (1500 mg) by mouth daily with each meal      venlafaxine (  EFFEXOR) 37.5 MG tablet Take 37.5 mg by mouth every evening.      Current Facility-Administered Medications  Medication Dose Route Frequency Provider Last Rate Last Admin   diclofenac Sodium (VOLTAREN) 1 % topical gel 4 g  4 g Topical QID Huel Cote, MD       Allergies  Allergen Reactions   Meloxicam Hives and Rash   Augmentin [Amoxicillin-Pot Clavulanate] Nausea And Vomiting   Tetracyclines & Related Hives    Social History   Tobacco Use   Smoking status: Never   Smokeless tobacco:  Not on file  Substance Use Topics   Alcohol use: Not Currently    Comment: rarely    Family History  Problem Relation Age of Onset   Breast cancer Sister    Breast cancer Cousin        maternal first cousin     Review of Systems  Musculoskeletal:  Positive for arthralgias.  All other systems reviewed and are negative.   Objective:  Physical Exam Constitutional:      General: She is not in acute distress.    Appearance: Normal appearance. She is not ill-appearing.  HENT:     Head: Normocephalic and atraumatic.     Right Ear: External ear normal.     Left Ear: External ear normal.     Nose: Nose normal.     Mouth/Throat:     Mouth: Mucous membranes are moist.     Pharynx: Oropharynx is clear.  Eyes:     Extraocular Movements: Extraocular movements intact.     Conjunctiva/sclera: Conjunctivae normal.  Cardiovascular:     Rate and Rhythm: Normal rate and regular rhythm.     Pulses: Normal pulses.     Heart sounds: Normal heart sounds.  Pulmonary:     Effort: Pulmonary effort is normal.     Breath sounds: Normal breath sounds.  Abdominal:     General: Bowel sounds are normal.     Palpations: Abdomen is soft.     Tenderness: There is no abdominal tenderness.  Musculoskeletal:        General: Tenderness present.     Cervical back: Normal range of motion and neck supple.     Comments: TTP over medial and lateral joint line, medial worse than lateral.  No calf tenderness, swelling, or erythema.  No overlying lesions of area of chief complaint.  Decreased strength and ROM due to elicited pain.  Pre-operative ROM 0-110.  Dorsiflexion and plantarflexion intact.  Stable to varus and valgus stress.  BLE appear grossly neurovascularly intact.  Gait mildly antalgic.   Skin:    General: Skin is warm and dry.  Neurological:     Mental Status: She is alert and oriented to person, place, and time. Mental status is at baseline.  Psychiatric:        Mood and Affect: Mood normal.         Behavior: Behavior normal.     Vital signs in last 24 hours: @VSRANGES @  Labs:   Estimated body mass index is 39.69 kg/m as calculated from the following:   Height as of 05/02/23: 5\' 2"  (1.575 m).   Weight as of 05/02/23: 98.4 kg.   Imaging Review Plain radiographs demonstrate severe degenerative joint disease of the left knee(s). The overall alignment ismild varus. The bone quality appears to be good for age and reported activity level.      Assessment/Plan:  End stage arthritis, left knee   The patient history, physical examination,  clinical judgment of the provider and imaging studies are consistent with end stage degenerative joint disease of the left knee(s) and total knee arthroplasty is deemed medically necessary. The treatment options including medical management, injection therapy arthroscopy and arthroplasty were discussed at length. The risks and benefits of total knee arthroplasty were presented and reviewed. The risks due to aseptic loosening, infection, stiffness, patella tracking problems, thromboembolic complications and other imponderables were discussed. The patient acknowledged the explanation, agreed to proceed with the plan and consent was signed. Patient is being admitted for inpatient treatment for surgery, pain control, PT, OT, prophylactic antibiotics, VTE prophylaxis, hemodialysis services, progressive ambulation and ADL's and discharge planning. The patient is planning to be discharged home with home health services [phone]).    Anticipated LOS equal to or greater than 2 midnights due to - Age 75 and older with one or more of the following:  - Obesity  - Expected need for hospital services (PT, OT, Nursing) required for safe  discharge  - Anticipated need for postoperative skilled nursing care or inpatient rehab  - Active co-morbidities: ESRD on hemodialysis, HTN, hyperparathyroidism, prior DVT, PICC line placement OR   - Unanticipated findings  during/Post Surgery: None  - Patient is a high risk of re-admission due to: None

## 2023-05-23 NOTE — Pre-Procedure Instructions (Signed)
Surgical Instructions   Your procedure is scheduled on Monday, July 29th. Report to [facility] Main Entrance "A" at 08:00 A.M., then check in with the Admitting office. Any questions or running late day of surgery: call [phone]  Questions prior to your surgery date: call [phone], Monday-Friday, 8am-4pm. If you experience any cold or flu symptoms such as cough, fever, chills, shortness of breath, etc. between now and your scheduled surgery, please notify us at the above number.     Remember:  Do not eat after midnight the night before your surgery  You may drink clear liquids until 07:00 AM the morning of your surgery.   Clear liquids allowed are: Water, Non-Citrus Juices (without pulp), Carbonated Beverages, Clear Tea, Black Coffee Only (NO MILK, CREAM OR POWDERED CREAMER of any kind), and Gatorade.   Patient Instructions  The night before surgery:  No food after midnight. ONLY clear liquids after midnight  The day of surgery (if you do NOT have diabetes):  Drink ONE (1) Pre-Surgery Clear Ensure by 07:00 AM the morning of surgery. Drink in one sitting. Do not sip.  This drink was given to you during your hospital  pre-op appointment visit.  Nothing else to drink after completing the  Pre-Surgery Clear Ensure.          If you have questions, please contact your surgeon's office.      Take these medicines the morning of surgery with A SIP OF WATER  venlafaxine XR [facility])    May take these medicines IF NEEDED: acetaminophen (TYLENOL)    One week prior to surgery, STOP taking any Aspirin (unless otherwise instructed by your surgeon) Aleve, Naproxen, Ibuprofen, Motrin, Advil, Goody's, BC's, all herbal medications, fish oil, and non-prescription vitamins.                     Do NOT Smoke (Tobacco/Vaping) for 24 hours prior to your procedure.  If you use a CPAP at night, you may bring your mask/headgear for your overnight stay.   You will be asked to remove  any contacts, glasses, piercing's, hearing aid's, dentures/partials prior to surgery. Please bring cases for these items if needed.    Patients discharged the day of surgery will not be allowed to drive home, and someone needs to stay with them for 24 hours.  SURGICAL WAITING ROOM VISITATION Patients may have no more than 2 support people in the waiting area - these visitors may rotate.   Pre-op nurse will coordinate an appropriate time for 1 ADULT support person, who may not rotate, to accompany patient in pre-op.  Children under the age of 73 must have an adult with them who is not the patient and must remain in the main waiting area with an adult.  If the patient needs to stay at the hospital during part of their recovery, the visitor guidelines for inpatient rooms apply.  Please refer to the [facility] website for the visitor guidelines for any additional information.   If you received a COVID test during your pre-op visit  it is requested that you wear a mask when out in public, stay away from anyone that may not be feeling well and notify your surgeon if you develop symptoms. If you have been in contact with anyone that has tested positive in the last 10 days please notify you surgeon.      Pre-operative 5 CHG Bathing Instructions   You can play a key role in reducing the risk of  infection after surgery. Your skin needs to be as free of germs as possible. You can reduce the number of germs on your skin by washing with CHG (chlorhexidine gluconate) soap before surgery. CHG is an antiseptic soap that kills germs and continues to kill germs even after washing.   DO NOT use if you have an allergy to chlorhexidine/CHG or antibacterial soaps. If your skin becomes reddened or irritated, stop using the CHG and notify one of our RNs at [phone].   Please shower with the CHG soap starting 4 days before surgery using the following schedule:     Please keep in mind the following:  DO NOT  shave, including legs and underarms, starting the day of your first shower.   You may shave your face at any point before/day of surgery.  Place clean sheets on your bed the day you start using CHG soap. Use a clean washcloth (not used since being washed) for each shower. DO NOT sleep with pets once you start using the CHG.   CHG Shower Instructions:  If you choose to wash your hair and private area, wash first with your normal shampoo/soap.  After you use shampoo/soap, rinse your hair and body thoroughly to remove shampoo/soap residue.  Turn the water OFF and apply about 3 tablespoons (45 ml) of CHG soap to a CLEAN washcloth.  Apply CHG soap ONLY FROM YOUR NECK DOWN TO YOUR TOES (washing for 3-5 minutes)  DO NOT use CHG soap on face, private areas, open wounds, or sores.  Pay special attention to the area where your surgery is being performed.  If you are having back surgery, having someone wash your back for you may be helpful. Wait 2 minutes after CHG soap is applied, then you may rinse off the CHG soap.  Pat dry with a clean towel  Put on clean clothes/pajamas   If you choose to wear lotion, please use ONLY the CHG-compatible lotions on the back of this paper.   Additional instructions for the day of surgery: DO NOT APPLY any lotions, deodorants, cologne, or perfumes.   Do not bring valuables to the hospital. [facility] is not responsible for any belongings/valuables. Do not wear nail polish, gel polish, artificial nails, or any other type of covering on natural nails (fingers and toes) Do not wear jewelry or makeup Put on clean/comfortable clothes.  Please brush your teeth.  Ask your nurse before applying any prescription medications to the skin.     CHG Compatible Lotions   Aveeno Moisturizing lotion  Cetaphil Moisturizing Cream  Cetaphil Moisturizing Lotion  Clairol Herbal Essence Moisturizing Lotion, Dry Skin  Clairol Herbal Essence Moisturizing Lotion, Extra Dry Skin   Clairol Herbal Essence Moisturizing Lotion, Normal Skin  Curel Age Defying Therapeutic Moisturizing Lotion with Alpha Hydroxy  Curel Extreme Care Body Lotion  Curel Soothing Hands Moisturizing Hand Lotion  Curel Therapeutic Moisturizing Cream, Fragrance-Free  Curel Therapeutic Moisturizing Lotion, Fragrance-Free  Curel Therapeutic Moisturizing Lotion, Original Formula  Eucerin Daily Replenishing Lotion  Eucerin Dry Skin Therapy Plus Alpha Hydroxy Crme  Eucerin Dry Skin Therapy Plus Alpha Hydroxy Lotion  Eucerin Original Crme  Eucerin Original Lotion  Eucerin Plus Crme Eucerin Plus Lotion  Eucerin TriLipid Replenishing Lotion  Keri Anti-Bacterial Hand Lotion  Keri Deep Conditioning Original Lotion Dry Skin Formula Softly Scented  Keri Deep Conditioning Original Lotion, Fragrance Free Sensitive Skin Formula  Keri Lotion Fast Absorbing Fragrance Free Sensitive Skin Formula  Keri Lotion Fast Absorbing Softly Scented Dry Skin  Formula  Keri Original Lotion  Keri Skin Renewal Lotion Keri Silky Smooth Lotion  Keri Silky Smooth Sensitive Skin Lotion  Nivea Body Creamy Conditioning Oil  Nivea Body Extra Enriched Lotion  Nivea Body Original Lotion  Nivea Body Sheer Moisturizing Lotion Nivea Crme  Nivea Skin Firming Lotion  NutraDerm 30 Skin Lotion  NutraDerm Skin Lotion  NutraDerm Therapeutic Skin Cream  NutraDerm Therapeutic Skin Lotion  ProShield Protective Hand Cream  Provon moisturizing lotion  Please read over the following fact sheets that you were given.

## 2023-05-24 ENCOUNTER — Encounter (HOSPITAL_COMMUNITY)
Admission: RE | Admit: 2023-05-24 | Discharge: 2023-05-24 | Disposition: A | Discharge status: Home / Self Care | Payer: Medicare Other | Source: Ambulatory Visit | Attending: Orthopedic Surgery | Admitting: Orthopedic Surgery

## 2023-05-24 ENCOUNTER — Encounter (HOSPITAL_COMMUNITY): Payer: Self-pay | Admitting: Vascular Surgery

## 2023-05-24 ENCOUNTER — Other Ambulatory Visit: Payer: Self-pay

## 2023-05-24 ENCOUNTER — Encounter (HOSPITAL_COMMUNITY): Payer: Self-pay

## 2023-05-24 VITALS — BP 150/84 | HR 100 | Temp 98.4°F | Resp 17 | Ht 62.0 in | Wt 217.4 lb

## 2023-05-24 DIAGNOSIS — Z01818 Encounter for other preprocedural examination: Secondary | ICD-10-CM | POA: Diagnosis not present

## 2023-05-24 DIAGNOSIS — M25562 Pain in left knee: Secondary | ICD-10-CM | POA: Insufficient documentation

## 2023-05-24 DIAGNOSIS — G8929 Other chronic pain: Secondary | ICD-10-CM | POA: Insufficient documentation

## 2023-05-24 DIAGNOSIS — I10 Essential (primary) hypertension: Secondary | ICD-10-CM | POA: Insufficient documentation

## 2023-05-24 HISTORY — DX: Anemia, unspecified: D64.9

## 2023-05-24 LAB — COMPREHENSIVE METABOLIC PANEL
ALT: 14 U/L (ref 0–44)
AST: 17 U/L (ref 15–41)
Albumin: 3 g/dL — ABNORMAL LOW (ref 3.5–5.0)
Alkaline Phosphatase: 56 U/L (ref 38–126)
Anion gap: 11 (ref 5–15)
BUN: 20 mg/dL (ref 8–23)
CO2: 29 mmol/L (ref 22–32)
Calcium: 9.4 mg/dL (ref 8.9–10.3)
Chloride: 96 mmol/L — ABNORMAL LOW (ref 98–111)
Creatinine, Ser: 4.93 mg/dL — ABNORMAL HIGH (ref 0.44–1.00)
GFR, Estimated: 9 mL/min — ABNORMAL LOW (ref >60)
Glucose, Bld: 88 mg/dL (ref 70–99)
Potassium: 3.8 mmol/L (ref 3.5–5.1)
Sodium: 136 mmol/L (ref 135–145)
Total Bilirubin: 0.5 mg/dL (ref 0.3–1.2)
Total Protein: 7.2 g/dL (ref 6.5–8.1)

## 2023-05-24 LAB — CBC WITH DIFFERENTIAL/PLATELET
Abs Immature Granulocytes: 0.05 10*3/uL (ref 0.00–0.07)
Basophils Absolute: 0 10*3/uL (ref 0.0–0.1)
Basophils Relative: 0 %
Eosinophils Absolute: 0.1 10*3/uL (ref 0.0–0.5)
Eosinophils Relative: 1 %
HCT: 22.8 % — ABNORMAL LOW (ref 36.0–46.0)
Hemoglobin: 7.1 g/dL — ABNORMAL LOW (ref 12.0–15.0)
Immature Granulocytes: 1 %
Lymphocytes Relative: 14 %
Lymphs Abs: 1.3 10*3/uL (ref 0.7–4.0)
MCH: 26.4 pg (ref 26.0–34.0)
MCHC: 31.1 g/dL (ref 30.0–36.0)
MCV: 84.8 fL (ref 80.0–100.0)
Monocytes Absolute: 0.6 10*3/uL (ref 0.1–1.0)
Monocytes Relative: 7 %
Neutro Abs: 6.9 10*3/uL (ref 1.7–7.7)
Neutrophils Relative %: 77 %
Platelets: 304 10*3/uL (ref 150–400)
RBC: 2.69 MIL/uL — ABNORMAL LOW (ref 3.87–5.11)
RDW: 15.2 % (ref 11.5–15.5)
WBC: 9 10*3/uL (ref 4.0–10.5)
nRBC: 0 % (ref 0.0–0.2)

## 2023-05-24 LAB — TYPE AND SCREEN
ABO/RH(D): B POS
Antibody Screen: NEGATIVE

## 2023-05-24 LAB — SURGICAL PCR SCREEN
MRSA, PCR: NEGATIVE
Staphylococcus aureus: NEGATIVE

## 2023-05-24 NOTE — Progress Notes (Signed)
LVM with [facility] End[facility], at Dr. Caron Presume office, informed her that pt's Hgb was 7.1 at PAT visit today

## 2023-05-24 NOTE — Progress Notes (Signed)
PAT RN called to report 05/24/23 H/H results 7.1/22.8.  This is down from 11.6/36.0 from 04/06/23; however, HGB 8.0 at the [facility] on 05/16/23. Evaluated by nephrologist Sabra Heck, MD that same day. He noted HGB up from 7.4 and thought issues with AVF may have contributed to anemia. She is on Fe and ESA (Mircera). He did order FOBT, but results pending. H/H called to Dr. Kyra Leyland office. I also called her HD center and spoke with nurse Jolene. Results faxed for nephrology provider to review. She will plan to have H/H repeated at 05/25/23 dialysis session.   Hemoglobin x 3 27.0 % 36.0 - 48.0 % Low April 25, 2023  HGB 9.0 g/dL 81.1 - 91.4 g/dL Low April 25, 2023  Hemoglobin x 3 22.2 % 36.0 - 48.0 % Low May 02, 2023  HGB 7.4 g/dL 78.2 - 95.6 g/dL Low May 02, 2023  Hemoglobin x 3 23.7 % 36.0 - 48.0 % Low May 09, 2023  HGB 7.9 g/dL 21.3 - 08.6 g/dL Low May 09, 2023  Transferrin Sat. (Calc) 14 % 20 - 55 % Low May 16, 2023  TIBC 229 mcg/dL 578 - 469 mcg/dL - May 16, 2023  UIBC (Calc) 197 mcg/dL 629 - 528 mcg/dL - May 16, 2023  Iron 32 mcg/dL 30 - 413 mcg/dL - May 16, 2023  Platelets 293 1000/mcL 130 - 400 1000/mcL - May 16, 2023  Retic HGB (CHr) 27.3 pg 25.4 - 31.8 pg - May 16, 2023  Ferritin 1844 ng/mL 10 - 291 ng/mL High May 16, 2023  RBC 2.94 mill/mcL 4.20 - 5.40 mill/mcL Low May 16, 2023  HCT 25.3 % 37.0 - 47.0 % Low May 16, 2023  MCH 27.2 pg 27.0 - 31.0 pg - May 16, 2023  MCHC 31.5 g/dL 24.4 - 01.0 g/dL - May 16, 2023  RDW 27.2 % 11.5 - 14.5 % High May 16, 2023  HGB 8.0 g/dL 53.6 - 64.4 g/dL Low May 16, 2023  Hemoglobin x 3 24.0 % 36.0 - 48.0 % Low May 16, 2023   Also LUE AVF recently thrombosed. She now has a right jugular TDC placed on 05/17/23. Reportedly she did notify Dr. Kyra Leyland scheduler. She goes to HD on MWF, but she is scheduled to have HD on Saturday 06/02/23 in anticipation of a Monday surgery. Will need to verify if surgeon  is okay with her having an dialysis catheter for a joint replacement surgery and will need to see improvement in her H/H.  Shonna Chock, PA-C Surgical Short Stay/Anesthesiology [facility] Phone [phone] [facility] Phone [phone] 05/24/2023 1:18 PM

## 2023-05-24 NOTE — Progress Notes (Signed)
PCP - Kathryne Eriksson, PA Cardiologist - denies Nephrologist- Dr. Sabra Heck  PPM/ICD - denies   Chest x-ray - 09/16/07 EKG - 05/24/23 Stress Test - 2019 ECHO - denies Cardiac Cath - denies  Sleep Study - OSA+ CPAP - denies, unable to tolerate  DM- denies  ASA/Blood Thinner Instructions: n/a   ERAS Protcol - yes PRE-SURGERY Ensure given at PAT  COVID TEST- n/a   Anesthesia review: yes. Revonda Standard, anesthesia PA, is aware of this pt. She had a fistula in the left arm for HD. Pt states it quit working and she had a chest catheter placed on 6/1. She said that wasn't working so 2 weeks ago they went in and "adjusted it" and it is working now. She said she was taking abx but was allergic so she stopped those and was not told she had to take anymore. She said it was not started to look worse. She said Dr. Blanchie Dessert is aware and she is not going to have the new fistula placed until after this surgery (no surgery scheduled for it at this time).  Patient denies shortness of breath, fever, cough and chest pain at PAT appointment   All instructions explained to the patient, with a verbal understanding of the material. Patient agrees to go over the instructions while at home for a better understanding.  The opportunity to ask questions was provided.

## 2023-05-30 ENCOUNTER — Other Ambulatory Visit: Payer: Self-pay

## 2023-05-30 ENCOUNTER — Encounter: Payer: Self-pay | Admitting: Vascular Surgery

## 2023-05-30 ENCOUNTER — Encounter: Payer: Self-pay | Admitting: *Deleted

## 2023-05-30 ENCOUNTER — Ambulatory Visit: Payer: Medicare Other | Admitting: Vascular Surgery

## 2023-05-30 VITALS — BP 167/94 | HR 92 | Temp 98.1°F | Resp 20 | Ht 62.0 in | Wt 216.0 lb

## 2023-05-30 DIAGNOSIS — N186 End stage renal disease: Secondary | ICD-10-CM

## 2023-05-30 NOTE — Progress Notes (Signed)
Patient ID: Christina Vasquez, female   DOB: 1956-03-03, 67 y.o.   MRN: [id number]  Reason for Consult: Follow-up   Referred by Lorne Skeens, MD  Subjective:     HPI:  Christina Vasquez is a 68 y.o. female history of left upper extremity AV fistula she had thrombophlebitis associated with now has persistent pain.  She was scheduled for total knee replacement which has been canceled due to anemia.  She states that she has persistent pain in the left arm and is dialyzing Mondays, Wednesdays and Fridays via right IJ tunnel dialysis catheter.  Past Medical History:  Diagnosis Date   Anemia    Anxiety    Arthritis    Hyperparathyroidism (HCC)    Hypertension    PONV (postoperative nausea and vomiting)    Happened one time. Has had sx since then with no N/V   Renal disorder 12/19/2018   Stage 4 ESRD. HD-MWF   Sleep apnea    Family History  Problem Relation Age of Onset   Breast cancer Sister    Breast cancer Cousin        maternal first cousin   Past Surgical History:  Procedure Laterality Date   A/V FISTULAGRAM  2019   ABDOMINAL HYSTERECTOMY  2005   BREAST EXCISIONAL BIOPSY Left 2017   scar not visible   CHOLECYSTECTOMY  2002   COLONOSCOPY  2010   COLONOSCOPY WITH PROPOFOL N/A 01/02/2023   Procedure: COLONOSCOPY WITH PROPOFOL;  Surgeon: Jeani Hawking, MD;  Location: [facility];  Service: Gastroenterology;  Laterality: N/A;   IR AV DIALY SHUNT INTRO NEEDLE/INTRAC INITIAL W/PTA/STENT/IMG LT  2021   Stent placed in Left Arm Fistula. Placed in [place]   POLYPECTOMY  01/02/2023   Procedure: POLYPECTOMY;  Surgeon: Jeani Hawking, MD;  Location: [facility];  Service: Gastroenterology;;   REVERSE SHOULDER ARTHROPLASTY Right 06/01/2022   Procedure: REVERSE SHOULDER ARTHROPLASTY;  Surgeon: Bjorn Pippin, MD;  Location: [facility] OR;  Service: Orthopedics;  Laterality: Right;   REVERSE SHOULDER ARTHROPLASTY Left 09/21/2022   Procedure: REVERSE SHOULDER ARTHROPLASTY;  Surgeon: Bjorn Pippin,  MD;  Location: [facility] OR;  Service: Orthopedics;  Laterality: Left;   TOTAL KNEE ARTHROPLASTY Right 2008   TUBAL LIGATION      Short Social History:  Social History   Tobacco Use   Smoking status: Never   Smokeless tobacco: Never  Substance Use Topics   Alcohol use: Not Currently    Allergies  Allergen Reactions   Meloxicam Hives and Rash   Augmentin [Amoxicillin-Pot Clavulanate] Nausea And Vomiting   Tetracyclines & Related Hives    Current Outpatient Medications  Medication Sig Dispense Refill   acetaminophen (TYLENOL) 650 MG CR tablet Take 1,300 mg by mouth every 8 (eight) hours as needed for pain.     cinacalcet (SENSIPAR) 60 MG tablet Take 60 mg by mouth every Monday, Wednesday, and Friday.     metoprolol succinate (TOPROL-XL) 100 MG 24 hr tablet Take 50 mg by mouth at bedtime.     venlafaxine XR (EFFEXOR-XR) 37.5 MG 24 hr capsule Take 37.5 mg by mouth daily with breakfast.     Current Facility-Administered Medications  Medication Dose Route Frequency Provider Last Rate Last Admin   diclofenac Sodium (VOLTAREN) 1 % topical gel 4 g  4 g Topical QID Huel Cote, MD        Review of Systems  Constitutional:  Constitutional negative. HENT: HENT negative.  Eyes: Eyes negative.  Respiratory: Respiratory negative.  Cardiovascular: Cardiovascular negative.  GI: Gastrointestinal negative.  Musculoskeletal:       Left arm pain Skin:       Erythema fistula Neurological: Neurological negative. Hematologic:       anemia Psychiatric: Psychiatric negative.        Objective:  Objective   Vitals:   05/30/23 1028  BP: (!) 167/94  Pulse: 92  Resp: 20  Temp: 98.1 F (36.7 C)  SpO2: 94%  Weight: 216 lb (98 kg)  Height: 5\' 2"  (1.575 m)   Body mass index is 39.51 kg/m.  Physical Exam HENT:     Head: Normocephalic.  Eyes:     Pupils: Pupils are equal, round, and reactive to light.  Cardiovascular:     Pulses:          Radial pulses are 2+ on the right side and  2+ on the left side.  Pulmonary:     Effort: Pulmonary effort is normal.  Abdominal:     General: Abdomen is flat.  Musculoskeletal:        General: Swelling present.  Skin:    Capillary Refill: Capillary refill takes less than 2 seconds.     Findings: Erythema present.  Neurological:     General: No focal deficit present.     Mental Status: She is alert.  Psychiatric:        Mood and Affect: Mood normal.        Thought Content: Thought content normal.        Judgment: Judgment normal.     Data: No new studies     Assessment/Plan:     67 year old female previously thrombophlebitis of her left upper extremity AV fistula catheter was placed.  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  As such I have recommended ligation of the AV fistula and hopeful can remove some of this thrombus to decrease the size of the fistula at the time of procedure.  Will get her scheduled ASAP.     Maeola Harman MD [facility]

## 2023-05-30 NOTE — H&P (View-Only) (Signed)
Patient ID: Christina Vasquez, female   DOB: 08-23-1956, 67 y.o.   MRN: [id number]  Reason for Consult: Follow-up   Referred by Lorne Skeens, MD  Subjective:     HPI:  Christina Vasquez is a 67 y.o. female history of left upper extremity AV fistula she had thrombophlebitis associated with now has persistent pain.  She was scheduled for total knee replacement which has been canceled due to anemia.  She states that she has persistent pain in the left arm and is dialyzing Mondays, Wednesdays and Fridays via right IJ tunnel dialysis catheter.  Past Medical History:  Diagnosis Date   Anemia    Anxiety    Arthritis    Hyperparathyroidism (HCC)    Hypertension    PONV (postoperative nausea and vomiting)    Happened one time. Has had sx since then with no N/V   Renal disorder 12/19/2018   Stage 4 ESRD. HD-MWF   Sleep apnea    Family History  Problem Relation Age of Onset   Breast cancer Sister    Breast cancer Cousin        maternal first cousin   Past Surgical History:  Procedure Laterality Date   A/V FISTULAGRAM  2019   ABDOMINAL HYSTERECTOMY  2005   BREAST EXCISIONAL BIOPSY Left 2017   scar not visible   CHOLECYSTECTOMY  2002   COLONOSCOPY  2010   COLONOSCOPY WITH PROPOFOL N/A 01/02/2023   Procedure: COLONOSCOPY WITH PROPOFOL;  Surgeon: Jeani Hawking, MD;  Location: [facility];  Service: Gastroenterology;  Laterality: N/A;   IR AV DIALY SHUNT INTRO NEEDLE/INTRAC INITIAL W/PTA/STENT/IMG LT  2021   Stent placed in Left Arm Fistula. Placed in [place]   POLYPECTOMY  01/02/2023   Procedure: POLYPECTOMY;  Surgeon: Jeani Hawking, MD;  Location: [facility];  Service: Gastroenterology;;   REVERSE SHOULDER ARTHROPLASTY Right 06/01/2022   Procedure: REVERSE SHOULDER ARTHROPLASTY;  Surgeon: Bjorn Pippin, MD;  Location: [facility] OR;  Service: Orthopedics;  Laterality: Right;   REVERSE SHOULDER ARTHROPLASTY Left 09/21/2022   Procedure: REVERSE SHOULDER ARTHROPLASTY;  Surgeon: Bjorn Pippin,  MD;  Location: [facility] OR;  Service: Orthopedics;  Laterality: Left;   TOTAL KNEE ARTHROPLASTY Right 2008   TUBAL LIGATION      Short Social History:  Social History   Tobacco Use   Smoking status: Never   Smokeless tobacco: Never  Substance Use Topics   Alcohol use: Not Currently    Allergies  Allergen Reactions   Meloxicam Hives and Rash   Augmentin [Amoxicillin-Pot Clavulanate] Nausea And Vomiting   Tetracyclines & Related Hives    Current Outpatient Medications  Medication Sig Dispense Refill   acetaminophen (TYLENOL) 650 MG CR tablet Take 1,300 mg by mouth every 8 (eight) hours as needed for pain.     cinacalcet (SENSIPAR) 60 MG tablet Take 60 mg by mouth every Monday, Wednesday, and Friday.     metoprolol succinate (TOPROL-XL) 100 MG 24 hr tablet Take 50 mg by mouth at bedtime.     venlafaxine XR (EFFEXOR-XR) 37.5 MG 24 hr capsule Take 37.5 mg by mouth daily with breakfast.     Current Facility-Administered Medications  Medication Dose Route Frequency Provider Last Rate Last Admin   diclofenac Sodium (VOLTAREN) 1 % topical gel 4 g  4 g Topical QID Huel Cote, MD        Review of Systems  Constitutional:  Constitutional negative. HENT: HENT negative.  Eyes: Eyes negative.  Respiratory: Respiratory negative.  Cardiovascular: Cardiovascular negative.  GI: Gastrointestinal negative.  Musculoskeletal:       Left arm pain Skin:       Erythema fistula Neurological: Neurological negative. Hematologic:       anemia Psychiatric: Psychiatric negative.        Objective:  Objective   Vitals:   05/30/23 1028  BP: (!) 167/94  Pulse: 92  Resp: 20  Temp: 98.1 F (36.7 C)  SpO2: 94%  Weight: 216 lb (98 kg)  Height: 5\' 2"  (1.575 m)   Body mass index is 39.51 kg/m.  Physical Exam HENT:     Head: Normocephalic.  Eyes:     Pupils: Pupils are equal, round, and reactive to light.  Cardiovascular:     Pulses:          Radial pulses are 2+ on the right side and  2+ on the left side.  Pulmonary:     Effort: Pulmonary effort is normal.  Abdominal:     General: Abdomen is flat.  Musculoskeletal:        General: Swelling present.  Skin:    Capillary Refill: Capillary refill takes less than 2 seconds.     Findings: Erythema present.  Neurological:     General: No focal deficit present.     Mental Status: She is alert.  Psychiatric:        Mood and Affect: Mood normal.        Thought Content: Thought content normal.        Judgment: Judgment normal.     Data: No new studies     Assessment/Plan:     67 year old female previously thrombophlebitis of her left upper extremity AV fistula catheter was placed.  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  As such I have recommended ligation of the AV fistula and hopeful can remove some of this thrombus to decrease the size of the fistula at the time of procedure.  Will get her scheduled ASAP.     Maeola Harman MD [facility]

## 2023-05-31 NOTE — Progress Notes (Signed)
Spoke with the pt, to arrive tom at 1145, to stop clear liquids at 1115.

## 2023-06-01 ENCOUNTER — Encounter (HOSPITAL_COMMUNITY)
Admission: RE | Disposition: A | Discharge status: Home / Self Care | Payer: Self-pay | Source: Home / Self Care | Attending: Vascular Surgery

## 2023-06-01 ENCOUNTER — Encounter (HOSPITAL_COMMUNITY): Payer: Self-pay | Admitting: Vascular Surgery

## 2023-06-01 ENCOUNTER — Ambulatory Visit (HOSPITAL_BASED_OUTPATIENT_CLINIC_OR_DEPARTMENT_OTHER): Payer: Medicare Other | Admitting: Anesthesiology

## 2023-06-01 ENCOUNTER — Ambulatory Visit (HOSPITAL_COMMUNITY): Payer: Medicare Other | Admitting: Anesthesiology

## 2023-06-01 ENCOUNTER — Other Ambulatory Visit: Payer: Self-pay

## 2023-06-01 ENCOUNTER — Ambulatory Visit (HOSPITAL_COMMUNITY)
Admission: RE | Admit: 2023-06-01 | Discharge: 2023-06-01 | Disposition: A | Discharge status: Home / Self Care | Payer: Medicare Other | Attending: Vascular Surgery | Admitting: Vascular Surgery

## 2023-06-01 DIAGNOSIS — I120 Hypertensive chronic kidney disease with stage 5 chronic kidney disease or end stage renal disease: Secondary | ICD-10-CM | POA: Diagnosis not present

## 2023-06-01 DIAGNOSIS — Z992 Dependence on renal dialysis: Secondary | ICD-10-CM | POA: Diagnosis not present

## 2023-06-01 DIAGNOSIS — T82868A Thrombosis of vascular prosthetic devices, implants and grafts, initial encounter: Secondary | ICD-10-CM | POA: Diagnosis present

## 2023-06-01 DIAGNOSIS — Y832 Surgical operation with anastomosis, bypass or graft as the cause of abnormal reaction of the patient, or of later complication, without mention of misadventure at the time of the procedure: Secondary | ICD-10-CM | POA: Diagnosis not present

## 2023-06-01 DIAGNOSIS — N186 End stage renal disease: Secondary | ICD-10-CM

## 2023-06-01 DIAGNOSIS — M1990 Unspecified osteoarthritis, unspecified site: Secondary | ICD-10-CM | POA: Insufficient documentation

## 2023-06-01 DIAGNOSIS — G4730 Sleep apnea, unspecified: Secondary | ICD-10-CM | POA: Insufficient documentation

## 2023-06-01 DIAGNOSIS — F419 Anxiety disorder, unspecified: Secondary | ICD-10-CM | POA: Insufficient documentation

## 2023-06-01 HISTORY — PX: LIGATION OF ARTERIOVENOUS  FISTULA: SHX5948

## 2023-06-01 LAB — POCT I-STAT, CHEM 8
BUN: 12 mg/dL (ref 8–23)
Calcium, Ion: 0.9 mmol/L — ABNORMAL LOW (ref 1.15–1.40)
Chloride: 97 mmol/L — ABNORMAL LOW (ref 98–111)
Creatinine, Ser: 3.6 mg/dL — ABNORMAL HIGH (ref 0.44–1.00)
Glucose, Bld: 98 mg/dL (ref 70–99)
HCT: 27 % — ABNORMAL LOW (ref 36.0–46.0)
Hemoglobin: 9.2 g/dL — ABNORMAL LOW (ref 12.0–15.0)
Potassium: 3.8 mmol/L (ref 3.5–5.1)
Sodium: 137 mmol/L (ref 135–145)
TCO2: 32 mmol/L (ref 22–32)

## 2023-06-01 LAB — ABO/RH: ABO/RH(D): B POS

## 2023-06-01 SURGERY — LIGATION OF ARTERIOVENOUS  FISTULA
Anesthesia: General | Laterality: Left

## 2023-06-01 MED ORDER — ONDANSETRON HCL 4 MG/2ML IJ SOLN: 4.0000 mg | Freq: Four times a day (QID) | INTRAMUSCULAR | Status: DC | PRN

## 2023-06-01 MED ORDER — OXYCODONE HCL 5 MG PO TABS: 5.0000 mg | ORAL_TABLET | Freq: Once | ORAL | Status: DC | PRN

## 2023-06-01 MED ORDER — FENTANYL CITRATE (PF) 100 MCG/2ML IJ SOLN: 25.0000 ug | Freq: Once | INTRAMUSCULAR | Status: AC

## 2023-06-01 MED ORDER — MIDAZOLAM HCL 2 MG/2ML IJ SOLN
INTRAMUSCULAR | Status: DC | PRN
  Administered 2023-06-01: 2 mg via INTRAVENOUS

## 2023-06-01 MED ORDER — ORAL CARE MOUTH RINSE: 15.0000 mL | Freq: Once | OROMUCOSAL | Status: AC

## 2023-06-01 MED ORDER — PROPOFOL 10 MG/ML IV BOLUS
INTRAVENOUS | Status: DC | PRN
  Administered 2023-06-01: 130 mg via INTRAVENOUS
  Administered 2023-06-01: 30 ug/kg/min via INTRAVENOUS

## 2023-06-01 MED ORDER — LIDOCAINE 2% (20 MG/ML) 5 ML SYRINGE
INTRAMUSCULAR | Status: DC | PRN
  Administered 2023-06-01: 40 mg via INTRAVENOUS

## 2023-06-01 MED ORDER — FENTANYL CITRATE (PF) 100 MCG/2ML IJ SOLN: 25.0000 ug | INTRAMUSCULAR | Status: DC | PRN

## 2023-06-01 MED ORDER — SODIUM CHLORIDE 0.9 % IV SOLN: INTRAVENOUS | Status: DC

## 2023-06-01 MED ORDER — OXYCODONE-ACETAMINOPHEN 5-325 MG PO TABS: 1.0000 | ORAL_TABLET | Freq: Four times a day (QID) | ORAL | 0 refills | Status: DC | PRN

## 2023-06-01 MED ORDER — FENTANYL CITRATE (PF) 250 MCG/5ML IJ SOLN
INTRAMUSCULAR | Status: DC | PRN
  Administered 2023-06-01: 50 ug via INTRAVENOUS
  Administered 2023-06-01: 25 ug via INTRAVENOUS
  Administered 2023-06-01 (×2): 50 ug via INTRAVENOUS
  Administered 2023-06-01 (×2): 25 ug via INTRAVENOUS

## 2023-06-01 MED ORDER — CHLORHEXIDINE GLUCONATE 0.12 % MT SOLN
15.0000 mL | Freq: Once | OROMUCOSAL | Status: AC
  Administered 2023-06-01: 15 mL via OROMUCOSAL
  Filled 2023-06-01: qty 15

## 2023-06-01 MED ORDER — CHLORHEXIDINE GLUCONATE 4 % EX SOLN: 60.0000 mL | Freq: Once | CUTANEOUS | Status: DC

## 2023-06-01 MED ORDER — FENTANYL CITRATE (PF) 250 MCG/5ML IJ SOLN
INTRAMUSCULAR | Status: AC
  Filled 2023-06-01: qty 5

## 2023-06-01 MED ORDER — ONDANSETRON HCL 4 MG/2ML IJ SOLN
INTRAMUSCULAR | Status: DC | PRN
Start: 2023-06-01 — End: 2023-06-01
  Administered 2023-06-01: 4 mg via INTRAVENOUS

## 2023-06-01 MED ORDER — FENTANYL CITRATE (PF) 100 MCG/2ML IJ SOLN
INTRAMUSCULAR | Status: AC
  Administered 2023-06-01: 25 ug via INTRAVENOUS
  Filled 2023-06-01: qty 2

## 2023-06-01 MED ORDER — HEPARIN 6000 UNIT IRRIGATION SOLUTION
Status: DC | PRN
  Administered 2023-06-01: 1

## 2023-06-01 MED ORDER — HEPARIN 6000 UNIT IRRIGATION SOLUTION
Status: AC
  Filled 2023-06-01: qty 500

## 2023-06-01 MED ORDER — DEXAMETHASONE SODIUM PHOSPHATE 10 MG/ML IJ SOLN
INTRAMUSCULAR | Status: DC | PRN
  Administered 2023-06-01: 5 mg via INTRAVENOUS

## 2023-06-01 MED ORDER — VANCOMYCIN HCL IN DEXTROSE 1-5 GM/200ML-% IV SOLN
1000.0000 mg | INTRAVENOUS | Status: AC
  Administered 2023-06-01: 1000 mg via INTRAVENOUS
  Filled 2023-06-01: qty 200

## 2023-06-01 MED ORDER — 0.9 % SODIUM CHLORIDE (POUR BTL) OPTIME
TOPICAL | Status: DC | PRN
  Administered 2023-06-01: 1000 mL

## 2023-06-01 MED ORDER — LACTATED RINGERS IV SOLN: INTRAVENOUS | Status: DC

## 2023-06-01 MED ORDER — MIDAZOLAM HCL 2 MG/2ML IJ SOLN
INTRAMUSCULAR | Status: AC
  Filled 2023-06-01: qty 2

## 2023-06-01 MED ORDER — OXYCODONE HCL 5 MG/5ML PO SOLN: 5.0000 mg | Freq: Once | ORAL | Status: DC | PRN

## 2023-06-01 SURGICAL SUPPLY — 40 items
ADH SKN CLS APL DERMABOND .7 (GAUZE/BANDAGES/DRESSINGS) ×1
BAG COUNTER SPONGE SURGICOUNT (BAG) ×1 IMPLANT
BAG SPNG CNTER NS LX DISP (BAG) ×1
BNDG CMPR 5X4 CHSV STRCH STRL (GAUZE/BANDAGES/DRESSINGS) ×1
BNDG CMPR MED 15X6 ELC VLCR LF (GAUZE/BANDAGES/DRESSINGS) ×1
BNDG COHESIVE 4X5 TAN STRL LF (GAUZE/BANDAGES/DRESSINGS) IMPLANT
BNDG ELASTIC 6X15 VLCR STRL LF (GAUZE/BANDAGES/DRESSINGS) IMPLANT
BNDG GAUZE DERMACEA FLUFF 4 (GAUZE/BANDAGES/DRESSINGS) IMPLANT
BNDG GZE DERMACEA 4 6PLY (GAUZE/BANDAGES/DRESSINGS) ×1
CANISTER SUCT 3000ML PPV (MISCELLANEOUS) ×1 IMPLANT
CLIP LIGATING EXTRA MED SLVR (CLIP) ×1 IMPLANT
CLIP LIGATING EXTRA SM BLUE (MISCELLANEOUS) ×1 IMPLANT
DERMABOND ADVANCED .7 DNX12 (GAUZE/BANDAGES/DRESSINGS) ×1 IMPLANT
DRSG EMULSION OIL 3X3 NADH (GAUZE/BANDAGES/DRESSINGS) IMPLANT
ELECT REM PT RETURN 9FT ADLT (ELECTROSURGICAL) ×1
ELECTRODE REM PT RTRN 9FT ADLT (ELECTROSURGICAL) ×1 IMPLANT
GAUZE SPONGE 4X4 12PLY STRL LF (GAUZE/BANDAGES/DRESSINGS) IMPLANT
GLOVE BIO SURGEON STRL SZ7.5 (GLOVE) ×1 IMPLANT
GOWN STRL REUS W/ TWL LRG LVL3 (GOWN DISPOSABLE) ×2 IMPLANT
GOWN STRL REUS W/ TWL XL LVL3 (GOWN DISPOSABLE) ×1 IMPLANT
GOWN STRL REUS W/TWL LRG LVL3 (GOWN DISPOSABLE) ×2
GOWN STRL REUS W/TWL XL LVL3 (GOWN DISPOSABLE) ×1
KIT BASIN OR (CUSTOM PROCEDURE TRAY) ×1 IMPLANT
KIT TURNOVER KIT B (KITS) ×1 IMPLANT
NS IRRIG 1000ML POUR BTL (IV SOLUTION) ×1 IMPLANT
PACK CV ACCESS (CUSTOM PROCEDURE TRAY) ×1 IMPLANT
PAD ARMBOARD 7.5X6 YLW CONV (MISCELLANEOUS) ×2 IMPLANT
POWDER SURGICEL 3.0 GRAM (HEMOSTASIS) IMPLANT
SPONGE T-LAP 18X18 ~~LOC~~+RFID (SPONGE) IMPLANT
STAPLER VISISTAT 35W (STAPLE) IMPLANT
SUT ETHILON 3 0 PS 1 (SUTURE) IMPLANT
SUT MNCRL AB 4-0 PS2 18 (SUTURE) ×1 IMPLANT
SUT PROLENE 5 0 C 1 24 (SUTURE) IMPLANT
SUT PROLENE 6 0 BV (SUTURE) IMPLANT
SUT SILK 0 TIES 10X30 (SUTURE) ×1 IMPLANT
SUT VIC AB 3-0 SH 27 (SUTURE) ×2
SUT VIC AB 3-0 SH 27X BRD (SUTURE) ×1 IMPLANT
TOWEL GREEN STERILE (TOWEL DISPOSABLE) ×1 IMPLANT
UNDERPAD 30X36 HEAVY ABSORB (UNDERPADS AND DIAPERS) ×1 IMPLANT
WATER STERILE IRR 1000ML POUR (IV SOLUTION) ×1 IMPLANT

## 2023-06-01 NOTE — Anesthesia Preprocedure Evaluation (Signed)
Anesthesia Evaluation  Patient identified by MRN, date of birth, ID band Patient awake    Reviewed: Allergy & Precautions, H&P , NPO status , Patient's Chart, lab work & pertinent test results  History of Anesthesia Complications (+) PONV and history of anesthetic complications  Airway Mallampati: II   Neck ROM: full    Dental   Pulmonary sleep apnea    breath sounds clear to auscultation       Cardiovascular hypertension,  Rhythm:regular Rate:Normal     Neuro/Psych   Anxiety        GI/Hepatic   Endo/Other    Renal/GU ESRF and DialysisRenal disease     Musculoskeletal  (+) Arthritis ,    Abdominal   Peds  Hematology  (+) Blood dyscrasia, anemia   Anesthesia Other Findings   Reproductive/Obstetrics                             Anesthesia Physical Anesthesia Plan  ASA: 3  Anesthesia Plan: General   Post-op Pain Management:    Induction: Intravenous  PONV Risk Score and Plan: 4 or greater and Ondansetron, Dexamethasone, Midazolam and Treatment may vary due to age or medical condition  Airway Management Planned: LMA  Additional Equipment:   Intra-op Plan:   Post-operative Plan: Extubation in OR  Informed Consent: I have reviewed the patients History and Physical, chart, labs and discussed the procedure including the risks, benefits and alternatives for the proposed anesthesia with the patient or authorized representative who has indicated his/her understanding and acceptance.     Dental advisory given  Plan Discussed with: CRNA, Anesthesiologist and Surgeon  Anesthesia Plan Comments:        Anesthesia Quick Evaluation

## 2023-06-01 NOTE — Interval H&P Note (Signed)
History and Physical Interval Note:  06/01/2023 2:34 PM  Christina Vasquez  has presented today for surgery, with the diagnosis of ESRD.  The various methods of treatment have been discussed with the patient and family. After consideration of risks, benefits and other options for treatment, the patient has consented to  Procedure(s): LIGATION OF LEFT ARM ARTERIOVENOUS  FISTULA (Left) as a surgical intervention.  The patient's history has been reviewed, patient examined, no change in status, stable for surgery.  I have reviewed the patient's chart and labs.  Questions were answered to the patient's satisfaction.     Lemar Livings

## 2023-06-01 NOTE — Anesthesia Procedure Notes (Signed)
Procedure Name: LMA Insertion Date/Time: 06/01/2023 2:55 PM  Performed by: Loleta Cianni Manny, CRNAPre-anesthesia Checklist: Patient identified, Patient being monitored, Timeout performed, Emergency Drugs available and Suction available Patient Re-evaluated:Patient Re-evaluated prior to induction Oxygen Delivery Method: Circle system utilized Preoxygenation: Pre-oxygenation with 100% oxygen Induction Type: IV induction Ventilation: Mask ventilation without difficulty LMA: LMA inserted LMA Size: 4.0 Tube type: Oral Number of attempts: 1 Placement Confirmation: positive ETCO2 and breath sounds checked- equal and bilateral Tube secured with: Tape Dental Injury: Teeth and Oropharynx as per pre-operative assessment

## 2023-06-01 NOTE — Discharge Instructions (Signed)
[facility]  Discharge Instructions  AV Fistula or Graft Surgery for Dialysis Access  Please refer to the following instructions for your post-procedure care. Your surgeon or physician assistant will discuss any changes with you.  Activity  You may drive the day following your surgery, if you are comfortable and no longer taking prescription pain medication. Resume full activity as the soreness in your incision resolves.  Bathing/Showering  You may shower after you go home. Keep your incision dry for 48 hours. Do not soak in a bathtub, hot tub, or swim until the incision heals completely. You may not shower if you have a hemodialysis catheter.  Incision Care  Clean your incision with mild soap and water after 48 hours. Pat the area dry with a clean towel. You do not need a bandage unless otherwise instructed. Do not apply any ointments or creams to your incision. You may have skin glue on your incision. Do not peel it off. It will come off on its own in about one week. Your arm may swell a bit after surgery. To reduce swelling use pillows to elevate your arm so it is above your heart. Your doctor will tell you if you need to lightly wrap your arm with an ACE bandage.  Diet  Resume your normal diet. There are not special food restrictions following this procedure. In order to heal from your surgery, it is CRITICAL to get adequate nutrition. Your body requires vitamins, minerals, and protein. Vegetables are the best source of vitamins and minerals. Vegetables also provide the perfect balance of protein. Processed food has little nutritional value, so try to avoid this.  Medications  Resume taking all of your medications. If your incision is causing pain, you may take over-the counter pain relievers such as acetaminophen (Tylenol). If you were prescribed a stronger pain medication, please be aware these medications can cause nausea and constipation. Prevent  nausea by taking the medication with a snack or meal. Avoid constipation by drinking plenty of fluids and eating foods with high amount of fiber, such as fruits, vegetables, and grains.  Do not take Tylenol if you are taking prescription pain medications.  Follow up Your surgeon may want to see you in the office following your access surgery. If so, this will be arranged at the time of your surgery.  Please call us immediately for any of the following conditions:  Increased pain, redness, drainage (pus) from your incision site Fever of 101 degrees or higher Severe or worsening pain at your incision site Hand pain or numbness.  Reduce your risk of vascular disease:  Stop smoking. If you would like help, call [facility] at 1-800-QUIT-NOW (610-283-4890) or [place] at 431-388-2473  Manage your cholesterol Maintain a desired weight Control your diabetes Keep your blood pressure down  Dialysis  It will take several weeks to several months for your new dialysis access to be ready for use. Your surgeon will determine when it is okay to use it. Your nephrologist will continue to direct your dialysis. You can continue to use your Permcath until your new access is ready for use.   06/01/2023 Christina Vasquez [id number] [birth year]  Surgeon(s): Maeola Harman, MD  Procedure(s): LIGATION OF LEFT ARM ARTERIOVENOUS  FISTULA WITH HEMATOMA EVACUATION   May stick graft immediately   May stick graft on designated area only:   X Do not stick left AV fistula- now ligated    **Use TDC only for dialysis **  If you have any questions, please call the office at [phone].

## 2023-06-01 NOTE — Transfer of Care (Signed)
Immediate Anesthesia Transfer of Care Note  Patient: Christina Vasquez  Procedure(s) Performed: LIGATION OF LEFT ARM ARTERIOVENOUS  FISTULA WITH HEMATOMA EVACUATION (Left)  Patient Location: PACU  Anesthesia Type:General  Level of Consciousness: drowsy  Airway & Oxygen Therapy: Patient Spontanous Breathing and Patient connected to face mask oxygen  Post-op Assessment: Report given to RN and Post -op Vital signs reviewed and stable  Post vital signs: Reviewed and stable  Last Vitals:  Vitals Value Taken Time  BP 164/89 06/01/23 1558  Temp    Pulse 96   Resp 16 06/01/23 1600  SpO2 100   Vitals shown include unfiled device data.  Last Pain:  Vitals:   06/01/23 1152  TempSrc:   PainSc: 10-Worst pain ever         Complications: No notable events documented.

## 2023-06-01 NOTE — Op Note (Signed)
    Patient name: Christina Vasquez MRN: [id number] DOB: 04-24-1956 Sex: female  06/01/2023 Pre-operative Diagnosis: End-stage renal disease, partial thrombosis left arm AV fistula Post-operative diagnosis:  Same Surgeon:  Luanna Salk. Randie Heinz, MD Assistant: Nathanial Rancher, PA Procedure Performed:  Ligation left arm AV fistula with thrombectomy of 2 large pseudoaneurysms  Indications: 67 year old female on dialysis via dialysis catheter previously was on dialysis via left upper arm AV fistula.  She had an episode of superficial thrombophlebitis and now has mostly thrombosed left upper arm fistula and has exquisite pain and warmth to the touch and is indicated for ligation of fistula and removal of thrombus from the pseudoaneurysmal cavities.  Experience assistant was necessary to facilitate exposure of the fistula proximally and distally and remove the significant amount of chronic thrombus.  Findings: Fistula had been banded proximally and was ligated at this level.  Was also ligated further towards the axilla to prevent any backbleeding and due to large pseudoaneurysm cavity.  We removed significant amounts of chronic thrombus from the pseudoaneurysms at completion the arm was much softer and was wrapped with an Ace wrap.   Procedure:  The patient was identified in the holding area and taken to the operating room where she is placed supine operative table and LMA anesthesia was induced.  She was sterilely prepped and draped in the left upper extremity in usual fashion, antibiotics were administered a timeout was called.  I began by reopening her old incision near the antecubitum and dissected down to the proximal fistula.  We did have to extend the incision to get better visualization.  There was an area where the fistula had clearly been banded in the past and encircled this there with a vessel loop.  A counterincision was made toward the axilla we dissected out the fistula there.  We then clamped the  fistula at both levels and then transected it at both levels.  We removed significant amounts of chronic thrombus until the arm was actually flat where it once been.  Edematous and hard.  We then irrigated the pseudoaneurysms.  I could not identify any branch points to suggest there was a backbleeding issue.  I then oversewed the proximal and distal fistulas with running 5-0 Prolene suture in mattress fashion.  After this was done there was a palpable radial artery pulse at the wrist confirmed with Doppler.  We then irrigated all the wounds and closed the antecubital wound with interrupted 3-0 Vicryl followed by staples and the axillary wound was closed with running 3-0 Vicryl followed by staples.  Sterile dressing was applied and the arm was wrapped with Ace wrap.  The patient was awakened from anesthesia having tolerated the procedure without any complication.  All counts were correct at completion.  EBL: 200 cc   Suriya Kovarik C. Randie Heinz, MD [facility] [place] Office: [phone] Pager: [phone]

## 2023-06-02 ENCOUNTER — Inpatient Hospital Stay: Payer: Medicare Other

## 2023-06-02 ENCOUNTER — Encounter (HOSPITAL_COMMUNITY): Payer: Self-pay | Admitting: Vascular Surgery

## 2023-06-02 ENCOUNTER — Inpatient Hospital Stay: Payer: Medicare Other | Attending: Hematology and Oncology | Admitting: Hematology and Oncology

## 2023-06-02 NOTE — Anesthesia Postprocedure Evaluation (Signed)
Anesthesia Post Note  Patient: Christina Vasquez  Procedure(s) Performed: LIGATION OF LEFT ARM ARTERIOVENOUS  FISTULA WITH HEMATOMA EVACUATION (Left)     Patient location during evaluation: PACU Anesthesia Type: General Level of consciousness: awake and alert Pain management: pain level controlled Vital Signs Assessment: post-procedure vital signs reviewed and stable Respiratory status: spontaneous breathing, nonlabored ventilation, respiratory function stable and patient connected to nasal cannula oxygen Cardiovascular status: blood pressure returned to baseline and stable Postop Assessment: no apparent nausea or vomiting Anesthetic complications: no   No notable events documented.  Last Vitals:  Vitals:   06/01/23 1615 06/01/23 1630  BP: (!) 159/87 (!) 161/83  Pulse: 94 92  Resp: 10 12  Temp:    SpO2: 98% 95%    Last Pain:  Vitals:   06/01/23 1630  TempSrc:   PainSc: 0-No pain                 Jolanta Cabeza S

## 2023-06-02 NOTE — Progress Notes (Unsigned)
[facility] [phone]   [phone]  INITIAL CONSULT NOTE  Patient Care Team: Tamala Ser as PCP - General (Physician Assistant) Center, [facility]  Hematological/Oncological History # Iron Deficiency Anemia  # Anemia in Setting of ESRD  05/24/2023: WBC 9.0, Hgb 7.1, MCV 84.4, Plt 304 06/02/2023: establish care with Dr. Leonides Schanz   CHIEF COMPLAINTS/PURPOSE OF CONSULTATION:  "Iron Deficiency Anemia in Setting of ESRD "  HISTORY OF PRESENTING ILLNESS:  Christina Vasquez 67 y.o. female with medical history significant for ***  On review of the previous records ***  On exam today ***  MEDICAL HISTORY:  Past Medical History:  Diagnosis Date   Anemia    Anxiety    Arthritis    Hyperparathyroidism (HCC)    Hypertension    PONV (postoperative nausea and vomiting)    Happened one time. Has had sx since then with no N/V   Renal disorder 12/19/2018   Stage 4 ESRD. HD-MWF   Sleep apnea     SURGICAL HISTORY: Past Surgical History:  Procedure Laterality Date   A/V FISTULAGRAM  2019   ABDOMINAL HYSTERECTOMY  2005   BREAST EXCISIONAL BIOPSY Left 2017   scar not visible   CHOLECYSTECTOMY  2002   COLONOSCOPY  2010   COLONOSCOPY WITH PROPOFOL N/A 01/02/2023   Procedure: COLONOSCOPY WITH PROPOFOL;  Surgeon: Jeani Hawking, MD;  Location: [facility];  Service: Gastroenterology;  Laterality: N/A;   IR AV DIALY SHUNT INTRO NEEDLE/INTRAC INITIAL W/PTA/STENT/IMG LT  2021   Stent placed in Left Arm Fistula. Placed in [place]   LIGATION OF ARTERIOVENOUS  FISTULA Left 06/01/2023   Procedure: LIGATION OF LEFT ARM ARTERIOVENOUS  FISTULA WITH HEMATOMA EVACUATION;  Surgeon: Maeola Harman, MD;  Location: [facility] OR;  Service: Vascular;  Laterality: Left;   POLYPECTOMY  01/02/2023   Procedure: POLYPECTOMY;  Surgeon: Jeani Hawking, MD;  Location: [facility];  Service: Gastroenterology;;   REVERSE SHOULDER ARTHROPLASTY Right 06/01/2022    Procedure: REVERSE SHOULDER ARTHROPLASTY;  Surgeon: Bjorn Pippin, MD;  Location: [facility] OR;  Service: Orthopedics;  Laterality: Right;   REVERSE SHOULDER ARTHROPLASTY Left 09/21/2022   Procedure: REVERSE SHOULDER ARTHROPLASTY;  Surgeon: Bjorn Pippin, MD;  Location: [facility] OR;  Service: Orthopedics;  Laterality: Left;   TOTAL KNEE ARTHROPLASTY Right 2008   TUBAL LIGATION      SOCIAL HISTORY: Social History   Socioeconomic History   Marital status: Widowed    Spouse name: Not on file   Number of children: 2   Years of education: Not on file   Highest education level: Not on file  Occupational History   Not on file  Tobacco Use   Smoking status: Never   Smokeless tobacco: Never  Vaping Use   Vaping status: Never Used  Substance and Sexual Activity   Alcohol use: Not Currently   Drug use: Never   Sexual activity: Not Currently  Other Topics Concern   Not on file  Social History Narrative   Not on file   Social Determinants of Health   Financial Resource Strain: Low Risk  (07/04/2018)   Received from [facility], [facility]   Overall Financial Resource Strain (CARDIA)    Difficulty of Paying Living Expenses: Not hard at all  Food Insecurity: No Food Insecurity (07/04/2018)   Received from [facility], [facility]   Hunger Vital Sign    Worried About Running Out of Food in the  Last Year: Never true    Ran Out of Food in the Last Year: Never true  Transportation Needs: No Transportation Needs (07/04/2018)   Received from [facility], [facility]   PRAPARE - Transportation    Lack of Transportation (Medical): No    Lack of Transportation (Non-Medical): No  Physical Activity: Unknown (07/04/2018)   Received from [facility], [facility]   Exercise Vital Sign    Days of Exercise per Week: 0 days    Minutes of Exercise per Session: Not on  file  Stress: Stress Concern Present (07/04/2018)   Received from [facility], [facility]   [employer] of Occupational Health - Occupational Stress Questionnaire    Feeling of Stress : Very much  Social Connections: Moderately Isolated (07/04/2018)   Received from [facility], [facility]   Social Connection and Isolation Panel [NHANES]    Frequency of Communication with Friends and Family: Twice a week    Frequency of Social Gatherings with Friends and Family: Never    Attends Religious Services: More than 4 times per year    Active Member of [employer] or Organizations: No    Attends Banker Meetings: Never    Marital Status: Widowed  Intimate Partner Violence: Not At Risk (01/27/2020)   Received from [facility]   Humiliation, Afraid, Rape, and Kick questionnaire    Fear of Current or Ex-Partner: No    Emotionally Abused: No    Physically Abused: No    Sexually Abused: No    FAMILY HISTORY: Family History  Problem Relation Age of Onset   Breast cancer Sister    Breast cancer Cousin        maternal first cousin    ALLERGIES:  is allergic to meloxicam, augmentin [amoxicillin-pot clavulanate], and tetracyclines & related.  MEDICATIONS:  Current Outpatient Medications  Medication Sig Dispense Refill   acetaminophen (TYLENOL) 650 MG CR tablet Take 1,300 mg by mouth every 8 (eight) hours as needed for pain.     cinacalcet (SENSIPAR) 60 MG tablet Take 60 mg by mouth every Monday, Wednesday, and Friday.     metoprolol succinate (TOPROL-XL) 100 MG 24 hr tablet Take 50 mg by mouth at bedtime.     oxyCODONE-acetaminophen (PERCOCET) 5-325 MG tablet Take 1 tablet by mouth every 6 (six) hours as needed for severe pain. 24 tablet 0   venlafaxine XR (EFFEXOR-XR) 37.5 MG 24 hr capsule Take 37.5 mg by mouth daily with breakfast.     Current Facility-Administered Medications  Medication Dose Route  Frequency Provider Last Rate Last Admin   diclofenac Sodium (VOLTAREN) 1 % topical gel 4 g  4 g Topical QID Huel Cote, MD        REVIEW OF SYSTEMS:   Constitutional: ( - ) fevers, ( - )  chills , ( - ) night sweats Eyes: ( - ) blurriness of vision, ( - ) double vision, ( - ) watery eyes Ears, nose, mouth, throat, and face: ( - ) mucositis, ( - ) sore throat Respiratory: ( - ) cough, ( - ) dyspnea, ( - ) wheezes Cardiovascular: ( - ) palpitation, ( - ) chest discomfort, ( - ) lower extremity swelling Gastrointestinal:  ( - ) nausea, ( - ) heartburn, ( - ) change in bowel habits Skin: ( - ) abnormal skin rashes Lymphatics: ( - ) new lymphadenopathy, ( - )  easy bruising Neurological: ( - ) numbness, ( - ) tingling, ( - ) new weaknesses Behavioral/Psych: ( - ) mood change, ( - ) new changes  All other systems were reviewed with the patient and are negative.  PHYSICAL EXAMINATION:  There were no vitals filed for this visit. There were no vitals filed for this visit.  GENERAL: well appearing *** in NAD  SKIN: skin color, texture, turgor are normal, no rashes or significant lesions EYES: conjunctiva are pink and non-injected, sclera clear LUNGS: clear to auscultation and percussion with normal breathing effort HEART: regular rate & rhythm and no murmurs and no lower extremity edema Musculoskeletal: no cyanosis of digits and no clubbing  PSYCH: alert & oriented x 3, fluent speech NEURO: no focal motor/sensory deficits  LABORATORY DATA:  I have reviewed the data as listed    Latest Ref Rng & Units 06/01/2023   12:24 PM 05/24/2023    9:09 AM 04/06/2023    9:37 PM  CBC  WBC 4.0 - 10.5 K/uL  9.0  7.2   Hemoglobin 12.0 - 15.0 g/dL 9.2  7.1  82.9   Hematocrit 36.0 - 46.0 % 27.0  22.8  36.0   Platelets 150 - 400 K/uL  304  178        Latest Ref Rng & Units 06/01/2023   12:24 PM 05/24/2023    9:09 AM 04/06/2023    9:37 PM  CMP  Glucose 70 - 99 mg/dL 98  88  94   BUN 8 - 23 mg/dL  12  20  24    Creatinine 0.44 - 1.00 mg/dL 5.62  1.30  8.65   Sodium 135 - 145 mmol/L 137  136  138   Potassium 3.5 - 5.1 mmol/L 3.8  3.8  4.4   Chloride 98 - 111 mmol/L 97  96  94   CO2 22 - 32 mmol/L  29  31   Calcium 8.9 - 10.3 mg/dL  9.4  9.7   Total Protein 6.5 - 8.1 g/dL  7.2  6.9   Total Bilirubin 0.3 - 1.2 mg/dL  0.5  0.5   Alkaline Phos 38 - 126 U/L  56  86   AST 15 - 41 U/L  17  19   ALT 0 - 44 U/L  14  16      ASSESSMENT & PLAN ***  After review of the labs, review of the records, and discussion with the patient the patients findings are most consistent with ***  No orders of the defined types were placed in this encounter.   All questions were answered. The patient knows to call the clinic with any problems, questions or concerns.  A total of more than 60 minutes were spent on this encounter with face-to-face time and non-face-to-face time, including preparing to see the patient, ordering tests and/or medications, counseling the patient and coordination of care as outlined above.   Ulysees Barns, MD Department of Hematology/Oncology [facility] Phone: [phone] Pager: [phone] Email: [email or web address]  06/02/2023 8:11 AM

## 2023-06-04 ENCOUNTER — Ambulatory Visit (HOSPITAL_COMMUNITY): Admit: 2023-06-04 | Payer: Medicare Other | Admitting: Orthopedic Surgery

## 2023-06-04 ENCOUNTER — Telehealth: Payer: Self-pay

## 2023-06-04 SURGERY — ARTHROPLASTY, KNEE, TOTAL
Anesthesia: Choice | Site: Knee | Laterality: Left

## 2023-06-04 NOTE — Telephone Encounter (Signed)
Pt called stating that she took the ACE bandage off to change the bandages underneath. It was causing some itching and she wasn't sure if she needed to replace it or not.  Reviewed pt's chart, returned call for clarification, two identifiers used. Informed her that she could remove it after the first 24 hours and replace it if she was having swelling. Recommended that she put some kerlex on her arm and then wrap with ACE if she needed some compression for swelling. She stated that she would rather use elevation instead. Confirmed understanding.

## 2023-06-10 ENCOUNTER — Encounter (HOSPITAL_COMMUNITY): Payer: Self-pay

## 2023-06-10 ENCOUNTER — Emergency Department (HOSPITAL_COMMUNITY)
Admission: EM | Admit: 2023-06-10 | Discharge: 2023-06-10 | Disposition: A | Discharge status: Home / Self Care | Payer: Medicare Other | Source: Home / Self Care

## 2023-06-10 ENCOUNTER — Other Ambulatory Visit: Payer: Self-pay

## 2023-06-10 DIAGNOSIS — Y712 Prosthetic and other implants, materials and accessory cardiovascular devices associated with adverse incidents: Secondary | ICD-10-CM | POA: Insufficient documentation

## 2023-06-10 DIAGNOSIS — T82838A Hemorrhage of vascular prosthetic devices, implants and grafts, initial encounter: Secondary | ICD-10-CM | POA: Diagnosis not present

## 2023-06-10 NOTE — Discharge Instructions (Signed)
Follow-up with vascular surgery.  Return immediately if develop fevers, chills, rebleeding, lightheadedness, chest pain or shortness of breath or any new or worsening symptoms.

## 2023-06-10 NOTE — ED Triage Notes (Addendum)
Pt came in via POV d/t the site on her Lt arm where she had a thrombosis ligation done by Dr. Randie Heinz on July 26th started bleeding. She has since cleaned up the skin around it & put a gauze dressing on with tape to hold it tight & reports she did not hold any pressure on it & it stopped bleeding on its own. Pt denies feeling lightheaded or any pain currently while in triage. Hx of low hgb.

## 2023-06-10 NOTE — ED Notes (Signed)
The pt had surgery on an old lt arm fistula at the last of the month  she still has staples in the wound  since this am the wound has been oozing blood  sl not a flow  she was told to come in  to have it checked  al ert no distress

## 2023-06-10 NOTE — ED Provider Notes (Signed)
[place] EMERGENCY DEPARTMENT AT [facility] Provider Note   CSN: [id number] Arrival date & time: 06/10/23  1407     History  Chief Complaint  Patient presents with   Post Surgical Bleeding    Christina Vasquez is a 67 y.o. female.  67 year old female present emergency department for bleeding from her AV fistula.  Recently had ligation with vascular surgery a week ago.  She noted that she had a pinpoint area of bleeding this morning.  Put some pressure on it and bleeding seemed to stop.  However it did not continue.        Home Medications Prior to Admission medications   Medication Sig Start Date End Date Taking? Authorizing Provider  acetaminophen (TYLENOL) 650 MG CR tablet Take 1,300 mg by mouth every 8 (eight) hours as needed for pain.    [provider]  cinacalcet (SENSIPAR) 60 MG tablet Take 60 mg by mouth every Monday, Wednesday, and Friday.    [provider]  metoprolol succinate (TOPROL-XL) 100 MG 24 hr tablet Take 50 mg by mouth at bedtime. 03/10/22   [provider]  oxyCODONE-acetaminophen (PERCOCET) 5-325 MG tablet Take 1 tablet by mouth every 6 (six) hours as needed for severe pain. 06/01/23 05/31/24  Baglia, Corrina, PA-C  venlafaxine XR (EFFEXOR-XR) 37.5 MG 24 hr capsule Take 37.5 mg by mouth daily with breakfast.    [provider]      Allergies    Meloxicam, Augmentin [amoxicillin-pot clavulanate], and Tetracyclines & related    Review of Systems   Review of Systems  Physical Exam Updated Vital Signs BP (!) 162/95 (BP Location: Right Arm)   Pulse 95   Temp 98.4 F (36.9 C) (Oral)   Resp 15   Ht 5\' 2"  (1.575 m)   Wt 98.4 kg   SpO2 94%   BMI 39.69 kg/m  Physical Exam Vitals and nursing note reviewed.  Constitutional:      General: She is not in acute distress.    Appearance: She is not toxic-appearing.  HENT:     Head: Normocephalic.     Nose: Nose normal.     Mouth/Throat:     Mouth: Mucous  membranes are moist.  Eyes:     Conjunctiva/sclera: Conjunctivae normal.  Cardiovascular:     Rate and Rhythm: Normal rate.  Pulmonary:     Effort: Pulmonary effort is normal.  Abdominal:     General: Abdomen is flat.  Skin:    Comments: AV fistula to left upper extremity.  Small pinpoint area of venous type oozing.  She has 2+ radial pulses.  Normal sensation neurovascular intact in extremity.  No overlying cellulitis or erythema.  No purulent drainage.  Neurological:     General: No focal deficit present.     Mental Status: She is alert.  Psychiatric:        Mood and Affect: Mood normal.        Behavior: Behavior normal.     ED Results / Procedures / Treatments   Labs (all labs ordered are listed, but only abnormal results are displayed) Labs Reviewed - No data to display  EKG None  Radiology No results found.  Procedures Procedures    Medications Ordered in ED Medications - No data to display  ED Course/ Medical Decision Making/ A&P  Medical Decision Making 67 year old female present emergency department with bleeding from her AV fistula site.  Vital signs reassuring.  Small venous type oozing.  Bleeding controlled by myself with direct pressure.  Then used Dermabond overlying the pinpoint area.  No further bleeding.  Wrapped with gauze and Coban.  Patient remained neurovascularly intact.  She is having no other systemic symptoms, no lightheadedness, chest pain or shortness of breath.  She has follow-up with vascular surgery in a month.,  She stated she will call tomorrow for possible evaluation.  Given return precautions if bleeding returns.  Amount and/or Complexity of Data Reviewed External Data Reviewed: notes.    Details: Uneventful ligation on 7/26. Labs:     Details: Normal vitals, minor ooze.  Low suspicion for acute blood loss anemia.  Labs unlikely to change management or disposition.  Risk Decision regarding  hospitalization. Risk Details: Bleeding controlled.  Normal vitals.  Patient unlikely to benefit from hospitalization at this point.  She will follow-up with outpatient.         Final Clinical Impression(s) / ED Diagnoses Final diagnoses:  None    Rx / DC Orders ED Discharge Orders     None         Coral Spikes, DO 06/10/23 1539

## 2023-06-11 ENCOUNTER — Telehealth: Payer: Self-pay

## 2023-06-11 ENCOUNTER — Ambulatory Visit (INDEPENDENT_AMBULATORY_CARE_PROVIDER_SITE_OTHER): Payer: Medicare Other | Admitting: Physician Assistant

## 2023-06-11 VITALS — BP 187/96 | HR 96 | Temp 98.4°F | Resp 16 | Ht 62.0 in | Wt 215.4 lb

## 2023-06-11 DIAGNOSIS — N186 End stage renal disease: Secondary | ICD-10-CM

## 2023-06-11 DIAGNOSIS — R58 Hemorrhage, not elsewhere classified: Secondary | ICD-10-CM

## 2023-06-11 NOTE — Telephone Encounter (Signed)
Caller: Patient  Concern: still having continuous bleeding/oozing from incision site despite recent ED visit, dermabond application, and dressing.  Pt has been bending and moving arm to the point that the dressing has loosened and is ineffective.  Location: left arm  Description:  started yesterday on her way to church  Treatments:  manual pressure is effective, but pt has no one to help her re-wrap her arm  Procedure:  AVF ligation, hematoma evacuatioin  Consulted: Corrie, PA  Resolution: Appointment scheduled as add-on today for arm wrapping  Next Appt: Appointment scheduled for 1430 with PA

## 2023-06-11 NOTE — Progress Notes (Signed)
    Postoperative Access Visit   History of Present Illness   Christina Vasquez is a 67 y.o. year old female who presents for postoperative follow-up for: ligation of left arm AV fistula with thrombectomy of 2 large pseudoaneurysms on 06/01/23 by Dr. Randie Heinz. The patient's wounds  healing well, staples intact.  The patient notes no steal symptoms.  However, yesterday she had spontaneous bleeding from the distal area of prior pseudoaneurysm. She was very alarmed so she went to ER. In ER they felt this was some venous bleeding so they held compression with hemostasis achieved and placed Dermabond and a compression dressing on. Since the ER the patient says just with regular movement the compression dressing rolled down her arm and was very uncomfortable at her elbow crease so she had to remove it and upon removing it she had recurrent bloody oozing. She called our office this morning to see what she needed to do and was asked to present for evaluation. She is currently dialyzing via a right internal jugular TDC.     Physical Examination   Vitals:   06/11/23 1419  BP: (!) 187/96  Pulse: 96  Resp: 16  Temp: 98.4 F (36.9 C)  TempSrc: Temporal  SpO2: 95%  Weight: 215 lb 6.4 oz (97.7 kg)  Height: 5\' 2"  (1.575 m)   Body mass index is 39.4 kg/m.  left arm Incisions healing well, staples intact. In the distal aneurysmal area there is a small pinpoint opening where I think he skin was very thin from the pseudoaneurysm and she has had some spontaneous bleeding from this(as shown below). I was unable to get any bleeding on compression. I cleaned the area with betadine placed 2x2's and a 2" ACE wrap to compress the arm. 2+ radial pulse, hand grip is 5/5, sensation in digits is  intact     Medical Decision Making   Christina Vasquez is a 67 y.o. year old female who presents s/p ligation of left arm AV fistula with thrombectomy of 2 large pseudoaneurysms on 06/01/23 by Dr. Randie Heinz. The patient's wounds healing  well, staples intact. The patient notes no steal symptoms. She has had some spontaneous bleeding from area of thinned skin. There should be no flow through this old fistula as it is ligated so suspect this is just some old blood or a little venous bleeding that should resolve. I applied a compression dressing and advised her to keep this on for 48 hours. Recommend she be cautious with the arm after the 48 hours and call if she has any further issues. Otherwise she will keep her post op appointment for staple removal on 07/04/23   Graceann Congress, PA-C [facility] [place] Office: [phone]  Clinic MD: Myra Gianotti

## 2023-06-13 ENCOUNTER — Inpatient Hospital Stay: Payer: Medicare Other

## 2023-06-13 ENCOUNTER — Other Ambulatory Visit: Payer: Self-pay

## 2023-06-13 ENCOUNTER — Inpatient Hospital Stay: Payer: Medicare Other | Attending: Hematology and Oncology | Admitting: Hematology and Oncology

## 2023-06-13 VITALS — BP 164/87 | HR 94 | Temp 98.3°F | Resp 17 | Wt 215.0 lb

## 2023-06-13 DIAGNOSIS — N185 Chronic kidney disease, stage 5: Secondary | ICD-10-CM | POA: Diagnosis present

## 2023-06-13 DIAGNOSIS — I120 Hypertensive chronic kidney disease with stage 5 chronic kidney disease or end stage renal disease: Secondary | ICD-10-CM | POA: Diagnosis present

## 2023-06-13 DIAGNOSIS — Z803 Family history of malignant neoplasm of breast: Secondary | ICD-10-CM | POA: Diagnosis not present

## 2023-06-13 DIAGNOSIS — D631 Anemia in chronic kidney disease: Secondary | ICD-10-CM | POA: Diagnosis present

## 2023-06-13 DIAGNOSIS — N186 End stage renal disease: Secondary | ICD-10-CM | POA: Diagnosis not present

## 2023-06-13 DIAGNOSIS — D500 Iron deficiency anemia secondary to blood loss (chronic): Secondary | ICD-10-CM

## 2023-06-13 DIAGNOSIS — D649 Anemia, unspecified: Secondary | ICD-10-CM

## 2023-06-13 DIAGNOSIS — Z992 Dependence on renal dialysis: Secondary | ICD-10-CM | POA: Diagnosis not present

## 2023-06-13 LAB — CBC WITH DIFFERENTIAL (CANCER CENTER ONLY)
Abs Immature Granulocytes: 0.06 10*3/uL (ref 0.00–0.07)
Basophils Absolute: 0 10*3/uL (ref 0.0–0.1)
Basophils Relative: 0 %
Eosinophils Absolute: 0.2 10*3/uL (ref 0.0–0.5)
Eosinophils Relative: 2 %
HCT: 26.5 % — ABNORMAL LOW (ref 36.0–46.0)
Hemoglobin: 8.4 g/dL — ABNORMAL LOW (ref 12.0–15.0)
Immature Granulocytes: 1 %
Lymphocytes Relative: 19 %
Lymphs Abs: 1.4 10*3/uL (ref 0.7–4.0)
MCH: 27.2 pg (ref 26.0–34.0)
MCHC: 31.7 g/dL (ref 30.0–36.0)
MCV: 85.8 fL (ref 80.0–100.0)
Monocytes Absolute: 0.4 10*3/uL (ref 0.1–1.0)
Monocytes Relative: 5 %
Neutro Abs: 5.4 10*3/uL (ref 1.7–7.7)
Neutrophils Relative %: 73 %
Platelet Count: 190 10*3/uL (ref 150–400)
RBC: 3.09 MIL/uL — ABNORMAL LOW (ref 3.87–5.11)
RDW: 19 % — ABNORMAL HIGH (ref 11.5–15.5)
WBC Count: 7.4 10*3/uL (ref 4.0–10.5)
nRBC: 0.4 % — ABNORMAL HIGH (ref 0.0–0.2)

## 2023-06-13 LAB — CMP (CANCER CENTER ONLY)
ALT: 10 U/L (ref 0–44)
AST: 16 U/L (ref 15–41)
Albumin: 4 g/dL (ref 3.5–5.0)
Alkaline Phosphatase: 79 U/L (ref 38–126)
Anion gap: 8 (ref 5–15)
BUN: 11 mg/dL (ref 8–23)
CO2: 35 mmol/L — ABNORMAL HIGH (ref 22–32)
Calcium: 8.2 mg/dL — ABNORMAL LOW (ref 8.9–10.3)
Chloride: 98 mmol/L (ref 98–111)
Creatinine: 3.21 mg/dL — ABNORMAL HIGH (ref 0.44–1.00)
GFR, Estimated: 15 mL/min — ABNORMAL LOW (ref >60)
Glucose, Bld: 102 mg/dL — ABNORMAL HIGH (ref 70–99)
Potassium: 3.2 mmol/L — ABNORMAL LOW (ref 3.5–5.1)
Sodium: 141 mmol/L (ref 135–145)
Total Bilirubin: 0.4 mg/dL (ref 0.3–1.2)
Total Protein: 6.8 g/dL (ref 6.5–8.1)

## 2023-06-13 LAB — RETIC PANEL
Immature Retic Fract: 41.9 % — ABNORMAL HIGH (ref 2.3–15.9)
RBC.: 3.13 MIL/uL — ABNORMAL LOW (ref 3.87–5.11)
Retic Count, Absolute: 226.6 10*3/uL — ABNORMAL HIGH (ref 19.0–186.0)
Retic Ct Pct: 7.2 % — ABNORMAL HIGH (ref 0.4–3.1)
Reticulocyte Hemoglobin: 21.8 pg — ABNORMAL LOW (ref >27.9)

## 2023-06-13 LAB — FOLATE: Folate: 6 ng/mL (ref >5.9)

## 2023-06-13 LAB — IRON AND IRON BINDING CAPACITY (CC-WL,HP ONLY)
Iron: 83 ug/dL (ref 28–170)
Saturation Ratios: 44 % — ABNORMAL HIGH (ref 10.4–31.8)
TIBC: 190 ug/dL — ABNORMAL LOW (ref 250–450)
UIBC: 107 ug/dL — ABNORMAL LOW (ref 148–442)

## 2023-06-13 LAB — LACTATE DEHYDROGENASE: LDH: 237 U/L — ABNORMAL HIGH (ref 98–192)

## 2023-06-13 LAB — FERRITIN: Ferritin: 2271 ng/mL — ABNORMAL HIGH (ref 11–307)

## 2023-06-13 LAB — VITAMIN B12: Vitamin B-12: 682 pg/mL (ref 180–914)

## 2023-06-13 NOTE — Progress Notes (Signed)
[facility] [phone]   [phone]  INITIAL CONSULT NOTE  Patient Care Team: Tamala Ser as PCP - General (Physician Assistant) Center, [facility]  Hematological/Oncological History # Normocytic Anemia  04/06/2023: WBC 7.2, Hgb 11.6, MCV 89.6, Plt 178 05/24/2023: WBC 9.0, Hgb 7.1, MCV 84.8, Plt 304 06/01/2023: Hgb 9.2 06/13/2023: establish care with Dr. Leonides Schanz   CHIEF COMPLAINTS/PURPOSE OF CONSULTATION:  "Normocytic Anemia  "  HISTORY OF PRESENTING ILLNESS:  Christina Vasquez 67 y.o. female with medical history significant for ESRD, OSA, HTN, and hyperparathyroidism who presents for evaluation of anemia.   On review of the previous records Christina Vasquez had labs collected on 04/06/2023 which showed white blood cell count 7.2, hemoglobin 9.6, MCV 89.6, and platelets of 178.  Most recently on 05/24/2023 patient had a hemoglobin of 7.1.  Due to concern for this patient's anemia she was referred to hematology for further evaluation and management.  On exam today Christina Vasquez reports that she is currently on dialysis on Mondays Wednesdays and Fridays.  She reports that she has recently been told her hemoglobin levels were low.  She reports this and offer a stool sample which did not show any evidence of blood.  She reports that she has had some issues before with low iron but has received iron during her dialysis sessions.  She reports she is not currently taking any iron pills or iron supplements.  She is connected with Dr. Elnoria Howard and gastroenterology and will meeting with him on 06/26/2023.  On further discussion the patient reports that her mother is healthy but did recently have COVID-pneumonia.  Her father had prostate cancer and she had a sister with breast cancer.  She also had 2 first cousins with breast cancer.  She reports that she has another sister on dialysis.  She notes that she is a never smoker.  She reports that she has not had  alcohol in years.  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  She notes that she is not having any issues with lightheadedness, dizziness, shortness of breath.  She does endorse having poor energy.  She is not having any overt signs of bleeding and reports that she is having some dark bowel movements.  Otherwise she denies any fevers, chills, sweats, nausea, vomiting or diarrhea.  A full 10 point ROS is otherwise negative. - MEDICAL HISTORY:  Past Medical History:  Diagnosis Date   Anemia    Anxiety    Arthritis    Hyperparathyroidism (HCC)    Hypertension    PONV (postoperative nausea and vomiting)    Happened one time. Has had sx since then with no N/V   Renal disorder 12/19/2018   Stage 4 ESRD. HD-MWF   Sleep apnea     SURGICAL HISTORY: Past Surgical History:  Procedure Laterality Date   A/V FISTULAGRAM  2019   ABDOMINAL HYSTERECTOMY  2005   BREAST EXCISIONAL BIOPSY Left 2017   scar not visible   CHOLECYSTECTOMY  2002   COLONOSCOPY  2010   COLONOSCOPY WITH PROPOFOL N/A 01/02/2023   Procedure: COLONOSCOPY WITH PROPOFOL;  Surgeon: Jeani Hawking, MD;  Location: [facility] END[facility]COPY;  Service: Gastroenterology;  Laterality: N/A;   IR AV DIALY SHUNT INTRO NEEDLE/INTRAC INITIAL W/PTA/STENT/IMG LT  2021   Stent placed in Left Arm Fistula. Placed in [place]   LIGATION OF ARTERIOVENOUS  FISTULA Left 06/01/2023   Procedure: LIGATION OF LEFT ARM ARTERIOVENOUS  FISTULA WITH HEMATOMA EVACUATION;  Surgeon: Lemar Livings  Cristal Deer, MD;  Location: [facility] OR;  Service: Vascular;  Laterality: Left;   POLYPECTOMY  01/02/2023   Procedure: POLYPECTOMY;  Surgeon: Jeani Hawking, MD;  Location: [facility];  Service: Gastroenterology;;   REVERSE SHOULDER ARTHROPLASTY Right 06/01/2022   Procedure: REVERSE SHOULDER ARTHROPLASTY;  Surgeon: Bjorn Pippin, MD;  Location: [facility] OR;  Service: Orthopedics;  Laterality: Right;   REVERSE SHOULDER ARTHROPLASTY Left 09/21/2022   Procedure: REVERSE SHOULDER  ARTHROPLASTY;  Surgeon: Bjorn Pippin, MD;  Location: [facility] OR;  Service: Orthopedics;  Laterality: Left;   TOTAL KNEE ARTHROPLASTY Right 2008   TUBAL LIGATION      SOCIAL HISTORY: Social History   Socioeconomic History   Marital status: Widowed    Spouse name: Not on file   Number of children: 2   Years of education: Not on file   Highest education level: Not on file  Occupational History   Not on file  Tobacco Use   Smoking status: Never   Smokeless tobacco: Never  Vaping Use   Vaping status: Never Used  Substance and Sexual Activity   Alcohol use: Not Currently   Drug use: Never   Sexual activity: Not Currently  Other Topics Concern   Not on file  Social History Narrative   Not on file   Social Determinants of Health   Financial Resource Strain: Low Risk  (07/04/2018)   Received from [facility], [facility]   Overall Financial Resource Strain (CARDIA)    Difficulty of Paying Living Expenses: Not hard at all  Food Insecurity: No Food Insecurity (07/04/2018)   Received from [facility], [facility]   Hunger Vital Sign    Worried About Running Out of Food in the Last Year: Never true    Ran Out of Food in the Last Year: Never true  Transportation Needs: No Transportation Needs (07/04/2018)   Received from [facility], [facility]   PRAPARE - Transportation    Lack of Transportation (Medical): No    Lack of Transportation (Non-Medical): No  Physical Activity: Unknown (07/04/2018)   Received from [facility], [facility]   Exercise Vital Sign    Days of Exercise per Week: 0 days    Minutes of Exercise per Session: Not on file  Stress: Stress Concern Present (07/04/2018)   Received from [facility], [facility]   [employer] of Occupational Health - Occupational Stress Questionnaire     Feeling of Stress : Very much  Social Connections: Moderately Isolated (07/04/2018)   Received from [facility], [facility]   Social Connection and Isolation Panel [NHANES]    Frequency of Communication with Friends and Family: Twice a week    Frequency of Social Gatherings with Friends and Family: Never    Attends Religious Services: More than 4 times per year    Active Member of [employer] or Organizations: No    Attends Banker Meetings: Never    Marital Status: Widowed  Intimate Partner Violence: Not At Risk (01/27/2020)   Received from [facility], [facility]   Humiliation, Afraid, Rape, and Kick questionnaire    Fear of Current or Ex-Partner: No    Emotionally Abused: No    Physically Abused: No    Sexually Abused: No    FAMILY HISTORY: Family History  Problem Relation Age of Onset   Breast cancer Sister  Breast cancer Cousin        maternal first cousin    ALLERGIES:  is allergic to meloxicam, augmentin [amoxicillin-pot clavulanate], and tetracyclines & related.  MEDICATIONS:  Current Outpatient Medications  Medication Sig Dispense Refill   acetaminophen (TYLENOL) 650 MG CR tablet Take 1,300 mg by mouth every 8 (eight) hours as needed for pain.     amLODipine (NORVASC) 10 MG tablet Take 10 mg by mouth daily.     cinacalcet (SENSIPAR) 60 MG tablet Take 60 mg by mouth every Monday, Wednesday, and Friday.     metoprolol succinate (TOPROL-XL) 100 MG 24 hr tablet Take 50 mg by mouth at bedtime.     venlafaxine XR (EFFEXOR-XR) 37.5 MG 24 hr capsule Take 37.5 mg by mouth daily with breakfast.     Current Facility-Administered Medications  Medication Dose Route Frequency Provider Last Rate Last Admin   diclofenac Sodium (VOLTAREN) 1 % topical gel 4 g  4 g Topical QID Huel Cote, MD        REVIEW OF SYSTEMS:   Constitutional: ( - ) fevers, ( - )  chills , ( - ) night sweats Eyes: ( - ) blurriness of vision, ( - )  double vision, ( - ) watery eyes Ears, nose, mouth, throat, and face: ( - ) mucositis, ( - ) sore throat Respiratory: ( - ) cough, ( - ) dyspnea, ( - ) wheezes Cardiovascular: ( - ) palpitation, ( - ) chest discomfort, ( - ) lower extremity swelling Gastrointestinal:  ( - ) nausea, ( - ) heartburn, ( - ) change in bowel habits Skin: ( - ) abnormal skin rashes Lymphatics: ( - ) new lymphadenopathy, ( - ) easy bruising Neurological: ( - ) numbness, ( - ) tingling, ( - ) new weaknesses Behavioral/Psych: ( - ) mood change, ( - ) new changes  All other systems were reviewed with the patient and are negative.  PHYSICAL EXAMINATION:  Vitals:   06/13/23 1303  BP: (!) 164/87  Pulse: 94  Resp: 17  Temp: 98.3 F (36.8 C)  SpO2: 100%   Filed Weights   06/13/23 1303  Weight: 215 lb (97.5 kg)    GENERAL: well appearing delayed African-American female in NAD  SKIN: skin color, texture, turgor are normal, no rashes or significant lesions EYES: conjunctiva are pink and non-injected, sclera clear LUNGS: clear to auscultation and percussion with normal breathing effort HEART: regular rate & rhythm and no murmurs and no lower extremity edema Musculoskeletal: no cyanosis of digits and no clubbing  PSYCH: alert & oriented x 3, fluent speech NEURO: no focal motor/sensory deficits  LABORATORY DATA:  I have reviewed the data as listed    Latest Ref Rng & Units 06/13/2023    1:41 PM 06/01/2023   12:24 PM 05/24/2023    9:09 AM  CBC  WBC 4.0 - 10.5 K/uL 7.4   9.0   Hemoglobin 12.0 - 15.0 g/dL 8.4  9.2  7.1   Hematocrit 36.0 - 46.0 % 26.5  27.0  22.8   Platelets 150 - 400 K/uL 190   304        Latest Ref Rng & Units 06/13/2023    1:41 PM 06/01/2023   12:24 PM 05/24/2023    9:09 AM  CMP  Glucose 70 - 99 mg/dL 161  98  88   BUN 8 - 23 mg/dL 11  12  20    Creatinine 0.44 - 1.00 mg/dL 0.96  0.45  4.09  Sodium 135 - 145 mmol/L 141  137  136   Potassium 3.5 - 5.1 mmol/L 3.2  3.8  3.8   Chloride 98  - 111 mmol/L 98  97  96   CO2 22 - 32 mmol/L 35   29   Calcium 8.9 - 10.3 mg/dL 8.2   9.4   Total Protein 6.5 - 8.1 g/dL 6.8   7.2   Total Bilirubin 0.3 - 1.2 mg/dL 0.4   0.5   Alkaline Phos 38 - 126 U/L 79   56   AST 15 - 41 U/L 16   17   ALT 0 - 44 U/L 10   14      ASSESSMENT & PLAN Christina Vasquez 67 y.o. female with medical history significant for ESRD, OSA, HTN, and hyperparathyroidism who presents for evaluation of anemia.   After review of the labs, review of the records, and discussion with the patient the patients findings are most consistent with anemia in setting of ESRD.   # Normocytic Anemia -- today will order full nutritional panel to include iron panel, ferritin, Vitamin b12, and folate -- additionally will r/o MM with SPEP and SFLC -- will order LDH and reticulocyte panel -- will order baseline erythropoietin level  -- if no clear etiology can be found would consider ESRD to be the cause. Would recommend initiating EPO injections (can be done through nephrology) --RTC pending results of the above studies.   Orders Placed This Encounter  Procedures   CBC with Differential (Cancer Center Only)    Standing Status:   Future    Number of Occurrences:   1    Standing Expiration Date:   06/12/2024   CMP (Cancer Center only)    Standing Status:   Future    Number of Occurrences:   1    Standing Expiration Date:   06/12/2024   Ferritin    Standing Status:   Future    Number of Occurrences:   1    Standing Expiration Date:   06/12/2024   Iron and Iron Binding Capacity ([facility] only)    Standing Status:   Future    Number of Occurrences:   1    Standing Expiration Date:   06/12/2024   Lactate dehydrogenase (LDH)    Standing Status:   Future    Number of Occurrences:   1    Standing Expiration Date:   06/12/2024   Retic Panel    Standing Status:   Future    Number of Occurrences:   1    Standing Expiration Date:   06/12/2024   Vitamin B12    Standing Status:   Future     Number of Occurrences:   1    Standing Expiration Date:   06/12/2024   Folate, Serum    Standing Status:   Future    Number of Occurrences:   1    Standing Expiration Date:   06/12/2024   Multiple Myeloma Panel (SPEP&IFE w/QIG)    Standing Status:   Future    Number of Occurrences:   1    Standing Expiration Date:   06/12/2024   Kappa/lambda light chains    Standing Status:   Future    Number of Occurrences:   1    Standing Expiration Date:   06/12/2024    All questions were answered. The patient knows to call the clinic with any problems, questions or concerns.  A total of more than 60 minutes  were spent on this encounter with face-to-face time and non-face-to-face time, including preparing to see the patient, ordering tests and/or medications, counseling the patient and coordination of care as outlined above.   Ulysees Barns, MD Department of Hematology/Oncology [facility] Phone: [phone] Pager: [phone] Email: [email or web address]  06/15/2023 5:13 PM

## 2023-06-14 ENCOUNTER — Encounter: Payer: Self-pay | Admitting: Vascular Surgery

## 2023-06-15 ENCOUNTER — Other Ambulatory Visit: Payer: Self-pay | Admitting: Hematology and Oncology

## 2023-06-19 ENCOUNTER — Telehealth: Payer: Self-pay | Admitting: Hematology and Oncology

## 2023-07-04 ENCOUNTER — Ambulatory Visit (INDEPENDENT_AMBULATORY_CARE_PROVIDER_SITE_OTHER): Payer: Medicare Other | Admitting: Physician Assistant

## 2023-07-04 VITALS — BP 158/105 | HR 81 | Temp 97.9°F | Wt 214.0 lb

## 2023-07-04 DIAGNOSIS — N186 End stage renal disease: Secondary | ICD-10-CM

## 2023-07-04 NOTE — H&P (View-Only) (Signed)
POST OPERATIVE OFFICE NOTE    CC:  F/u for surgery  HPI:    67 year old female on dialysis via dialysis catheter previously was on dialysis via left upper arm AV fistula. She had an episode of superficial thrombophlebitis and now has mostly thrombosed left upper arm fistula and has exquisite pain.    She was taken to the OR by Dr. Randie Heinz on 06/01/23 for  Ligation left arm AV fistula with thrombectomy of 2 large pseudoaneurysms.  She is here today for incision checks and to discuss new access.  She is on HD TTS via right TDC.    Allergies  Allergen Reactions   Meloxicam Hives and Rash   Augmentin [Amoxicillin-Pot Clavulanate] Nausea And Vomiting   Tetracyclines & Related Hives    Current Outpatient Medications  Medication Sig Dispense Refill   acetaminophen (TYLENOL) 650 MG CR tablet Take 1,300 mg by mouth every 8 (eight) hours as needed for pain.     amLODipine (NORVASC) 10 MG tablet Take 10 mg by mouth daily.     cinacalcet (SENSIPAR) 60 MG tablet Take 60 mg by mouth every Monday, Wednesday, and Friday.     metoprolol succinate (TOPROL-XL) 100 MG 24 hr tablet Take 50 mg by mouth at bedtime.     venlafaxine XR (EFFEXOR-XR) 37.5 MG 24 hr capsule Take 37.5 mg by mouth daily with breakfast.     Current Facility-Administered Medications  Medication Dose Route Frequency Provider Last Rate Last Admin   diclofenac Sodium (VOLTAREN) 1 % topical gel 4 g  4 g Topical QID Huel Cote, MD         ROS:  See HPI  Physical Exam:    Incision:  well healed and staples were removed today she tolerated this well Extremities:  She has a good palpable radial pulse on the left UE, motor intact.  Multiple well healed scars with palpable thromboses old cephalic.    +-----------------+-------------+----------+---------+  Right Cephalic   Diameter (cm)Depth (cm)Findings   +-----------------+-------------+----------+---------+  Prox upper arm       0.12        1.75               +-----------------+-------------+----------+---------+  Mid upper arm        0.11        1.01              +-----------------+-------------+----------+---------+  Dist upper arm       0.10        1.15              +-----------------+-------------+----------+---------+  Antecubital fossa    0.08        1.00              +-----------------+-------------+----------+---------+  Prox forearm         0.10        0.66              +-----------------+-------------+----------+---------+  Mid forearm          0.13        0.88   branching  +-----------------+-------------+----------+---------+  Dist forearm         0.10        0.51              +-----------------+-------------+----------+---------+   +-----------------+-------------+----------+---------+  Right Basilic    Diameter (cm)Depth (cm)Findings   +-----------------+-------------+----------+---------+  Prox upper arm       0.22        2.49              +-----------------+-------------+----------+---------+  Mid upper arm        0.23        2.49              +-----------------+-------------+----------+---------+  Dist upper arm       0.28        1.67              +-----------------+-------------+----------+---------+  Antecubital fossa    0.29        1.33              +-----------------+-------------+----------+---------+  Prox forearm         0.21        0.12   branching  +-----------------+-------------+----------+---------+  Mid forearm          0.14        0.11              +-----------------+-------------+----------+---------+  Distal forearm       0.13        0.29              +-----------------+-------------+----------+---------+   +-----------------+-------------+----------+--------------------+  Left Cephalic    Diameter (cm)Depth (cm)      Findings        +-----------------+-------------+----------+--------------------+  Prox upper arm                           occluded outflow AVF  +-----------------+-------------+----------+--------------------+  Mid upper arm                           occluded outflow AVF  +-----------------+-------------+----------+--------------------+  Dist upper arm                          occluded outflow AVF  +-----------------+-------------+----------+--------------------+  Antecubital fossa                       occluded outflow AVF  +-----------------+-------------+----------+--------------------+  Prox forearm                             partially occluded   +-----------------+-------------+----------+--------------------+  Mid forearm          0.19        0.65                         +-----------------+-------------+----------+--------------------+  Dist forearm         0.19        0.15                         +-----------------+-------------+----------+--------------------+   +-----------------+-------------+----------+--------------+  Left Basilic     Diameter (cm)Depth (cm)   Findings     +-----------------+-------------+----------+--------------+  Prox upper arm                          not visualized  +-----------------+-------------+----------+--------------+  Mid upper arm        0.49        2.73                   +-----------------+-------------+----------+--------------+  Dist upper arm       0.39        1.78                   +-----------------+-------------+----------+--------------+  Antecubital fossa    0.32        1.11     branching     +-----------------+-------------+----------+--------------+  Prox forearm         0.20        0.33                   +-----------------+-------------+----------+--------------+  Mid forearm          0.19        0.33                   +-----------------+-------------+----------+--------------+  Distal forearm       0.15        0.36                    +-----------------+-------------+----------+--------------+   Summary:  Left: Occluded Left Brachial cephalic AVF in the upper arm.    Assessment/Plan:  This is a 67 y.o. female who is s/p:ligation of left UE BC av fistula due to thrombosis with  history of thrombophlebitis.  She has edema and multiple areas of scar tissue.  We will plan right UE first stage basilic verse AV graft as her nex access.  She still has an acceptable basilic on the left UE for futre access as a remote possibility.   She is not on anticoagulation.  She would like to proceed as soon as possible with Dr. Randie Heinz.     Mosetta Pigeon PA-C [facility] [phone]   Clinic MD:  Randie Heinz

## 2023-07-04 NOTE — Progress Notes (Signed)
POST OPERATIVE OFFICE NOTE    CC:  F/u for surgery  HPI:    67 year old female on dialysis via dialysis catheter previously was on dialysis via left upper arm AV fistula. She had an episode of superficial thrombophlebitis and now has mostly thrombosed left upper arm fistula and has exquisite pain.    She was taken to the OR by Dr. Randie Heinz on 06/01/23 for  Ligation left arm AV fistula with thrombectomy of 2 large pseudoaneurysms.  She is here today for incision checks and to discuss new access.  She is on HD TTS via right TDC.    Allergies  Allergen Reactions   Meloxicam Hives and Rash   Augmentin [Amoxicillin-Pot Clavulanate] Nausea And Vomiting   Tetracyclines & Related Hives    Current Outpatient Medications  Medication Sig Dispense Refill   acetaminophen (TYLENOL) 650 MG CR tablet Take 1,300 mg by mouth every 8 (eight) hours as needed for pain.     amLODipine (NORVASC) 10 MG tablet Take 10 mg by mouth daily.     cinacalcet (SENSIPAR) 60 MG tablet Take 60 mg by mouth every Monday, Wednesday, and Friday.     metoprolol succinate (TOPROL-XL) 100 MG 24 hr tablet Take 50 mg by mouth at bedtime.     venlafaxine XR (EFFEXOR-XR) 37.5 MG 24 hr capsule Take 37.5 mg by mouth daily with breakfast.     Current Facility-Administered Medications  Medication Dose Route Frequency Provider Last Rate Last Admin   diclofenac Sodium (VOLTAREN) 1 % topical gel 4 g  4 g Topical QID Huel Cote, MD         ROS:  See HPI  Physical Exam:    Incision:  well healed and staples were removed today she tolerated this well Extremities:  She has a good palpable radial pulse on the left UE, motor intact.  Multiple well healed scars with palpable thromboses old cephalic.    +-----------------+-------------+----------+---------+  Right Cephalic   Diameter (cm)Depth (cm)Findings   +-----------------+-------------+----------+---------+  Prox upper arm       0.12        1.75               +-----------------+-------------+----------+---------+  Mid upper arm        0.11        1.01              +-----------------+-------------+----------+---------+  Dist upper arm       0.10        1.15              +-----------------+-------------+----------+---------+  Antecubital fossa    0.08        1.00              +-----------------+-------------+----------+---------+  Prox forearm         0.10        0.66              +-----------------+-------------+----------+---------+  Mid forearm          0.13        0.88   branching  +-----------------+-------------+----------+---------+  Dist forearm         0.10        0.51              +-----------------+-------------+----------+---------+   +-----------------+-------------+----------+---------+  Right Basilic    Diameter (cm)Depth (cm)Findings   +-----------------+-------------+----------+---------+  Prox upper arm       0.22        2.49              +-----------------+-------------+----------+---------+  Mid upper arm        0.23        2.49              +-----------------+-------------+----------+---------+  Dist upper arm       0.28        1.67              +-----------------+-------------+----------+---------+  Antecubital fossa    0.29        1.33              +-----------------+-------------+----------+---------+  Prox forearm         0.21        0.12   branching  +-----------------+-------------+----------+---------+  Mid forearm          0.14        0.11              +-----------------+-------------+----------+---------+  Distal forearm       0.13        0.29              +-----------------+-------------+----------+---------+   +-----------------+-------------+----------+--------------------+  Left Cephalic    Diameter (cm)Depth (cm)      Findings        +-----------------+-------------+----------+--------------------+  Prox upper arm                           occluded outflow AVF  +-----------------+-------------+----------+--------------------+  Mid upper arm                           occluded outflow AVF  +-----------------+-------------+----------+--------------------+  Dist upper arm                          occluded outflow AVF  +-----------------+-------------+----------+--------------------+  Antecubital fossa                       occluded outflow AVF  +-----------------+-------------+----------+--------------------+  Prox forearm                             partially occluded   +-----------------+-------------+----------+--------------------+  Mid forearm          0.19        0.65                         +-----------------+-------------+----------+--------------------+  Dist forearm         0.19        0.15                         +-----------------+-------------+----------+--------------------+   +-----------------+-------------+----------+--------------+  Left Basilic     Diameter (cm)Depth (cm)   Findings     +-----------------+-------------+----------+--------------+  Prox upper arm                          not visualized  +-----------------+-------------+----------+--------------+  Mid upper arm        0.49        2.73                   +-----------------+-------------+----------+--------------+  Dist upper arm       0.39        1.78                   +-----------------+-------------+----------+--------------+  Antecubital fossa    0.32        1.11     branching     +-----------------+-------------+----------+--------------+  Prox forearm         0.20        0.33                   +-----------------+-------------+----------+--------------+  Mid forearm          0.19        0.33                   +-----------------+-------------+----------+--------------+  Distal forearm       0.15        0.36                    +-----------------+-------------+----------+--------------+   Summary:  Left: Occluded Left Brachial cephalic AVF in the upper arm.    Assessment/Plan:  This is a 67 y.o. female who is s/p:ligation of left UE BC av fistula due to thrombosis with  history of thrombophlebitis.  She has edema and multiple areas of scar tissue.  We will plan right UE first stage basilic verse AV graft as her nex access.  She still has an acceptable basilic on the left UE for futre access as a remote possibility.   She is not on anticoagulation.  She would like to proceed as soon as possible with Dr. Randie Heinz.     Mosetta Pigeon PA-C [facility] [phone]   Clinic MD:  Randie Heinz

## 2023-07-10 ENCOUNTER — Telehealth: Payer: Self-pay

## 2023-07-10 IMAGING — DX DG FOREARM 2V*L*
2 series · 2 of 2 positions shown · non-contrast
Comparison: None.

CLINICAL DATA: Swelling, fall

EXAM:
LEFT FOREARM - 2 VIEW

[forearm ap]
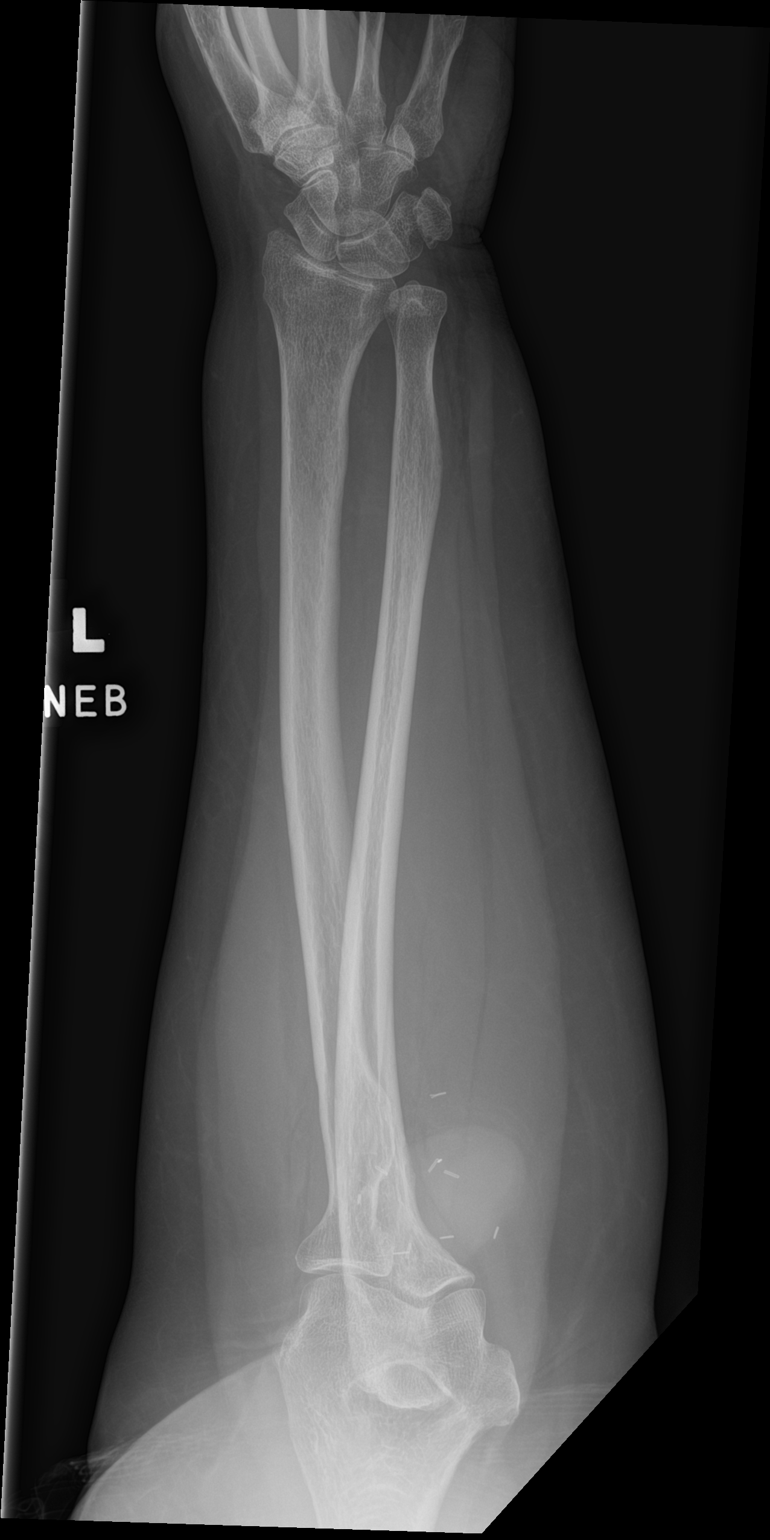

[forearm lat]
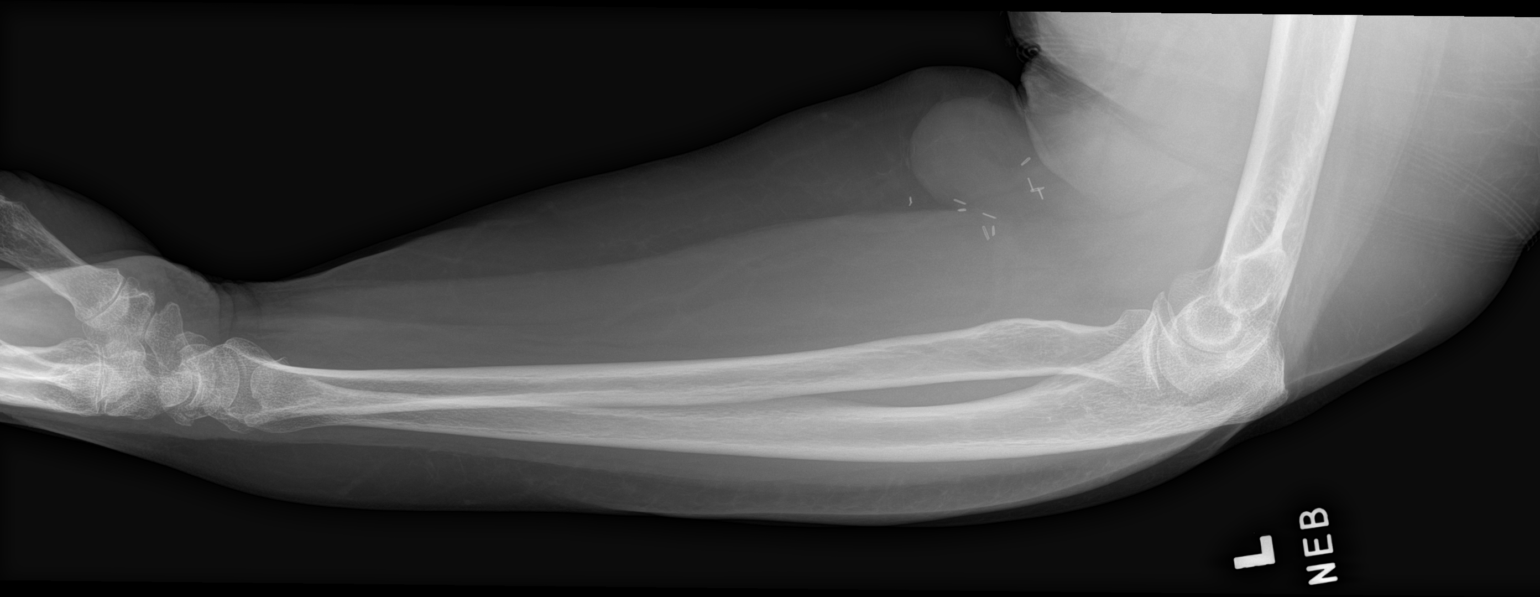

[2 of 2 positions shown; findings below may reference images not displayed]

FINDINGS: No acute bony abnormality. Specifically, no fracture, subluxation,
or dislocation. Soft tissues unremarkable.
IMPRESSION: No acute bony abnormality.

## 2023-07-10 IMAGING — DX DG WRIST COMPLETE 3+V*L*
4 series · 4 of 4 positions shown · non-contrast
Comparison: None.

CLINICAL DATA: Fall, left wrist pain, deformity, swelling

EXAM:
LEFT WRIST - COMPLETE 3+ VIEW

[wrist ap]
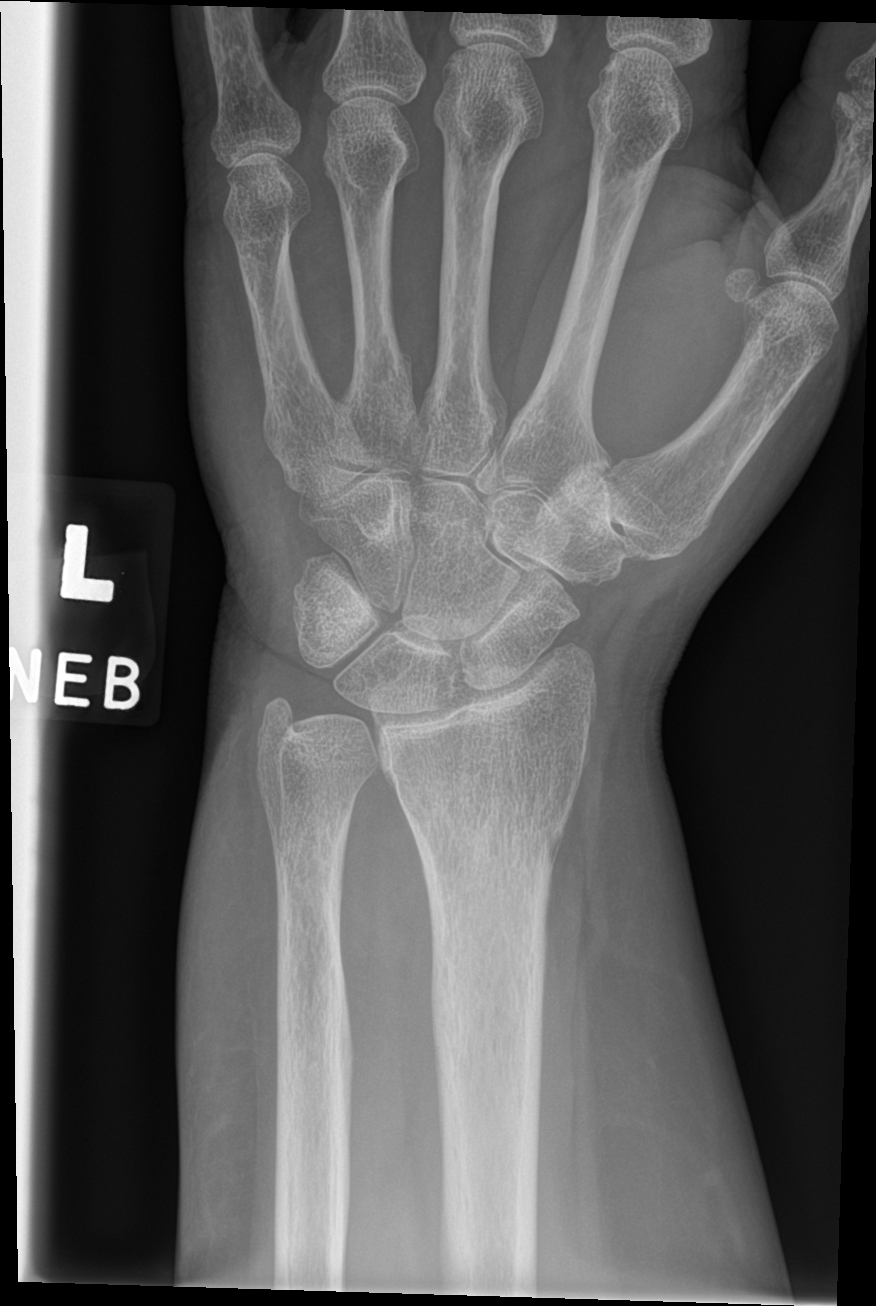

[wrist obl]
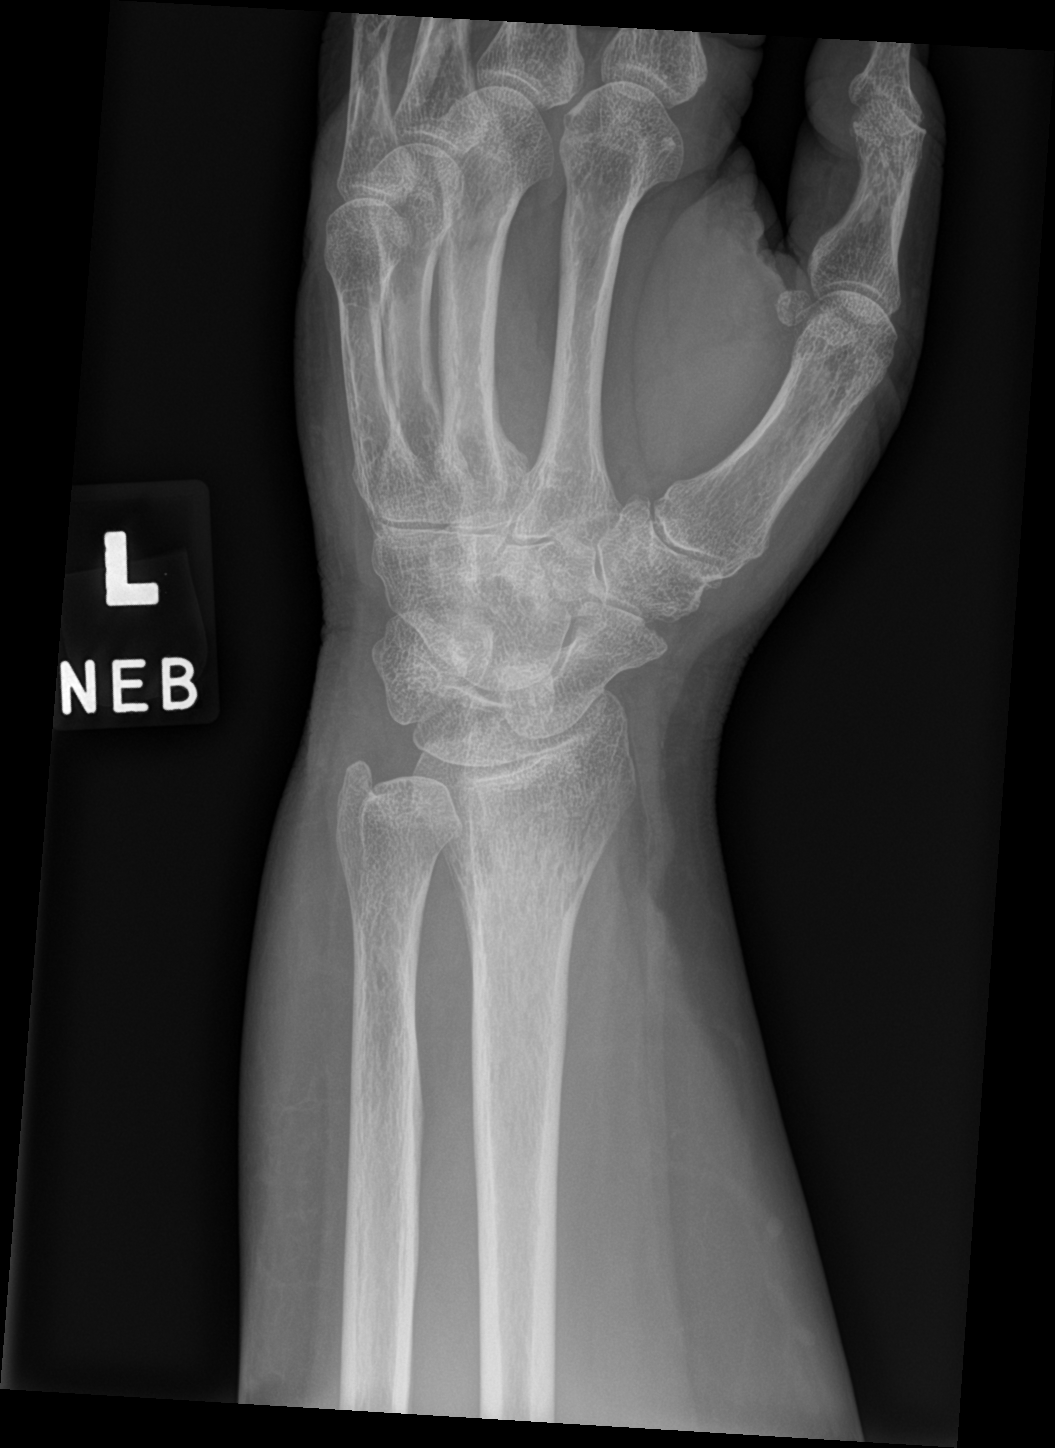

[wrist lat]
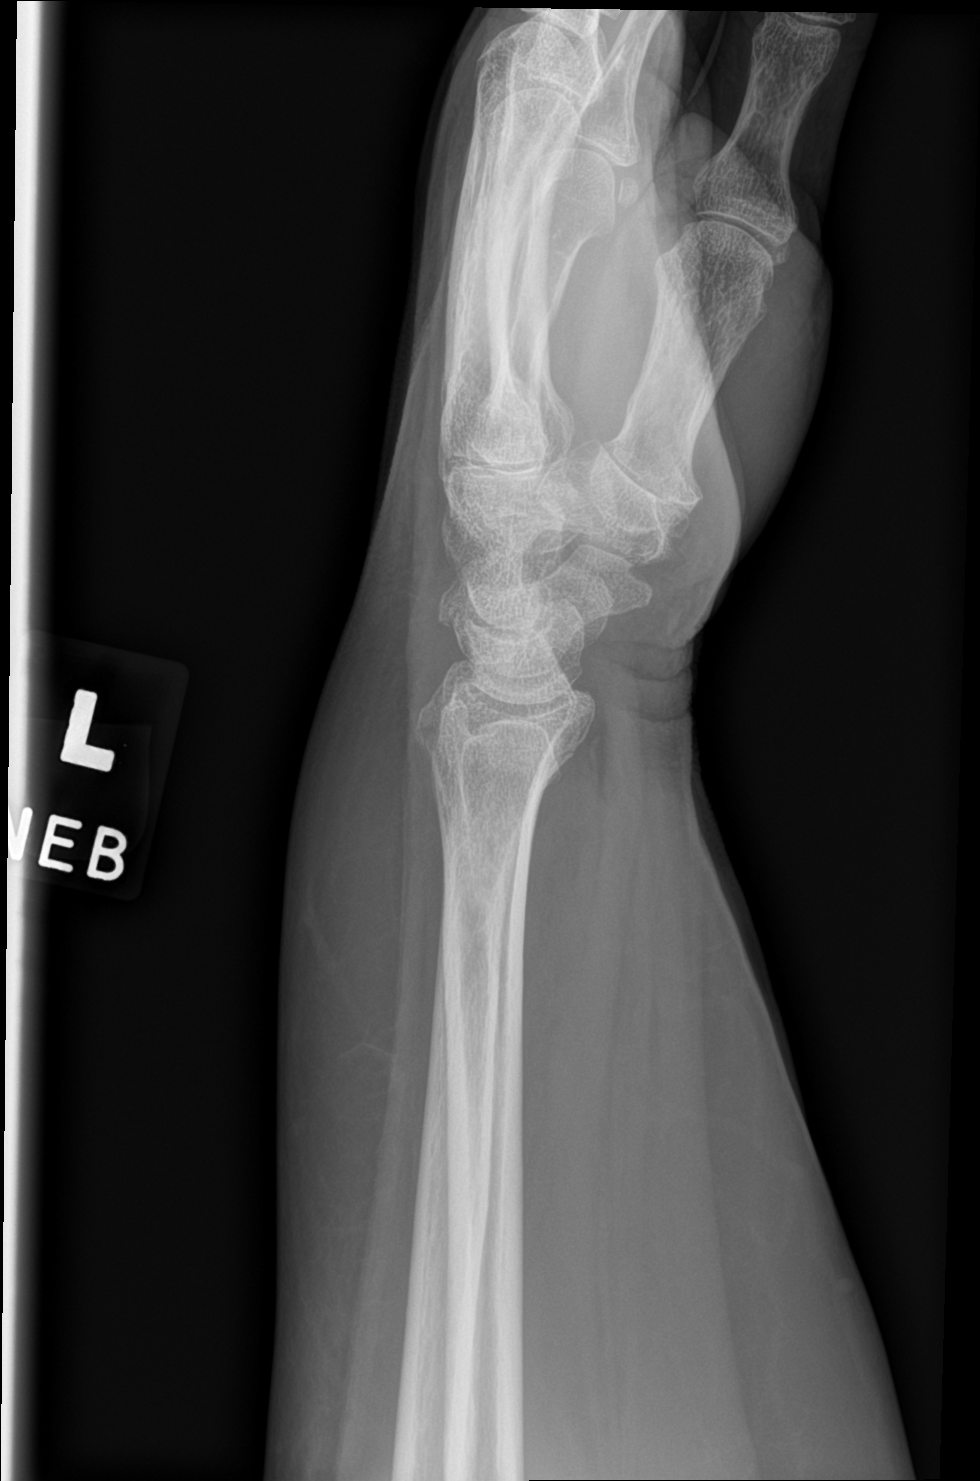

[wrist navicular]
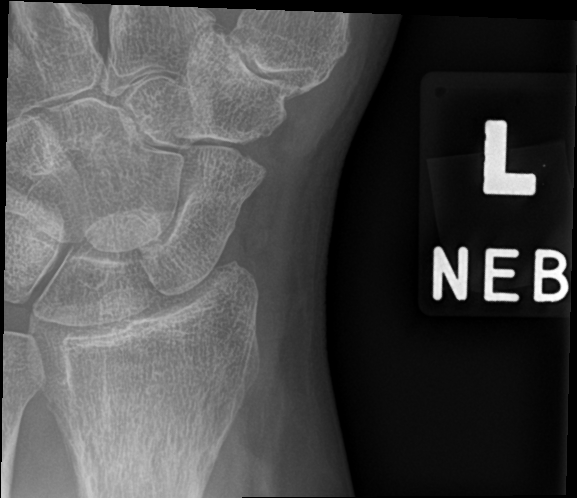

[4 of 4 positions shown; findings below may reference images not displayed]

FINDINGS: Subtle lucency noted in the distal left radius without well-defined
fracture line. This may be related to osteopenia. No subluxation or
dislocation. Soft tissues are intact.
IMPRESSION: Subtle lucency in the distal radius without well-defined fracture
line. Consider immobilization and repeat imaging in 1 week if
symptoms persist.

## 2023-07-10 NOTE — Telephone Encounter (Signed)
Attempted to reach patient to schedule surgery. Left VM for patient to return call.  

## 2023-07-11 ENCOUNTER — Other Ambulatory Visit: Payer: Self-pay

## 2023-07-11 DIAGNOSIS — N186 End stage renal disease: Secondary | ICD-10-CM

## 2023-07-11 NOTE — Telephone Encounter (Signed)
Patient returned call. Scheduled surgery for 9/17. Instructions provided and she voiced understanding. Will also send instructions letter to MyChart and fax to dialysis center as requested.

## 2023-07-12 ENCOUNTER — Inpatient Hospital Stay: Payer: Medicare Other | Attending: Hematology and Oncology

## 2023-07-12 ENCOUNTER — Inpatient Hospital Stay: Payer: Medicare Other | Admitting: Hematology and Oncology

## 2023-07-12 ENCOUNTER — Other Ambulatory Visit: Payer: Self-pay | Admitting: Hematology and Oncology

## 2023-07-12 VITALS — BP 165/100 | HR 79 | Temp 98.2°F | Resp 15 | Wt 216.7 lb

## 2023-07-12 DIAGNOSIS — D649 Anemia, unspecified: Secondary | ICD-10-CM | POA: Diagnosis not present

## 2023-07-12 DIAGNOSIS — Z862 Personal history of diseases of the blood and blood-forming organs and certain disorders involving the immune mechanism: Secondary | ICD-10-CM | POA: Diagnosis present

## 2023-07-12 DIAGNOSIS — D500 Iron deficiency anemia secondary to blood loss (chronic): Secondary | ICD-10-CM | POA: Diagnosis not present

## 2023-07-12 LAB — CBC WITH DIFFERENTIAL (CANCER CENTER ONLY)
Abs Immature Granulocytes: 0 10*3/uL (ref 0.00–0.07)
Basophils Absolute: 0 10*3/uL (ref 0.0–0.1)
Basophils Relative: 0 %
Eosinophils Absolute: 0.1 10*3/uL (ref 0.0–0.5)
Eosinophils Relative: 1 %
HCT: 38.3 % (ref 36.0–46.0)
Hemoglobin: 12.1 g/dL (ref 12.0–15.0)
Immature Granulocytes: 0 %
Lymphocytes Relative: 23 %
Lymphs Abs: 1.1 10*3/uL (ref 0.7–4.0)
MCH: 26 pg (ref 26.0–34.0)
MCHC: 31.6 g/dL (ref 30.0–36.0)
MCV: 82.2 fL (ref 80.0–100.0)
Monocytes Absolute: 0.3 10*3/uL (ref 0.1–1.0)
Monocytes Relative: 7 %
Neutro Abs: 3.3 10*3/uL (ref 1.7–7.7)
Neutrophils Relative %: 69 %
Platelet Count: 154 10*3/uL (ref 150–400)
RBC: 4.66 MIL/uL (ref 3.87–5.11)
RDW: 15.9 % — ABNORMAL HIGH (ref 11.5–15.5)
WBC Count: 4.8 10*3/uL (ref 4.0–10.5)
nRBC: 0 % (ref 0.0–0.2)

## 2023-07-12 LAB — DIRECT ANTIGLOBULIN TEST (NOT AT ARMC)
DAT, IgG: NEGATIVE
DAT, complement: NEGATIVE

## 2023-07-12 LAB — CMP (CANCER CENTER ONLY)
ALT: 14 U/L (ref 0–44)
AST: 15 U/L (ref 15–41)
Albumin: 4.1 g/dL (ref 3.5–5.0)
Alkaline Phosphatase: 74 U/L (ref 38–126)
Anion gap: 13 (ref 5–15)
BUN: 51 mg/dL — ABNORMAL HIGH (ref 8–23)
CO2: 27 mmol/L (ref 22–32)
Calcium: 9.3 mg/dL (ref 8.9–10.3)
Chloride: 100 mmol/L (ref 98–111)
Creatinine: 5.48 mg/dL — ABNORMAL HIGH (ref 0.44–1.00)
GFR, Estimated: 8 mL/min — ABNORMAL LOW (ref >60)
Glucose, Bld: 76 mg/dL (ref 70–99)
Potassium: 4.5 mmol/L (ref 3.5–5.1)
Sodium: 140 mmol/L (ref 135–145)
Total Bilirubin: 0.4 mg/dL (ref 0.3–1.2)
Total Protein: 6.9 g/dL (ref 6.5–8.1)

## 2023-07-12 LAB — FERRITIN: Ferritin: 1788 ng/mL — ABNORMAL HIGH (ref 11–307)

## 2023-07-12 LAB — IRON AND IRON BINDING CAPACITY (CC-WL,HP ONLY)
Iron: 129 ug/dL (ref 28–170)
Saturation Ratios: 62 % — ABNORMAL HIGH (ref 10.4–31.8)
TIBC: 207 ug/dL — ABNORMAL LOW (ref 250–450)
UIBC: 78 ug/dL — ABNORMAL LOW (ref 148–442)

## 2023-07-12 LAB — RETIC PANEL
Immature Retic Fract: 1.8 % — ABNORMAL LOW (ref 2.3–15.9)
RBC.: 4.61 MIL/uL (ref 3.87–5.11)
Retic Count, Absolute: 20.7 10*3/uL (ref 19.0–186.0)
Retic Ct Pct: 0.5 % (ref 0.4–3.1)
Reticulocyte Hemoglobin: 31.5 pg (ref >27.9)

## 2023-07-12 LAB — LACTATE DEHYDROGENASE: LDH: 208 U/L — ABNORMAL HIGH (ref 98–192)

## 2023-07-12 MED ORDER — FOLIC ACID 1 MG PO TABS: 1.0000 mg | ORAL_TABLET | Freq: Every day | ORAL | 0 refills | Status: DC

## 2023-07-12 NOTE — Progress Notes (Signed)
[facility] [phone]   [phone]  PROGRESS NOTE  Patient Care Team: Tamala Ser as PCP - General (Physician Assistant) Center, [facility]  Hematological/Oncological History # Normocytic Anemia  04/06/2023: WBC 7.2, Hgb 11.6, MCV 89.6, Plt 178 05/24/2023: WBC 9.0, Hgb 7.1, MCV 84.8, Plt 304 06/01/2023: Hgb 9.2 06/13/2023: establish care with Dr. Leonides Schanz.  Hemoglobin 8.4 with reticulocyte 7.2% 07/12/2023: Hemoglobin 12.1 with reticulocyte count 0.5%  Interval History:  Christina Vasquez 67 y.o. female with medical history significant for normocytic anemia suspected to be hemorrhage secondary to surgery who presents for a follow up visit. The patient's last visit was on 06/13/2023. In the interim since the last visit the patient's hemoglobin has normalized and her robust reticulocytosis has also returned to normal.  On exam today Ms. Tande reports that she went to [employer] and ate beef liver for 2 days and then did it for another 2 days.  She reports that she was told by her dialysis center that her hemoglobin levels have improved.  She reports that she thinks that the source of blood loss was the inflamed fistula that was operated on on 06/01/2023.  She reports that there was some bleeding postoperatively as well.  She reports that today she feels much better and is not as tired.  Her energy is about a 7 out of 10 when previously it was a 2 out of 10.  She is not having any lightheadedness, dizziness, or shortness of breath.  Overall she feels well.  A full 10 point ROS is otherwise negative.  MEDICAL HISTORY:  Past Medical History:  Diagnosis Date   Anemia    Anxiety    Arthritis    Hyperparathyroidism (HCC)    Hypertension    PONV (postoperative nausea and vomiting)    Happened one time. Has had sx since then with no N/V   Renal disorder 12/19/2018   Stage 4 ESRD. HD-MWF   Sleep apnea     SURGICAL HISTORY: Past Surgical History:   Procedure Laterality Date   A/V FISTULAGRAM  2019   ABDOMINAL HYSTERECTOMY  2005   BREAST EXCISIONAL BIOPSY Left 2017   scar not visible   CHOLECYSTECTOMY  2002   COLONOSCOPY  2010   COLONOSCOPY WITH PROPOFOL N/A 01/02/2023   Procedure: COLONOSCOPY WITH PROPOFOL;  Surgeon: Jeani Hawking, MD;  Location: [facility];  Service: Gastroenterology;  Laterality: N/A;   IR AV DIALY SHUNT INTRO NEEDLE/INTRAC INITIAL W/PTA/STENT/IMG LT  2021   Stent placed in Left Arm Fistula. Placed in [place]   LIGATION OF ARTERIOVENOUS  FISTULA Left 06/01/2023   Procedure: LIGATION OF LEFT ARM ARTERIOVENOUS  FISTULA WITH HEMATOMA EVACUATION;  Surgeon: Maeola Harman, MD;  Location: [facility] OR;  Service: Vascular;  Laterality: Left;   POLYPECTOMY  01/02/2023   Procedure: POLYPECTOMY;  Surgeon: Jeani Hawking, MD;  Location: [facility];  Service: Gastroenterology;;   REVERSE SHOULDER ARTHROPLASTY Right 06/01/2022   Procedure: REVERSE SHOULDER ARTHROPLASTY;  Surgeon: Bjorn Pippin, MD;  Location: [facility] OR;  Service: Orthopedics;  Laterality: Right;   REVERSE SHOULDER ARTHROPLASTY Left 09/21/2022   Procedure: REVERSE SHOULDER ARTHROPLASTY;  Surgeon: Bjorn Pippin, MD;  Location: [facility] OR;  Service: Orthopedics;  Laterality: Left;   TOTAL KNEE ARTHROPLASTY Right 2008   TUBAL LIGATION      SOCIAL HISTORY: Social History   Socioeconomic History   Marital status: Widowed    Spouse name: Not on file   Number of children: 2  Years of education: Not on file   Highest education level: Not on file  Occupational History   Not on file  Tobacco Use   Smoking status: Never   Smokeless tobacco: Never  Vaping Use   Vaping status: Never Used  Substance and Sexual Activity   Alcohol use: Not Currently   Drug use: Never   Sexual activity: Not Currently  Other Topics Concern   Not on file  Social History Narrative   Not on file   Social Determinants of Health   Financial Resource Strain: Low Risk  (07/04/2018)    Received from [facility], [facility]   Overall Financial Resource Strain (CARDIA)    Difficulty of Paying Living Expenses: Not hard at all  Food Insecurity: No Food Insecurity (07/04/2018)   Received from [facility], [facility]   Hunger Vital Sign    Worried About Running Out of Food in the Last Year: Never true    Ran Out of Food in the Last Year: Never true  Transportation Needs: No Transportation Needs (07/04/2018)   Received from [facility], [facility]   PRAPARE - Transportation    Lack of Transportation (Medical): No    Lack of Transportation (Non-Medical): No  Physical Activity: Unknown (07/04/2018)   Received from [facility], [facility]   Exercise Vital Sign    Days of Exercise per Week: 0 days    Minutes of Exercise per Session: Not on file  Stress: Stress Concern Present (07/04/2018)   Received from [facility], [facility]   [employer] of Occupational Health - Occupational Stress Questionnaire    Feeling of Stress : Very much  Social Connections: Moderately Isolated (07/04/2018)   Received from [facility], [facility]   Social Connection and Isolation Panel [NHANES]    Frequency of Communication with Friends and Family: Twice a week    Frequency of Social Gatherings with Friends and Family: Never    Attends Religious Services: More than 4 times per year    Active Member of [employer] or Organizations: No    Attends Banker Meetings: Never    Marital Status: Widowed  Intimate Partner Violence: Not At Risk (01/27/2020)   Received from [facility], [facility]   Humiliation, Afraid, Rape, and Kick questionnaire    Fear of Current or Ex-Partner: No    Emotionally Abused: No    Physically Abused: No    Sexually Abused: No    FAMILY  HISTORY: Family History  Problem Relation Age of Onset   Breast cancer Sister    Breast cancer Cousin        maternal first cousin    ALLERGIES:  is allergic to meloxicam, augmentin [amoxicillin-pot clavulanate], and tetracyclines & related.  MEDICATIONS:  Current Outpatient Medications  Medication Sig Dispense Refill   folic acid (FOLVITE) 1 MG tablet Take 1 tablet (1 mg total) by mouth daily. 90 tablet 0   acetaminophen (TYLENOL) 650 MG CR tablet Take 1,300 mg by mouth every 8 (eight) hours as needed for pain.     amLODipine (NORVASC) 10 MG tablet Take 10 mg by mouth daily.     cephALEXin (KEFLEX) 250 MG capsule Take 250 mg by mouth daily.     cinacalcet (SENSIPAR) 60 MG tablet Take 60 mg by mouth every Monday, Wednesday, and Friday with hemodialysis.  metoprolol succinate (TOPROL-XL) 50 MG 24 hr tablet Take 50 mg by mouth at bedtime. Take with or immediately following a meal.     omeprazole (PRILOSEC) 40 MG capsule Take 40 mg by mouth daily.     sucroferric oxyhydroxide (VELPHORO) 500 MG chewable tablet Chew 1,500 mg by mouth 3 (three) times daily with meals.     venlafaxine XR (EFFEXOR-XR) 37.5 MG 24 hr capsule Take 37.5 mg by mouth daily with breakfast.     VITAMIN D PO Take 4 capsules by mouth every Monday, Wednesday, and Friday with hemodialysis.     Current Facility-Administered Medications  Medication Dose Route Frequency Provider Last Rate Last Admin   diclofenac Sodium (VOLTAREN) 1 % topical gel 4 g  4 g Topical QID Huel Cote, MD        REVIEW OF SYSTEMS:   Constitutional: ( - ) fevers, ( - )  chills , ( - ) night sweats Eyes: ( - ) blurriness of vision, ( - ) double vision, ( - ) watery eyes Ears, nose, mouth, throat, and face: ( - ) mucositis, ( - ) sore throat Respiratory: ( - ) cough, ( - ) dyspnea, ( - ) wheezes Cardiovascular: ( - ) palpitation, ( - ) chest discomfort, ( - ) lower extremity swelling Gastrointestinal:  ( - ) nausea, ( - ) heartburn, ( - )  change in bowel habits Skin: ( - ) abnormal skin rashes Lymphatics: ( - ) new lymphadenopathy, ( - ) easy bruising Neurological: ( - ) numbness, ( - ) tingling, ( - ) new weaknesses Behavioral/Psych: ( - ) mood change, ( - ) new changes  All other systems were reviewed with the patient and are negative.  PHYSICAL EXAMINATION:  Vitals:   07/12/23 0825  BP: (!) 165/100  Pulse: 79  Resp: 15  Temp: 98.2 F (36.8 C)  SpO2: 100%   Filed Weights   07/12/23 0825  Weight: 216 lb 11.2 oz (98.3 kg)    GENERAL: Well-appearing elderly African-American female, alert, no distress and comfortable SKIN: skin color, texture, turgor are normal, no rashes or significant lesions EYES: conjunctiva are pink and non-injected, sclera clear LUNGS: clear to auscultation and percussion with normal breathing effort HEART: regular rate & rhythm and no murmurs and no lower extremity edema Musculoskeletal: no cyanosis of digits and no clubbing  PSYCH: alert & oriented x 3, fluent speech NEURO: no focal motor/sensory deficits  LABORATORY DATA:  I have reviewed the data as listed    Latest Ref Rng & Units 07/12/2023    7:47 AM 06/13/2023    1:41 PM 06/01/2023   12:24 PM  CBC  WBC 4.0 - 10.5 K/uL 4.8  7.4    Hemoglobin 12.0 - 15.0 g/dL 16.1  8.4  9.2   Hematocrit 36.0 - 46.0 % 38.3  26.5  27.0   Platelets 150 - 400 K/uL 154  190         Latest Ref Rng & Units 07/12/2023    7:47 AM 06/13/2023    1:41 PM 06/01/2023   12:24 PM  CMP  Glucose 70 - 99 mg/dL 76  096  98   BUN 8 - 23 mg/dL 51  11  12   Creatinine 0.44 - 1.00 mg/dL 0.45  4.09  8.11   Sodium 135 - 145 mmol/L 140  141  137   Potassium 3.5 - 5.1 mmol/L 4.5  3.2  3.8   Chloride 98 - 111 mmol/L 100  98  97   CO2 22 - 32 mmol/L 27  35    Calcium 8.9 - 10.3 mg/dL 9.3  8.2    Total Protein 6.5 - 8.1 g/dL 6.9  6.8    Total Bilirubin 0.3 - 1.2 mg/dL 0.4  0.4    Alkaline Phos 38 - 126 U/L 74  79    AST 15 - 41 U/L 15  16    ALT 0 - 44 U/L 14  10       Lab Results  Component Value Date   MPROTEIN Not Observed 06/13/2023   Lab Results  Component Value Date   KPAFRELGTCHN 105.9 (H) 06/13/2023   LAMBDASER 85.3 (H) 06/13/2023   KAPLAMBRATIO 1.24 06/13/2023    RADIOGRAPHIC STUDIES: No results found.  ASSESSMENT & PLAN Vincenta Szoke 67 y.o. female with medical history significant for normocytic anemia suspected to be hemorrhage secondary to surgery who presents for a follow up visit.  # Normocytic Anemia-resolved  # Suspect Acute Blood Loss Anemia-resolved -- No concerning findings on her full nutritional panel including iron panel, ferritin, Vitamin b12, and folate.  Of note her folate was reduced to 6.0.  Would recommend folic acid medication for 90 days in order to help bolster her folate levels. -- additionally r/o MM with SPEP and SFLC -- Patient had robust reticulocytosis at last visit, strongly suspect that this reflected blood loss which has subsequently normalized. -- will order baseline erythropoietin level  --Labs today show a white blood cell 4.8, hemoglobin 12.1, MCV 82.2, and platelets of 154.  Due to normalization of her levels I would recommend she follow with her dialysis unit/nephrologist NP we referred in the event her hemoglobin levels were to drop again. --RTC on a as needed basis.    No orders of the defined types were placed in this encounter.   All questions were answered. The patient knows to call the clinic with any problems, questions or concerns.  A total of more than 25 minutes were spent on this encounter with face-to-face time and non-face-to-face time, including preparing to see the patient, ordering tests and/or medications, counseling the patient and coordination of care as outlined above.   Ulysees Barns, MD Department of Hematology/Oncology [facility] Phone: [phone] Pager: [phone] Email: [email or web address]  07/12/2023 9:18 AM

## 2023-07-14 LAB — HAPTOGLOBIN: Haptoglobin: 57 mg/dL (ref 37–355)

## 2023-07-23 ENCOUNTER — Other Ambulatory Visit: Payer: Self-pay

## 2023-07-23 ENCOUNTER — Encounter (HOSPITAL_COMMUNITY): Payer: Self-pay | Admitting: Vascular Surgery

## 2023-07-23 NOTE — Progress Notes (Signed)
PCP - Cindra Presume, PA-C Cardiologist - None  Gastro - Dr Jeani Hawking Hematology & Oncology - Dr Jeanie Sewer Nephrology - Dr Sabra Heck  Chest x-ray - n/a EKG - 05/24/23 Stress Test - 05/21/18 ECHO -05/27/18 Cardiac Cath - n/a  ICD Pacemaker/Loop - n/a  Sleep Study -  Yes, 2018 CPAP - does not use CPAP  Diabetes - n/a  NPO  Anesthesia review: Yes  STOP now taking any Aspirin (unless otherwise instructed by your surgeon), Aleve, Naproxen, Ibuprofen, Motrin, Advil, Goody's, BC's, all herbal medications, fish oil, and all vitamins.   Coronavirus Screening Do you have any of the following symptoms:  Cough yes/no: No Fever (>100.71F)  yes/no: No Runny nose yes/no: No Sore throat yes/no: No Difficulty breathing/shortness of breath  yes/no: No  Have you traveled in the last 14 days and where? yes/no: No  Patient verbalized understanding of instructions that were given via phone.

## 2023-07-24 ENCOUNTER — Ambulatory Visit (HOSPITAL_COMMUNITY): Payer: Medicare Other | Admitting: Physician Assistant

## 2023-07-24 ENCOUNTER — Encounter (HOSPITAL_COMMUNITY): Payer: Self-pay | Admitting: Vascular Surgery

## 2023-07-24 ENCOUNTER — Ambulatory Visit (HOSPITAL_COMMUNITY)
Admission: RE | Admit: 2023-07-24 | Discharge: 2023-07-24 | Disposition: A | Discharge status: Home / Self Care | Payer: Medicare Other | Attending: Vascular Surgery | Admitting: Vascular Surgery

## 2023-07-24 ENCOUNTER — Other Ambulatory Visit: Payer: Self-pay

## 2023-07-24 ENCOUNTER — Encounter (HOSPITAL_COMMUNITY)
Admission: RE | Disposition: A | Discharge status: Home / Self Care | Payer: Self-pay | Source: Home / Self Care | Attending: Vascular Surgery

## 2023-07-24 ENCOUNTER — Ambulatory Visit (HOSPITAL_BASED_OUTPATIENT_CLINIC_OR_DEPARTMENT_OTHER): Payer: Medicare Other | Admitting: Physician Assistant

## 2023-07-24 DIAGNOSIS — Z992 Dependence on renal dialysis: Secondary | ICD-10-CM | POA: Insufficient documentation

## 2023-07-24 DIAGNOSIS — I120 Hypertensive chronic kidney disease with stage 5 chronic kidney disease or end stage renal disease: Secondary | ICD-10-CM | POA: Insufficient documentation

## 2023-07-24 DIAGNOSIS — Z8672 Personal history of thrombophlebitis: Secondary | ICD-10-CM | POA: Insufficient documentation

## 2023-07-24 DIAGNOSIS — X58XXXA Exposure to other specified factors, initial encounter: Secondary | ICD-10-CM | POA: Diagnosis not present

## 2023-07-24 DIAGNOSIS — N185 Chronic kidney disease, stage 5: Secondary | ICD-10-CM | POA: Diagnosis not present

## 2023-07-24 DIAGNOSIS — Z79899 Other long term (current) drug therapy: Secondary | ICD-10-CM | POA: Diagnosis not present

## 2023-07-24 DIAGNOSIS — N186 End stage renal disease: Secondary | ICD-10-CM

## 2023-07-24 DIAGNOSIS — K219 Gastro-esophageal reflux disease without esophagitis: Secondary | ICD-10-CM | POA: Diagnosis not present

## 2023-07-24 DIAGNOSIS — T82868A Thrombosis of vascular prosthetic devices, implants and grafts, initial encounter: Secondary | ICD-10-CM | POA: Insufficient documentation

## 2023-07-24 DIAGNOSIS — Z6839 Body mass index (BMI) 39.0-39.9, adult: Secondary | ICD-10-CM | POA: Insufficient documentation

## 2023-07-24 DIAGNOSIS — G4730 Sleep apnea, unspecified: Secondary | ICD-10-CM | POA: Diagnosis not present

## 2023-07-24 DIAGNOSIS — M1990 Unspecified osteoarthritis, unspecified site: Secondary | ICD-10-CM | POA: Diagnosis not present

## 2023-07-24 HISTORY — PX: AV FISTULA PLACEMENT: SHX1204

## 2023-07-24 LAB — POCT I-STAT, CHEM 8
BUN: 34 mg/dL — ABNORMAL HIGH (ref 8–23)
Calcium, Ion: 1.13 mmol/L — ABNORMAL LOW (ref 1.15–1.40)
Chloride: 100 mmol/L (ref 98–111)
Creatinine, Ser: 5.8 mg/dL — ABNORMAL HIGH (ref 0.44–1.00)
Glucose, Bld: 83 mg/dL (ref 70–99)
HCT: 35 % — ABNORMAL LOW (ref 36.0–46.0)
Hemoglobin: 11.9 g/dL — ABNORMAL LOW (ref 12.0–15.0)
Potassium: 4 mmol/L (ref 3.5–5.1)
Sodium: 140 mmol/L (ref 135–145)
TCO2: 26 mmol/L (ref 22–32)

## 2023-07-24 SURGERY — ARTERIOVENOUS (AV) FISTULA CREATION
Anesthesia: Monitor Anesthesia Care | Site: Arm Upper | Laterality: Right

## 2023-07-24 MED ORDER — PROPOFOL 10 MG/ML IV BOLUS
INTRAVENOUS | Status: DC | PRN
Start: 2023-07-24 — End: 2023-07-24
  Administered 2023-07-24: 100 ug/kg/min via INTRAVENOUS
  Administered 2023-07-24: 100 mg via INTRAVENOUS
  Administered 2023-07-24: 75 ug/kg/min via INTRAVENOUS

## 2023-07-24 MED ORDER — FENTANYL CITRATE (PF) 100 MCG/2ML IJ SOLN
INTRAMUSCULAR | Status: DC | PRN
  Administered 2023-07-24: 50 ug via INTRAVENOUS

## 2023-07-24 MED ORDER — METOPROLOL SUCCINATE ER 25 MG PO TB24: 50.0000 mg | ORAL_TABLET | Freq: Once | ORAL | Status: AC

## 2023-07-24 MED ORDER — SODIUM CHLORIDE 0.9 % IV SOLN: INTRAVENOUS | Status: DC

## 2023-07-24 MED ORDER — VANCOMYCIN HCL IN DEXTROSE 1-5 GM/200ML-% IV SOLN: 1000.0000 mg | INTRAVENOUS | Status: AC

## 2023-07-24 MED ORDER — CHLORHEXIDINE GLUCONATE 0.12 % MT SOLN: 15.0000 mL | Freq: Once | OROMUCOSAL | Status: AC

## 2023-07-24 MED ORDER — ONDANSETRON HCL 4 MG/2ML IJ SOLN
INTRAMUSCULAR | Status: DC | PRN
  Administered 2023-07-24: 4 mg via INTRAVENOUS

## 2023-07-24 MED ORDER — ORAL CARE MOUTH RINSE: 15.0000 mL | Freq: Once | OROMUCOSAL | Status: AC

## 2023-07-24 MED ORDER — ONDANSETRON HCL 4 MG/2ML IJ SOLN
INTRAMUSCULAR | Status: AC
  Filled 2023-07-24: qty 2

## 2023-07-24 MED ORDER — VANCOMYCIN HCL IN DEXTROSE 1-5 GM/200ML-% IV SOLN
INTRAVENOUS | Status: AC
  Administered 2023-07-24: 1000 mg via INTRAVENOUS
  Filled 2023-07-24: qty 200

## 2023-07-24 MED ORDER — CHLORHEXIDINE GLUCONATE 0.12 % MT SOLN
OROMUCOSAL | Status: AC
  Administered 2023-07-24: 15 mL via OROMUCOSAL
  Filled 2023-07-24: qty 15

## 2023-07-24 MED ORDER — CHLORHEXIDINE GLUCONATE 4 % EX SOLN: 60.0000 mL | Freq: Once | CUTANEOUS | Status: DC

## 2023-07-24 MED ORDER — MIDAZOLAM HCL 2 MG/2ML IJ SOLN: 1.0000 mg | Freq: Once | INTRAMUSCULAR | Status: AC

## 2023-07-24 MED ORDER — MEPIVACAINE HCL (PF) 2 % IJ SOLN
INTRAMUSCULAR | Status: DC | PRN
  Administered 2023-07-24: 10 mL

## 2023-07-24 MED ORDER — LIDOCAINE-EPINEPHRINE (PF) 1 %-1:200000 IJ SOLN
INTRAMUSCULAR | Status: AC
  Filled 2023-07-24: qty 30

## 2023-07-24 MED ORDER — LIDOCAINE-EPINEPHRINE (PF) 1 %-1:200000 IJ SOLN
INTRAMUSCULAR | Status: DC | PRN
  Administered 2023-07-24: 29 mL

## 2023-07-24 MED ORDER — PHENYLEPHRINE 80 MCG/ML (10ML) SYRINGE FOR IV PUSH (FOR BLOOD PRESSURE SUPPORT)
PREFILLED_SYRINGE | INTRAVENOUS | Status: AC
  Filled 2023-07-24: qty 10

## 2023-07-24 MED ORDER — HEPARIN SODIUM (PORCINE) 1000 UNIT/ML IJ SOLN
INTRAMUSCULAR | Status: DC | PRN
Start: 2023-07-24 — End: 2023-07-24
  Administered 2023-07-24: 8000 [IU] via INTRAVENOUS

## 2023-07-24 MED ORDER — HEPARIN 6000 UNIT IRRIGATION SOLUTION
Status: AC
  Filled 2023-07-24: qty 500

## 2023-07-24 MED ORDER — HEPARIN SODIUM (PORCINE) 1000 UNIT/ML IJ SOLN
INTRAMUSCULAR | Status: AC
  Filled 2023-07-24: qty 10

## 2023-07-24 MED ORDER — PROPOFOL 10 MG/ML IV BOLUS
INTRAVENOUS | Status: AC
  Filled 2023-07-24: qty 20

## 2023-07-24 MED ORDER — PROTAMINE SULFATE 10 MG/ML IV SOLN
INTRAVENOUS | Status: DC | PRN
  Administered 2023-07-24: 30 mg via INTRAVENOUS
  Administered 2023-07-24: 10 mg via INTRAVENOUS

## 2023-07-24 MED ORDER — LIDOCAINE 2% (20 MG/ML) 5 ML SYRINGE
INTRAMUSCULAR | Status: AC
  Filled 2023-07-24: qty 5

## 2023-07-24 MED ORDER — OXYCODONE HCL 5 MG PO TABS
5.0000 mg | ORAL_TABLET | Freq: Four times a day (QID) | ORAL | 0 refills | Status: AC | PRN
Start: 2023-07-24 — End: ?

## 2023-07-24 MED ORDER — 0.9 % SODIUM CHLORIDE (POUR BTL) OPTIME
TOPICAL | Status: DC | PRN
  Administered 2023-07-24: 1000 mL

## 2023-07-24 MED ORDER — LIDOCAINE HCL (PF) 1 % IJ SOLN
INTRAMUSCULAR | Status: AC
  Filled 2023-07-24: qty 30

## 2023-07-24 MED ORDER — HEPARIN 6000 UNIT IRRIGATION SOLUTION
Status: DC | PRN
  Administered 2023-07-24: 1

## 2023-07-24 MED ORDER — LIDOCAINE 2% (20 MG/ML) 5 ML SYRINGE
INTRAMUSCULAR | Status: DC | PRN
  Administered 2023-07-24: 100 mg via INTRAVENOUS

## 2023-07-24 MED ORDER — MIDAZOLAM HCL 2 MG/2ML IJ SOLN
INTRAMUSCULAR | Status: AC
  Administered 2023-07-24: 1 mg via INTRAVENOUS
  Filled 2023-07-24: qty 2

## 2023-07-24 MED ORDER — FENTANYL CITRATE (PF) 100 MCG/2ML IJ SOLN: 50.0000 ug | Freq: Once | INTRAMUSCULAR | Status: AC

## 2023-07-24 MED ORDER — LIDOCAINE-EPINEPHRINE (PF) 1.5 %-1:200000 IJ SOLN
INTRAMUSCULAR | Status: DC | PRN
Start: 2023-07-24 — End: 2023-07-24
  Administered 2023-07-24: 20 mL via PERINEURAL

## 2023-07-24 MED ORDER — FENTANYL CITRATE (PF) 100 MCG/2ML IJ SOLN
INTRAMUSCULAR | Status: AC
  Administered 2023-07-24: 50 ug via INTRAVENOUS
  Filled 2023-07-24: qty 2

## 2023-07-24 MED ORDER — METOPROLOL SUCCINATE ER 25 MG PO TB24
ORAL_TABLET | ORAL | Status: AC
  Administered 2023-07-24: 50 mg via ORAL
  Filled 2023-07-24: qty 2

## 2023-07-24 MED ORDER — MIDAZOLAM HCL 2 MG/2ML IJ SOLN
INTRAMUSCULAR | Status: DC | PRN
Start: 2023-07-24 — End: 2023-07-24
  Administered 2023-07-24: 1 mg via INTRAVENOUS

## 2023-07-24 SURGICAL SUPPLY — 35 items
ADH SKN CLS APL DERMABOND .7 (GAUZE/BANDAGES/DRESSINGS) ×1
ARMBAND PINK RESTRICT EXTREMIT (MISCELLANEOUS) ×1 IMPLANT
BAG COUNTER SPONGE SURGICOUNT (BAG) ×1 IMPLANT
BAG SPNG CNTER NS LX DISP (BAG) ×1
CANISTER SUCT 3000ML PPV (MISCELLANEOUS) ×1 IMPLANT
CLIP LIGATING EXTRA MED SLVR (CLIP) ×1 IMPLANT
CLIP LIGATING EXTRA SM BLUE (MISCELLANEOUS) ×1 IMPLANT
CLIP TI MEDIUM 6 (CLIP) IMPLANT
CLIP TI WIDE RED SMALL 6 (CLIP) IMPLANT
COVER PROBE W GEL 5X96 (DRAPES) IMPLANT
DERMABOND ADVANCED .7 DNX12 (GAUZE/BANDAGES/DRESSINGS) ×1 IMPLANT
ELECT REM PT RETURN 9FT ADLT (ELECTROSURGICAL) ×1
ELECTRODE REM PT RTRN 9FT ADLT (ELECTROSURGICAL) ×1 IMPLANT
GLOVE BIO SURGEON STRL SZ7.5 (GLOVE) ×1 IMPLANT
GOWN STRL REUS W/ TWL LRG LVL3 (GOWN DISPOSABLE) ×2 IMPLANT
GOWN STRL REUS W/ TWL XL LVL3 (GOWN DISPOSABLE) ×1 IMPLANT
GOWN STRL REUS W/TWL LRG LVL3 (GOWN DISPOSABLE) ×2
GOWN STRL REUS W/TWL XL LVL3 (GOWN DISPOSABLE) ×1
GRAFT GORETEX STRT 4-7X45 (Vascular Products) IMPLANT
INSERT FOGARTY SM (MISCELLANEOUS) IMPLANT
KIT BASIN OR (CUSTOM PROCEDURE TRAY) ×1 IMPLANT
KIT TURNOVER KIT B (KITS) ×1 IMPLANT
NS IRRIG 1000ML POUR BTL (IV SOLUTION) ×1 IMPLANT
PACK CV ACCESS (CUSTOM PROCEDURE TRAY) ×1 IMPLANT
PAD ARMBOARD 7.5X6 YLW CONV (MISCELLANEOUS) ×2 IMPLANT
POWDER SURGICEL 3.0 GRAM (HEMOSTASIS) IMPLANT
SLING ARM FOAM STRAP LRG (SOFTGOODS) IMPLANT
SLING ARM FOAM STRAP MED (SOFTGOODS) IMPLANT
SUT MNCRL AB 4-0 PS2 18 (SUTURE) ×1 IMPLANT
SUT PROLENE 6 0 BV (SUTURE) ×1 IMPLANT
SUT VIC AB 3-0 SH 27 (SUTURE) ×2
SUT VIC AB 3-0 SH 27X BRD (SUTURE) ×1 IMPLANT
TOWEL GREEN STERILE (TOWEL DISPOSABLE) ×1 IMPLANT
UNDERPAD 30X36 HEAVY ABSORB (UNDERPADS AND DIAPERS) ×1 IMPLANT
WATER STERILE IRR 1000ML POUR (IV SOLUTION) ×1 IMPLANT

## 2023-07-24 NOTE — Anesthesia Procedure Notes (Signed)
Anesthesia Regional Block: Supraclavicular block   Pre-Anesthetic Checklist: , timeout performed,  Correct Patient, Correct Site, Correct Laterality,  Correct Procedure, Correct Position, site marked,  Risks and benefits discussed,  Surgical consent,  Pre-op evaluation,  At surgeon's request and post-op pain management  Laterality: Right  Prep: chloraprep       Needles:  Injection technique: Single-shot  Needle Type: Echogenic Needle     Needle Length: 9cm      Additional Needles:   Procedures:,,,, ultrasound used (permanent image in chart),,    Narrative:  Start time: 07/24/2023 9:28 AM End time: 07/24/2023 9:38 AM Injection made incrementally with aspirations every 5 mL.  Performed by: Personally  Anesthesiologist: Eilene Ghazi, MD  Additional Notes: Patient tolerated the procedure well without complications

## 2023-07-24 NOTE — Transfer of Care (Signed)
Immediate Anesthesia Transfer of Care Note  Patient: Christina Vasquez  Procedure(s) Performed: RIGHT UPPER EXTREMITY ARTERIOVENOUS GRAFT CREATION (Right: Arm Upper)  Patient Location: PACU  Anesthesia Type:MAC  Level of Consciousness: awake, oriented, and patient cooperative  Airway & Oxygen Therapy: Patient Spontanous Breathing and Patient connected to face mask oxygen  Post-op Assessment: Report given to RN and Post -op Vital signs reviewed and stable  Post vital signs: Reviewed and stable  Last Vitals:  Vitals Value Taken Time  BP 167/76 07/24/23 1213  Temp    Pulse 92 07/24/23 1215  Resp 12 07/24/23 1215  SpO2 100 % 07/24/23 1215  Vitals shown include unfiled device data.  Last Pain:  Vitals:   07/24/23 0920  TempSrc:   PainSc: 0-No pain         Complications: No notable events documented.

## 2023-07-24 NOTE — Interval H&P Note (Signed)
History and Physical Interval Note:  07/24/2023 9:29 AM  Christina Vasquez  has presented today for surgery, with the diagnosis of ESRD.  The various methods of treatment have been discussed with the patient and family. After consideration of risks, benefits and other options for treatment, the patient has consented to  Procedure(s): RIGHT UPPER EXTREMITY FIRST STAGE BASILIC FISTULA VERSUS ARTERIOVENOUS GRAFT CREATION (Right) as a surgical intervention.  The patient's history has been reviewed, patient examined, no change in status, stable for surgery.  I have reviewed the patient's chart and labs.  Questions were answered to the patient's satisfaction.     Waverly Ferrari

## 2023-07-24 NOTE — Anesthesia Preprocedure Evaluation (Addendum)
Anesthesia Evaluation  Patient identified by MRN, date of birth, ID band Patient awake    Reviewed: Allergy & Precautions, H&P , NPO status , Patient's Chart, lab work & pertinent test results  History of Anesthesia Complications (+) PONV and history of anesthetic complications  Airway Mallampati: II  TM Distance: >3 FB Neck ROM: Full    Dental no notable dental hx.    Pulmonary sleep apnea    Pulmonary exam normal breath sounds clear to auscultation       Cardiovascular hypertension, Pt. on medications and Pt. on home beta blockers Normal cardiovascular exam Rhythm:Regular Rate:Normal     Neuro/Psych negative neurological ROS  negative psych ROS   GI/Hepatic Neg liver ROS,GERD  Medicated,,  Endo/Other    Morbid obesity  Renal/GU ESRF and DialysisRenal disease  negative genitourinary   Musculoskeletal  (+) Arthritis ,    Abdominal   Peds negative pediatric ROS (+)  Hematology negative hematology ROS (+)   Anesthesia Other Findings   Reproductive/Obstetrics negative OB ROS                             Anesthesia Physical Anesthesia Plan  ASA: 3  Anesthesia Plan: MAC   Post-op Pain Management: Regional block*   Induction: Intravenous  PONV Risk Score and Plan: 3 and Propofol infusion, Ondansetron and Treatment may vary due to age or medical condition  Airway Management Planned: Simple Face Mask  Additional Equipment:   Intra-op Plan:   Post-operative Plan:   Informed Consent: I have reviewed the patients History and Physical, chart, labs and discussed the procedure including the risks, benefits and alternatives for the proposed anesthesia with the patient or authorized representative who has indicated his/her understanding and acceptance.     Dental advisory given  Plan Discussed with: CRNA and Surgeon  Anesthesia Plan Comments:        Anesthesia Quick  Evaluation

## 2023-07-24 NOTE — Anesthesia Procedure Notes (Signed)
Anesthesia Procedure Image       

## 2023-07-24 NOTE — Anesthesia Postprocedure Evaluation (Signed)
Anesthesia Post Note  Patient: Christina Vasquez  Procedure(s) Performed: RIGHT UPPER EXTREMITY ARTERIOVENOUS GRAFT CREATION (Right: Arm Upper)     Patient location during evaluation: PACU Anesthesia Type: MAC Level of consciousness: awake and alert Pain management: pain level controlled Vital Signs Assessment: post-procedure vital signs reviewed and stable Respiratory status: spontaneous breathing, nonlabored ventilation, respiratory function stable and patient connected to nasal cannula oxygen Cardiovascular status: stable and blood pressure returned to baseline Postop Assessment: no apparent nausea or vomiting Anesthetic complications: no  No notable events documented.  Last Vitals:  Vitals:   07/24/23 1215 07/24/23 1230  BP: (!) 162/80 (!) 153/89  Pulse: 92 84  Resp: 12 13  Temp:  36.6 C  SpO2: 100% 100%    Last Pain:  Vitals:   07/24/23 1230  TempSrc:   PainSc: 0-No pain                 [name] S

## 2023-07-24 NOTE — Discharge Instructions (Signed)
[facility]  Discharge Instructions  AV Fistula or Graft Surgery for Dialysis Access  Please refer to the following instructions for your post-procedure care. Your surgeon or physician assistant will discuss any changes with you.  Activity  You may drive the day following your surgery, if you are comfortable and no longer taking prescription pain medication. Resume full activity as the soreness in your incision resolves.  Bathing/Showering  You may shower after you go home. Keep your incision dry for 48 hours. Do not soak in a bathtub, hot tub, or swim until the incision heals completely. You may not shower if you have a hemodialysis catheter.  Incision Care  Clean your incision with mild soap and water after 48 hours. Pat the area dry with a clean towel. You do not need a bandage unless otherwise instructed. Do not apply any ointments or creams to your incision. You may have skin glue on your incision. Do not peel it off. It will come off on its own in about one week. Your arm may swell a bit after surgery. To reduce swelling use pillows to elevate your arm so it is above your heart. Your doctor will tell you if you need to lightly wrap your arm with an ACE bandage.  Diet  Resume your normal diet. There are not special food restrictions following this procedure. In order to heal from your surgery, it is CRITICAL to get adequate nutrition. Your body requires vitamins, minerals, and protein. Vegetables are the best source of vitamins and minerals. Vegetables also provide the perfect balance of protein. Processed food has little nutritional value, so try to avoid this.  Medications  Resume taking all of your medications. If your incision is causing pain, you may take over-the counter pain relievers such as acetaminophen (Tylenol). If you were prescribed a stronger pain medication, please be aware these medications can cause nausea and constipation. Prevent  nausea by taking the medication with a snack or meal. Avoid constipation by drinking plenty of fluids and eating foods with high amount of fiber, such as fruits, vegetables, and grains.  Do not take Tylenol if you are taking prescription pain medications.  Follow up Your surgeon may want to see you in the office following your access surgery. If so, this will be arranged at the time of your surgery.  Please call us immediately for any of the following conditions:  Increased pain, redness, drainage (pus) from your incision site Fever of 101 degrees or higher Severe or worsening pain at your incision site Hand pain or numbness.  Reduce your risk of vascular disease:  Stop smoking. If you would like help, call [facility] at 1-800-QUIT-NOW (646-581-5105) or [place] at 802-641-7827  Manage your cholesterol Maintain a desired weight Control your diabetes Keep your blood pressure down  Dialysis  It will take several weeks to several months for your new dialysis access to be ready for use. Your surgeon will determine when it is okay to use it. Your nephrologist will continue to direct your dialysis. You can continue to use your Permcath until your new access is ready for use.   07/24/2023 Christina Vasquez [id number] 12-Apr-1956  Surgeon(s): Chuck Hint, MD  Procedure(s): RIGHT UPPER EXTREMITY ARTERIOVENOUS GRAFT CREATION   May stick graft immediately   May stick graft on designated area only:   x Do not stick graft for 4 weeks    If you have any questions, please call the office at [phone].

## 2023-07-24 NOTE — Op Note (Signed)
NAME: [name]    MRN: [id number] DOB: September 19, 1956    DATE OF OPERATION: 07/24/2023  PREOP DIAGNOSIS:    End-stage renal disease  POSTOP DIAGNOSIS:    Same  PROCEDURE:    New right upper arm AV graft (4-7 mm PTFE graft)  SURGEON: Di Kindle. Edilia Bo, MD  ASSIST: Kayren Eaves, PA  ANESTHESIA: General  EBL: Minimal  INDICATIONS:    Christina Vasquez is a 67 y.o. female who presents for new access.  FINDINGS:   The basilic vein of the right arm was small and emptied into the brachial system early.  There were no good options for a fistula.  I placed an upper arm graft.  TECHNIQUE:   Patient was taken to the operating room after the block was placed in the right arm.  The right arm was prepped and draped in usual sterile fashion.  I looked at the basilic vein myself with the SonoSite and I did not think this was adequate for a fistula.  I elected to place an upper arm graft as the brachial veins at the antecubital level were quite small.  Longitudinal incision was made over the brachial artery just above the antecubital level.  Here the brachial artery was dissected free and controlled with a vessel loop.  A longitudinal incision was made beneath the axilla.  The high brachial vein was dissected free.  It was a 4 mm vein.  A tunnel was created between the 2 incisions and the patient was heparinized.  A 4-7 mm PTFE graft was tunneled between the 2 incisions.  The brachial artery was clamped proximally distally and a longitudinal arteriotomy was made.  A short segment of the 4 mm end of the graft was excised, the graft was spatulated and sewn end-to-side to the artery using continuous 6-0 Prolene suture.  The graft was then pulled the appropriate length for anastomosis to the high brachial vein.  The vein was ligated distally and spatulated proximally.  The graft was cut to the appropriate length, spatulated and sewn into into the vein using 2 continuous 6-0 Prolene sutures.   At the completion there was an excellent thrill in the graft.  There was a good radial and ulnar signal with the Doppler.  The heparin was partially reversed with protamine.  The wounds were each closed with a deep layer of 3-0 Vicryl and the skin closed with 4-0 Monocryl.  Dermabond was applied.  The patient tolerated the procedure well was transferred to the recovery room in stable condition.  All needle and sponge counts were correct.  Given the complexity of the case,  the assistant was necessary in order to expedient the procedure and safely perform the technical aspects of the operation.  The assistant provided traction and countertraction to assist with exposure of the artery and vein.  They also assisted with suture ligation of multiple venous branches. They also assisted with tunneling of the graft.  They played a critical role for both anastomoses.. These skills, especially following the Prolene suture for the anastomosis, could not have been adequately performed by a scrub tech assistant.    Waverly Ferrari, MD, FACS [facility]  DATE OF DICTATION:   07/24/2023

## 2023-07-25 ENCOUNTER — Ambulatory Visit: Payer: Medicare Other | Admitting: Physician Assistant

## 2023-07-25 ENCOUNTER — Encounter (HOSPITAL_COMMUNITY): Payer: Self-pay | Admitting: Vascular Surgery

## 2023-07-25 ENCOUNTER — Telehealth: Payer: Self-pay

## 2023-07-25 VITALS — BP 166/86 | HR 86 | Temp 98.1°F | Resp 16 | Ht 62.0 in | Wt 218.0 lb

## 2023-07-25 DIAGNOSIS — N186 End stage renal disease: Secondary | ICD-10-CM

## 2023-07-25 NOTE — H&P (View-Only) (Signed)
POST OPERATIVE OFFICE NOTE    CC:  F/u for surgery  HPI:  This is a 67 y.o. female who is s/p new RUA AVG on 07/24/2023 by Dr. Edilia Bo.  She did have a right supraclavicular block.    Pt called today with c/o numbness and coldness of her right thumb and 1-2 fingers.   She states that this has not improved from yesterday.  She is unable to bend her thumb and having a hard time holding her keys.    The pt is on dialysis M//W/F at [facility] location.   Allergies  Allergen Reactions   Meloxicam Hives and Rash   Augmentin [Amoxicillin-Pot Clavulanate] Nausea And Vomiting   Tetracyclines & Related Hives    No current facility-administered medications for this visit.   Current Outpatient Medications  Medication Sig Dispense Refill   oxyCODONE (ROXICODONE) 5 MG immediate release tablet Take 1 tablet (5 mg total) by mouth every 6 (six) hours as needed for severe pain. 10 tablet 0   Facility-Administered Medications Ordered in Other Visits  Medication Dose Route Frequency Provider Last Rate Last Admin   0.9 %  sodium chloride infusion   Intravenous Continuous Maeola Harman, MD       0.9 %  sodium chloride infusion   Intravenous Continuous Jairo Ben, MD 10 mL/hr at 07/24/23 0928 New Bag at 07/24/23 1135   chlorhexidine (HIBICLENS) 4 % liquid 4 Application  60 mL Topical Once Maeola Harman, MD       And   chlorhexidine (HIBICLENS) 4 % liquid 4 Application  60 mL Topical Once Maeola Harman, MD         ROS:  See HPI  Physical Exam:  Today's Vitals   07/25/23 1243  BP: (!) 166/86  Pulse: 86  Resp: 16  Temp: 98.1 F (36.7 C)  TempSrc: Temporal  SpO2: 98%  Weight: 218 lb (98.9 kg)  Height: 5\' 2"  (1.575 m)   Body mass index is 39.87 kg/m.   Incision:  right antecubital and right axillary incisions look fine.  She does have some ecchymosis and fullness around the area that was tunneled for the graft.  Extremities:   There is a  palpable right radial pulse.  She has a brisk right ulnar and radial signal that does not really augment with compression of the graft.  She does have a palmar arch signal as well.  She does have some numbness of the right 1st and 2nd fingers and the thumb.  She is unable to make a complete fist.  There is a thrill/bruit present.  Access is  easily palpable    Assessment/Plan:  This is a 67 y.o. female who is s/p: new RUA AVG on 07/24/2023 by Dr. Edilia Bo.  She did have a right supraclavicular block.  -the pt was seen with Dr. Emeline Gins she has palpable right radial pulse and brisk doppler flow right radial and ulnar signals and does not augment with compression of the graft, he recommended that she continue to exercise her hand and if this does not improve or it gets worse, to contact us by Friday or Monday.  This could be residual effects of the supraclavicular block and may need another day to wear off.  She and her daughter are aware that she may require graft removal if this does not improve.  She will keep her appt 08/10/2023 or sooner if her sx do not improve.     Doreatha Massed, [facility] [phone]  Clinic MD:  pt seen with Dr. Edilia Bo

## 2023-07-25 NOTE — Progress Notes (Signed)
POST OPERATIVE OFFICE NOTE    CC:  F/u for surgery  HPI:  This is a 67 y.o. female who is s/p new RUA AVG on 07/24/2023 by Dr. Edilia Bo.  She did have a right supraclavicular block.    Pt called today with c/o numbness and coldness of her right thumb and 1-2 fingers.   She states that this has not improved from yesterday.  She is unable to bend her thumb and having a hard time holding her keys.    The pt is on dialysis M//W/F at [facility] location.   Allergies  Allergen Reactions   Meloxicam Hives and Rash   Augmentin [Amoxicillin-Pot Clavulanate] Nausea And Vomiting   Tetracyclines & Related Hives    No current facility-administered medications for this visit.   Current Outpatient Medications  Medication Sig Dispense Refill   oxyCODONE (ROXICODONE) 5 MG immediate release tablet Take 1 tablet (5 mg total) by mouth every 6 (six) hours as needed for severe pain. 10 tablet 0   Facility-Administered Medications Ordered in Other Visits  Medication Dose Route Frequency Provider Last Rate Last Admin   0.9 %  sodium chloride infusion   Intravenous Continuous Maeola Harman, MD       0.9 %  sodium chloride infusion   Intravenous Continuous Jairo Ben, MD 10 mL/hr at 07/24/23 0928 New Bag at 07/24/23 1135   chlorhexidine (HIBICLENS) 4 % liquid 4 Application  60 mL Topical Once Maeola Harman, MD       And   chlorhexidine (HIBICLENS) 4 % liquid 4 Application  60 mL Topical Once Maeola Harman, MD         ROS:  See HPI  Physical Exam:  Today's Vitals   07/25/23 1243  BP: (!) 166/86  Pulse: 86  Resp: 16  Temp: 98.1 F (36.7 C)  TempSrc: Temporal  SpO2: 98%  Weight: 218 lb (98.9 kg)  Height: 5\' 2"  (1.575 m)   Body mass index is 39.87 kg/m.   Incision:  right antecubital and right axillary incisions look fine.  She does have some ecchymosis and fullness around the area that was tunneled for the graft.  Extremities:   There is a  palpable right radial pulse.  She has a brisk right ulnar and radial signal that does not really augment with compression of the graft.  She does have a palmar arch signal as well.  She does have some numbness of the right 1st and 2nd fingers and the thumb.  She is unable to make a complete fist.  There is a thrill/bruit present.  Access is  easily palpable    Assessment/Plan:  This is a 67 y.o. female who is s/p: new RUA AVG on 07/24/2023 by Dr. Edilia Bo.  She did have a right supraclavicular block.  -the pt was seen with Dr. Emeline Gins she has palpable right radial pulse and brisk doppler flow right radial and ulnar signals and does not augment with compression of the graft, he recommended that she continue to exercise her hand and if this does not improve or it gets worse, to contact us by Friday or Monday.  This could be residual effects of the supraclavicular block and may need another day to wear off.  She and her daughter are aware that she may require graft removal if this does not improve.  She will keep her appt 08/10/2023 or sooner if her sx do not improve.     Doreatha Massed, [facility] [phone]  Clinic MD:  pt seen with Dr. Edilia Bo

## 2023-07-25 NOTE — Telephone Encounter (Signed)
Caller: Patient  Concern: pain, cold hand, swelling & redness at distal incision, numbness of 1st three digits of hand, can't hold or pick up anything without manual manipulation of fingers  Location: right arm  Description: acute  Treatments: narcotic analgesics including oxycodone (Oxycontin, Oxyir) and elevation  Procedure: Dialysis Access Surgery  Consulted: McKenzi, PA  Resolution: Appointment scheduled for urgent appt  Next Appt: Appointment scheduled for 1245 with PA

## 2023-07-30 ENCOUNTER — Other Ambulatory Visit: Payer: Self-pay

## 2023-07-30 ENCOUNTER — Encounter (HOSPITAL_COMMUNITY): Payer: Self-pay | Admitting: Vascular Surgery

## 2023-07-30 ENCOUNTER — Telehealth: Payer: Self-pay

## 2023-07-30 DIAGNOSIS — T82898A Other specified complication of vascular prosthetic devices, implants and grafts, initial encounter: Secondary | ICD-10-CM

## 2023-07-30 NOTE — Telephone Encounter (Signed)
Pt called as directed to f/u on symptoms. Pt states that she still has tingling in the same 3 fingers. When she holds her had down, it throbs and tingles badly. She has redness around the horseshoe graft, although she felt that Dr. Edilia Bo told her it was bruising. She still can't grip, brush her hair, write, etc d/t being right-handed.  Reviewed pt's chart, returned call for clarification, no answer, lf vm.  Spoke to [place], [place] who advised reaching out to Dr. Edilia Bo for advisement on POC and potential surgery.

## 2023-07-31 ENCOUNTER — Other Ambulatory Visit: Payer: Self-pay

## 2023-07-31 ENCOUNTER — Ambulatory Visit (HOSPITAL_BASED_OUTPATIENT_CLINIC_OR_DEPARTMENT_OTHER): Payer: Medicare Other

## 2023-07-31 ENCOUNTER — Encounter (HOSPITAL_COMMUNITY): Payer: Self-pay | Admitting: Vascular Surgery

## 2023-07-31 ENCOUNTER — Ambulatory Visit (HOSPITAL_COMMUNITY): Payer: Medicare Other

## 2023-07-31 ENCOUNTER — Ambulatory Visit (HOSPITAL_COMMUNITY)
Admission: RE | Admit: 2023-07-31 | Discharge: 2023-07-31 | Disposition: A | Discharge status: Home / Self Care | Payer: Medicare Other | Attending: Vascular Surgery | Admitting: Vascular Surgery

## 2023-07-31 ENCOUNTER — Encounter (HOSPITAL_COMMUNITY)
Admission: RE | Disposition: A | Discharge status: Home / Self Care | Payer: Self-pay | Source: Home / Self Care | Attending: Vascular Surgery

## 2023-07-31 DIAGNOSIS — X58XXXA Exposure to other specified factors, initial encounter: Secondary | ICD-10-CM | POA: Diagnosis not present

## 2023-07-31 DIAGNOSIS — I120 Hypertensive chronic kidney disease with stage 5 chronic kidney disease or end stage renal disease: Secondary | ICD-10-CM | POA: Diagnosis not present

## 2023-07-31 DIAGNOSIS — T82898A Other specified complication of vascular prosthetic devices, implants and grafts, initial encounter: Secondary | ICD-10-CM | POA: Insufficient documentation

## 2023-07-31 DIAGNOSIS — G458 Other transient cerebral ischemic attacks and related syndromes: Secondary | ICD-10-CM

## 2023-07-31 DIAGNOSIS — N185 Chronic kidney disease, stage 5: Secondary | ICD-10-CM

## 2023-07-31 DIAGNOSIS — N186 End stage renal disease: Secondary | ICD-10-CM | POA: Diagnosis not present

## 2023-07-31 DIAGNOSIS — T82590D Other mechanical complication of surgically created arteriovenous fistula, subsequent encounter: Secondary | ICD-10-CM | POA: Diagnosis not present

## 2023-07-31 DIAGNOSIS — Z992 Dependence on renal dialysis: Secondary | ICD-10-CM | POA: Insufficient documentation

## 2023-07-31 HISTORY — PX: AVGG REMOVAL: SHX5153

## 2023-07-31 LAB — POCT I-STAT, CHEM 8
BUN: 38 mg/dL — ABNORMAL HIGH (ref 8–23)
Calcium, Ion: 1.16 mmol/L (ref 1.15–1.40)
Chloride: 98 mmol/L (ref 98–111)
Creatinine, Ser: 5.7 mg/dL — ABNORMAL HIGH (ref 0.44–1.00)
Glucose, Bld: 78 mg/dL (ref 70–99)
HCT: 29 % — ABNORMAL LOW (ref 36.0–46.0)
Hemoglobin: 9.9 g/dL — ABNORMAL LOW (ref 12.0–15.0)
Potassium: 4.7 mmol/L (ref 3.5–5.1)
Sodium: 137 mmol/L (ref 135–145)
TCO2: 31 mmol/L (ref 22–32)

## 2023-07-31 SURGERY — REMOVAL OF ARTERIOVENOUS GORETEX GRAFT (AVGG)
Anesthesia: Monitor Anesthesia Care | Site: Arm Upper | Laterality: Right

## 2023-07-31 MED ORDER — HEPARIN 6000 UNIT IRRIGATION SOLUTION: Status: DC | PRN

## 2023-07-31 MED ORDER — SODIUM CHLORIDE 0.9 % IV SOLN: INTRAVENOUS | Status: DC

## 2023-07-31 MED ORDER — CHLORHEXIDINE GLUCONATE 0.12 % MT SOLN
OROMUCOSAL | Status: AC
  Administered 2023-07-31: 15 mL via OROMUCOSAL
  Filled 2023-07-31: qty 15

## 2023-07-31 MED ORDER — VANCOMYCIN HCL IN DEXTROSE 1-5 GM/200ML-% IV SOLN: 1000.0000 mg | INTRAVENOUS | Status: AC

## 2023-07-31 MED ORDER — CHLORHEXIDINE GLUCONATE 4 % EX SOLN: 60.0000 mL | Freq: Once | CUTANEOUS | Status: DC

## 2023-07-31 MED ORDER — ROCURONIUM BROMIDE 10 MG/ML (PF) SYRINGE
PREFILLED_SYRINGE | INTRAVENOUS | Status: DC | PRN
  Administered 2023-07-31: 50 mg via INTRAVENOUS

## 2023-07-31 MED ORDER — FENTANYL CITRATE (PF) 100 MCG/2ML IJ SOLN
INTRAMUSCULAR | Status: AC
  Filled 2023-07-31: qty 2

## 2023-07-31 MED ORDER — OXYCODONE HCL 5 MG PO TABS: 5.0000 mg | ORAL_TABLET | Freq: Four times a day (QID) | ORAL | 0 refills | Status: DC | PRN

## 2023-07-31 MED ORDER — DEXAMETHASONE SODIUM PHOSPHATE 10 MG/ML IJ SOLN
INTRAMUSCULAR | Status: DC | PRN
  Administered 2023-07-31: 10 mg via INTRAVENOUS

## 2023-07-31 MED ORDER — PROPOFOL 10 MG/ML IV BOLUS
INTRAVENOUS | Status: DC | PRN
Start: 2023-07-31 — End: 2023-07-31
  Administered 2023-07-31: 100 mg via INTRAVENOUS

## 2023-07-31 MED ORDER — ONDANSETRON HCL 4 MG/2ML IJ SOLN
INTRAMUSCULAR | Status: AC
  Filled 2023-07-31: qty 4

## 2023-07-31 MED ORDER — FENTANYL CITRATE (PF) 250 MCG/5ML IJ SOLN
INTRAMUSCULAR | Status: AC
  Filled 2023-07-31: qty 5

## 2023-07-31 MED ORDER — FENTANYL CITRATE (PF) 250 MCG/5ML IJ SOLN
INTRAMUSCULAR | Status: DC | PRN
  Administered 2023-07-31: 75 ug via INTRAVENOUS

## 2023-07-31 MED ORDER — VANCOMYCIN HCL IN DEXTROSE 1-5 GM/200ML-% IV SOLN
INTRAVENOUS | Status: AC
  Administered 2023-07-31: 1000 mg via INTRAVENOUS
  Filled 2023-07-31: qty 200

## 2023-07-31 MED ORDER — DEXAMETHASONE SODIUM PHOSPHATE 10 MG/ML IJ SOLN
INTRAMUSCULAR | Status: AC
  Filled 2023-07-31: qty 2

## 2023-07-31 MED ORDER — OXYCODONE HCL 5 MG PO TABS
ORAL_TABLET | ORAL | Status: AC
  Filled 2023-07-31: qty 1

## 2023-07-31 MED ORDER — MIDAZOLAM HCL 2 MG/2ML IJ SOLN
INTRAMUSCULAR | Status: AC
  Filled 2023-07-31: qty 2

## 2023-07-31 MED ORDER — ACETAMINOPHEN 10 MG/ML IV SOLN: 1000.0000 mg | Freq: Once | INTRAVENOUS | Status: DC | PRN

## 2023-07-31 MED ORDER — LABETALOL HCL 5 MG/ML IV SOLN
INTRAVENOUS | Status: DC | PRN
  Administered 2023-07-31: 5 mg via INTRAVENOUS

## 2023-07-31 MED ORDER — MIDAZOLAM HCL 2 MG/2ML IJ SOLN
INTRAMUSCULAR | Status: DC | PRN
  Administered 2023-07-31: 1 mg via INTRAVENOUS

## 2023-07-31 MED ORDER — OXYCODONE HCL 5 MG/5ML PO SOLN: 5.0000 mg | Freq: Once | ORAL | Status: AC | PRN

## 2023-07-31 MED ORDER — HYDROMORPHONE HCL 1 MG/ML IJ SOLN
INTRAMUSCULAR | Status: AC
  Filled 2023-07-31: qty 1

## 2023-07-31 MED ORDER — CHLORHEXIDINE GLUCONATE 0.12 % MT SOLN
15.0000 mL | OROMUCOSAL | Status: AC
  Filled 2023-07-31: qty 15

## 2023-07-31 MED ORDER — PHENYLEPHRINE 80 MCG/ML (10ML) SYRINGE FOR IV PUSH (FOR BLOOD PRESSURE SUPPORT)
PREFILLED_SYRINGE | INTRAVENOUS | Status: AC
  Filled 2023-07-31: qty 20

## 2023-07-31 MED ORDER — SUGAMMADEX SODIUM 200 MG/2ML IV SOLN
INTRAVENOUS | Status: DC | PRN
  Administered 2023-07-31: 400 mg via INTRAVENOUS

## 2023-07-31 MED ORDER — LIDOCAINE 2% (20 MG/ML) 5 ML SYRINGE
INTRAMUSCULAR | Status: DC | PRN
  Administered 2023-07-31: 60 mg via INTRAVENOUS

## 2023-07-31 MED ORDER — PROPOFOL 500 MG/50ML IV EMUL
INTRAVENOUS | Status: DC | PRN
Start: 2023-07-31 — End: 2023-07-31
  Administered 2023-07-31: 50 ug/kg/min via INTRAVENOUS

## 2023-07-31 MED ORDER — PROPOFOL 10 MG/ML IV BOLUS
INTRAVENOUS | Status: AC
  Filled 2023-07-31: qty 20

## 2023-07-31 MED ORDER — OXYCODONE HCL 5 MG PO TABS
5.0000 mg | ORAL_TABLET | Freq: Four times a day (QID) | ORAL | Status: DC | PRN
Start: 2023-07-31 — End: 2023-07-31

## 2023-07-31 MED ORDER — OXYCODONE HCL 5 MG PO TABS
5.0000 mg | ORAL_TABLET | Freq: Once | ORAL | Status: AC | PRN
  Administered 2023-07-31: 5 mg via ORAL

## 2023-07-31 MED ORDER — FENTANYL CITRATE (PF) 100 MCG/2ML IJ SOLN
25.0000 ug | INTRAMUSCULAR | Status: DC | PRN
  Administered 2023-07-31 (×2): 50 ug via INTRAVENOUS

## 2023-07-31 MED ORDER — PROPOFOL 1000 MG/100ML IV EMUL
INTRAVENOUS | Status: AC
  Filled 2023-07-31: qty 100

## 2023-07-31 MED ORDER — DROPERIDOL 2.5 MG/ML IJ SOLN: 0.6250 mg | Freq: Once | INTRAMUSCULAR | Status: DC | PRN

## 2023-07-31 MED ORDER — ONDANSETRON HCL 4 MG/2ML IJ SOLN
INTRAMUSCULAR | Status: DC | PRN
  Administered 2023-07-31: 4 mg via INTRAVENOUS

## 2023-07-31 MED ORDER — 0.9 % SODIUM CHLORIDE (POUR BTL) OPTIME
TOPICAL | Status: DC | PRN
  Administered 2023-07-31: 1000 mL

## 2023-07-31 MED ORDER — HYDROMORPHONE HCL 1 MG/ML IJ SOLN
INTRAMUSCULAR | Status: DC | PRN
Start: 2023-07-31 — End: 2023-07-31
  Administered 2023-07-31: .5 mg via INTRAVENOUS

## 2023-07-31 SURGICAL SUPPLY — 29 items
ADH SKN CLS APL DERMABOND .7 (GAUZE/BANDAGES/DRESSINGS) ×1
BAG COUNTER SPONGE SURGICOUNT (BAG) ×1 IMPLANT
BAG SPNG CNTER NS LX DISP (BAG) ×1
CANISTER SUCT 3000ML PPV (MISCELLANEOUS) ×1 IMPLANT
CLIP TI MEDIUM 6 (CLIP) ×1 IMPLANT
CLIP TI WIDE RED SMALL 6 (CLIP) ×1 IMPLANT
DERMABOND ADVANCED .7 DNX12 (GAUZE/BANDAGES/DRESSINGS) ×1 IMPLANT
ELECT REM PT RETURN 9FT ADLT (ELECTROSURGICAL) ×1
ELECTRODE REM PT RTRN 9FT ADLT (ELECTROSURGICAL) ×1 IMPLANT
GLOVE BIO SURGEON STRL SZ7.5 (GLOVE) ×1 IMPLANT
GLOVE BIOGEL PI IND STRL 8 (GLOVE) ×1 IMPLANT
GLOVE SURG POLY ORTHO LF SZ7.5 (GLOVE) IMPLANT
GLOVE SURG UNDER LTX SZ8 (GLOVE) ×1 IMPLANT
GOWN STRL REUS W/ TWL LRG LVL3 (GOWN DISPOSABLE) ×3 IMPLANT
GOWN STRL REUS W/TWL LRG LVL3 (GOWN DISPOSABLE) ×3
KIT BASIN OR (CUSTOM PROCEDURE TRAY) ×1 IMPLANT
KIT TURNOVER KIT B (KITS) ×1 IMPLANT
NS IRRIG 1000ML POUR BTL (IV SOLUTION) ×1 IMPLANT
PACK CV ACCESS (CUSTOM PROCEDURE TRAY) ×1 IMPLANT
PAD ARMBOARD 7.5X6 YLW CONV (MISCELLANEOUS) ×2 IMPLANT
SUT MNCRL AB 4-0 PS2 18 (SUTURE) IMPLANT
SUT PROLENE 4 0 RB 1 (SUTURE) ×2
SUT PROLENE 4-0 RB1 18X2 ARM (SUTURE) IMPLANT
SUT PROLENE 6 0 BV (SUTURE) ×1 IMPLANT
SUT VIC AB 3-0 SH 27 (SUTURE) ×2
SUT VIC AB 3-0 SH 27X BRD (SUTURE) ×1 IMPLANT
TOWEL GREEN STERILE (TOWEL DISPOSABLE) ×1 IMPLANT
UNDERPAD 30X36 HEAVY ABSORB (UNDERPADS AND DIAPERS) ×1 IMPLANT
WATER STERILE IRR 1000ML POUR (IV SOLUTION) ×1 IMPLANT

## 2023-07-31 NOTE — Anesthesia Procedure Notes (Signed)
Procedure Name: Intubation Date/Time: 07/31/2023 1:25 PM  Performed by: Alease Medina, CRNAPre-anesthesia Checklist: Patient identified, Emergency Drugs available, Suction available and Patient being monitored Patient Re-evaluated:Patient Re-evaluated prior to induction Oxygen Delivery Method: Circle system utilized Preoxygenation: Pre-oxygenation with 100% oxygen Induction Type: IV induction Ventilation: Mask ventilation without difficulty Laryngoscope Size: Mac and 3 Grade View: Grade I Tube type: Oral Number of attempts: 1 Airway Equipment and Method: Stylet and Oral airway Placement Confirmation: ETT inserted through vocal cords under direct vision, positive ETCO2 and breath sounds checked- equal and bilateral Secured at: 22 cm Tube secured with: Tape Dental Injury: Teeth and Oropharynx as per pre-operative assessment

## 2023-07-31 NOTE — Anesthesia Postprocedure Evaluation (Signed)
Anesthesia Post Note  Patient: Margrette Zarzecki  Procedure(s) Performed: REMOVAL OF RIGHT ARM ARTERIOVENOUS GORETEX GRAFT (AVGG) (Right: Arm Upper)     Patient location during evaluation: PACU Anesthesia Type: MAC Level of consciousness: awake and alert Pain management: pain level controlled Vital Signs Assessment: post-procedure vital signs reviewed and stable Respiratory status: spontaneous breathing, nonlabored ventilation, respiratory function stable and patient connected to nasal cannula oxygen Cardiovascular status: blood pressure returned to baseline and stable Postop Assessment: no apparent nausea or vomiting Anesthetic complications: no   No notable events documented.  Last Vitals:  Vitals:   07/31/23 1415 07/31/23 1430  BP: (!) 162/77 (!) 160/81  Pulse: 82 74  Resp: 15 15  Temp: 36.9 C   SpO2: 99% 100%    Last Pain:  Vitals:   07/31/23 1430  TempSrc:   PainSc: 8                  Troy Nation

## 2023-07-31 NOTE — Interval H&P Note (Signed)
Patient seen and examined in preop holding.  No complaints. No changes to medication history or physical exam since last seen in clinic. After discussing the risks and benefits of R arm AVG excision, Christina Vasquez elected to proceed.   Daria Pastures MD

## 2023-07-31 NOTE — Transfer of Care (Signed)
Immediate Anesthesia Transfer of Care Note  Patient: Christina Vasquez  Procedure(s) Performed: REMOVAL OF RIGHT ARM ARTERIOVENOUS GORETEX GRAFT (AVGG) (Right: Arm Upper)  Patient Location: PACU  Anesthesia Type:General  Level of Consciousness: drowsy and patient cooperative  Airway & Oxygen Therapy: Patient Spontanous Breathing and Patient connected to face mask oxygen  Post-op Assessment: Report given to RN, Post -op Vital signs reviewed and stable, and Patient moving all extremities X 4  Post vital signs: Reviewed and stable  Last Vitals:  Vitals Value Taken Time  BP 179/89 07/31/23 1407  Temp    Pulse 77 07/31/23 1412  Resp 25 07/31/23 1412  SpO2 100 % 07/31/23 1412  Vitals shown include unfiled device data.  Last Pain:  Vitals:   07/31/23 1159  TempSrc: Oral  PainSc: 0-No pain      Patients Stated Pain Goal: 0 (07/31/23 1159)  Complications: No notable events documented.

## 2023-07-31 NOTE — Op Note (Signed)
NAME: Christina Vasquez    MRN: [id number] DOB: August 16, 1956    DATE OF OPERATION: 07/31/2023  PREOP DIAGNOSIS:    Recent R AVG placement with steal symptoms  POSTOP DIAGNOSIS:    Same  PROCEDURE:    Right AVG excision  SURGEON: Daria Pastures  ASSIST: none  ANESTHESIA: general/ETT   EBL: minimal  INDICATIONS:    Christina Vasquez is a 67 y.o. female who is 1 week status post right brachial axillary AV graft creation.  She presents to the office shortly after due to significant pain and numbness in her right hand and fingers.  She was unable to hold a pencil or fork and was concerned.  An arterial duplex of the right upper extremity was obtained which demonstrated triphasic waveforms throughout the brachial, radial and ulnar arteries, although her symptoms persisted.  Risks and benefits were reviewed for AV graft excision and she elected to proceed.  FINDINGS:   Well incorporated right brachial axillary AVG.  No purulence  TECHNIQUE:   The patient was brought to the operating room positioned supine on the operating table.  General anesthesia was administered.  IV vancomycin was given preoperatively.  The right arm was prepped and draped in usual sterile fashion.  A timeout was performed and no concerns were expressed. The 2 previous incisions were reopened and the graft was easily identified and dissected circumferentially for about 2 cm from each anastomosis. The graft was clamped leaving each anastomosis intact and transected just beyond both clamp sites. The intervening segment was easily removed from the tunnel.  Both ends were oversewn with 4-0 Prolene in a [place] fashion and the clamps were removed.  Hemostasis was excellent.  Both wounds were copiously irrigated and closed in layers with 3-0 Vicryl and 4-0 Monocryl for the skin. Dermabond was applied to the incisions and the arm was Ace wrapped.  The patient tolerated the procedure well was brought to PACU in stable  condition.   Daria Pastures, MD [facility] DATE OF DICTATION:   07/31/2023

## 2023-07-31 NOTE — Anesthesia Preprocedure Evaluation (Addendum)
Anesthesia Evaluation  Patient identified by MRN, date of birth, ID band Patient awake    Reviewed: Allergy & Precautions, H&P , NPO status , Patient's Chart, lab work & pertinent test results  History of Anesthesia Complications (+) PONV and history of anesthetic complications  Airway Mallampati: II  TM Distance: >3 FB Neck ROM: Full    Dental no notable dental hx.    Pulmonary sleep apnea (no cpap)    Pulmonary exam normal breath sounds clear to auscultation       Cardiovascular hypertension, Pt. on medications and Pt. on home beta blockers Normal cardiovascular exam Rhythm:Regular Rate:Normal     Neuro/Psych  PSYCHIATRIC DISORDERS Anxiety Depression    negative neurological ROS     GI/Hepatic negative GI ROS, Neg liver ROS,  Medicated,,  Endo/Other  negative endocrine ROS    Renal/GU ESRF and Dialysisnegative Renal ROS  negative genitourinary   Musculoskeletal  (+) Arthritis , Osteoarthritis,    Abdominal   Peds negative pediatric ROS (+)  Hematology  (+) Blood dyscrasia, anemia Hb 9.9   Anesthesia Other Findings   Reproductive/Obstetrics negative OB ROS                             Anesthesia Physical Anesthesia Plan  ASA: 3  Anesthesia Plan: MAC   Post-op Pain Management: Regional block*   Induction: Intravenous  PONV Risk Score and Plan: 3 and Propofol infusion, Ondansetron and Treatment may vary due to age or medical condition  Airway Management Planned: Simple Face Mask  Additional Equipment:   Intra-op Plan:   Post-operative Plan:   Informed Consent: I have reviewed the patients History and Physical, chart, labs and discussed the procedure including the risks, benefits and alternatives for the proposed anesthesia with the patient or authorized representative who has indicated his/her understanding and acceptance.     Dental advisory given  Plan Discussed with:  CRNA and Surgeon  Anesthesia Plan Comments:        Anesthesia Quick Evaluation

## 2023-08-01 ENCOUNTER — Encounter (HOSPITAL_COMMUNITY): Payer: Self-pay | Admitting: Vascular Surgery

## 2023-08-01 NOTE — Addendum Note (Signed)
Addendum  created 08/01/23 1323 by [place], MD   Clinical Note Signed

## 2023-08-03 ENCOUNTER — Telehealth: Payer: Self-pay

## 2023-08-03 NOTE — Telephone Encounter (Signed)
Pt called to let us know she is still having numbness in her fingers to elbow. She is able to use her fingers but unable to make a fist. Per MD, he discussed this with her and that the surgery may not improve this feeling immediately. Explained to her this can take time due to inflammation around the nerve, per MD. Pt has f/u in 2 weeks and is aware of this.

## 2023-08-06 ENCOUNTER — Ambulatory Visit (HOSPITAL_COMMUNITY): Payer: Medicare Other

## 2023-08-06 ENCOUNTER — Encounter (HOSPITAL_COMMUNITY): Payer: Medicare Other

## 2023-08-06 ENCOUNTER — Other Ambulatory Visit: Payer: Self-pay

## 2023-08-06 ENCOUNTER — Emergency Department (HOSPITAL_COMMUNITY): Payer: Medicare Other

## 2023-08-06 ENCOUNTER — Inpatient Hospital Stay (HOSPITAL_COMMUNITY)
Admission: EM | Admit: 2023-08-06 | Discharge: 2023-08-09 | DRG: 070 | Disposition: A | Discharge status: Home / Self Care | Payer: Medicare Other | Attending: Internal Medicine | Admitting: Internal Medicine

## 2023-08-06 DIAGNOSIS — R609 Edema, unspecified: Secondary | ICD-10-CM | POA: Diagnosis not present

## 2023-08-06 DIAGNOSIS — Z90710 Acquired absence of both cervix and uterus: Secondary | ICD-10-CM | POA: Diagnosis not present

## 2023-08-06 DIAGNOSIS — G4733 Obstructive sleep apnea (adult) (pediatric): Secondary | ICD-10-CM | POA: Diagnosis present

## 2023-08-06 DIAGNOSIS — Z96651 Presence of right artificial knee joint: Secondary | ICD-10-CM | POA: Diagnosis present

## 2023-08-06 DIAGNOSIS — Z992 Dependence on renal dialysis: Secondary | ICD-10-CM

## 2023-08-06 DIAGNOSIS — Z96611 Presence of right artificial shoulder joint: Secondary | ICD-10-CM | POA: Diagnosis present

## 2023-08-06 DIAGNOSIS — Z883 Allergy status to other anti-infective agents status: Secondary | ICD-10-CM

## 2023-08-06 DIAGNOSIS — R4702 Dysphasia: Secondary | ICD-10-CM | POA: Diagnosis present

## 2023-08-06 DIAGNOSIS — D631 Anemia in chronic kidney disease: Secondary | ICD-10-CM | POA: Diagnosis present

## 2023-08-06 DIAGNOSIS — E162 Hypoglycemia, unspecified: Secondary | ICD-10-CM | POA: Diagnosis present

## 2023-08-06 DIAGNOSIS — Z6839 Body mass index (BMI) 39.0-39.9, adult: Secondary | ICD-10-CM

## 2023-08-06 DIAGNOSIS — I82611 Acute embolism and thrombosis of superficial veins of right upper extremity: Secondary | ICD-10-CM | POA: Diagnosis present

## 2023-08-06 DIAGNOSIS — R4701 Aphasia: Secondary | ICD-10-CM | POA: Diagnosis present

## 2023-08-06 DIAGNOSIS — Z96612 Presence of left artificial shoulder joint: Secondary | ICD-10-CM | POA: Diagnosis present

## 2023-08-06 DIAGNOSIS — Z881 Allergy status to other antibiotic agents status: Secondary | ICD-10-CM

## 2023-08-06 DIAGNOSIS — I161 Hypertensive emergency: Secondary | ICD-10-CM | POA: Diagnosis present

## 2023-08-06 DIAGNOSIS — Z9049 Acquired absence of other specified parts of digestive tract: Secondary | ICD-10-CM

## 2023-08-06 DIAGNOSIS — Z803 Family history of malignant neoplasm of breast: Secondary | ICD-10-CM | POA: Diagnosis not present

## 2023-08-06 DIAGNOSIS — I82629 Acute embolism and thrombosis of deep veins of unspecified upper extremity: Secondary | ICD-10-CM

## 2023-08-06 DIAGNOSIS — Z7985 Long-term (current) use of injectable non-insulin antidiabetic drugs: Secondary | ICD-10-CM

## 2023-08-06 DIAGNOSIS — I6783 Posterior reversible encephalopathy syndrome: Principal | ICD-10-CM | POA: Diagnosis present

## 2023-08-06 DIAGNOSIS — I82B12 Acute embolism and thrombosis of left subclavian vein: Secondary | ICD-10-CM | POA: Diagnosis present

## 2023-08-06 DIAGNOSIS — Z79899 Other long term (current) drug therapy: Secondary | ICD-10-CM

## 2023-08-06 DIAGNOSIS — R079 Chest pain, unspecified: Secondary | ICD-10-CM | POA: Diagnosis not present

## 2023-08-06 DIAGNOSIS — I120 Hypertensive chronic kidney disease with stage 5 chronic kidney disease or end stage renal disease: Secondary | ICD-10-CM | POA: Diagnosis present

## 2023-08-06 DIAGNOSIS — R479 Unspecified speech disturbances: Secondary | ICD-10-CM

## 2023-08-06 DIAGNOSIS — Z86718 Personal history of other venous thrombosis and embolism: Secondary | ICD-10-CM

## 2023-08-06 DIAGNOSIS — I953 Hypotension of hemodialysis: Secondary | ICD-10-CM | POA: Diagnosis not present

## 2023-08-06 DIAGNOSIS — N186 End stage renal disease: Secondary | ICD-10-CM | POA: Diagnosis present

## 2023-08-06 DIAGNOSIS — R202 Paresthesia of skin: Secondary | ICD-10-CM | POA: Insufficient documentation

## 2023-08-06 DIAGNOSIS — E669 Obesity, unspecified: Secondary | ICD-10-CM | POA: Diagnosis present

## 2023-08-06 LAB — DIFFERENTIAL
Abs Immature Granulocytes: 0.02 10*3/uL (ref 0.00–0.07)
Basophils Absolute: 0 10*3/uL (ref 0.0–0.1)
Basophils Relative: 1 %
Eosinophils Absolute: 0.1 10*3/uL (ref 0.0–0.5)
Eosinophils Relative: 2 %
Immature Granulocytes: 0 %
Lymphocytes Relative: 15 %
Lymphs Abs: 0.9 10*3/uL (ref 0.7–4.0)
Monocytes Absolute: 0.3 10*3/uL (ref 0.1–1.0)
Monocytes Relative: 5 %
Neutro Abs: 4.7 10*3/uL (ref 1.7–7.7)
Neutrophils Relative %: 77 %

## 2023-08-06 LAB — COMPREHENSIVE METABOLIC PANEL
ALT: 15 U/L (ref 0–44)
AST: 16 U/L (ref 15–41)
Albumin: 3.5 g/dL (ref 3.5–5.0)
Alkaline Phosphatase: 73 U/L (ref 38–126)
Anion gap: 14 (ref 5–15)
BUN: 34 mg/dL — ABNORMAL HIGH (ref 8–23)
CO2: 24 mmol/L (ref 22–32)
Calcium: 8.8 mg/dL — ABNORMAL LOW (ref 8.9–10.3)
Chloride: 96 mmol/L — ABNORMAL LOW (ref 98–111)
Creatinine, Ser: 5.91 mg/dL — ABNORMAL HIGH (ref 0.44–1.00)
GFR, Estimated: 7 mL/min — ABNORMAL LOW (ref >60)
Glucose, Bld: 102 mg/dL — ABNORMAL HIGH (ref 70–99)
Potassium: 4.1 mmol/L (ref 3.5–5.1)
Sodium: 134 mmol/L — ABNORMAL LOW (ref 135–145)
Total Bilirubin: 0.6 mg/dL (ref 0.3–1.2)
Total Protein: 6.6 g/dL (ref 6.5–8.1)

## 2023-08-06 LAB — I-STAT CHEM 8, ED
BUN: 35 mg/dL — ABNORMAL HIGH (ref 8–23)
Calcium, Ion: 1.05 mmol/L — ABNORMAL LOW (ref 1.15–1.40)
Chloride: 102 mmol/L (ref 98–111)
Creatinine, Ser: 6.4 mg/dL — ABNORMAL HIGH (ref 0.44–1.00)
Glucose, Bld: 100 mg/dL — ABNORMAL HIGH (ref 70–99)
HCT: 32 % — ABNORMAL LOW (ref 36.0–46.0)
Hemoglobin: 10.9 g/dL — ABNORMAL LOW (ref 12.0–15.0)
Potassium: 4.3 mmol/L (ref 3.5–5.1)
Sodium: 138 mmol/L (ref 135–145)
TCO2: 26 mmol/L (ref 22–32)

## 2023-08-06 LAB — URINALYSIS, ROUTINE W REFLEX MICROSCOPIC
Bilirubin Urine: NEGATIVE
Glucose, UA: 50 mg/dL — AB
Hgb urine dipstick: NEGATIVE
Ketones, ur: NEGATIVE mg/dL
Leukocytes,Ua: NEGATIVE
Nitrite: NEGATIVE
Protein, ur: 100 mg/dL — AB
Specific Gravity, Urine: 1.008 (ref 1.005–1.030)
pH: 9 — ABNORMAL HIGH (ref 5.0–8.0)

## 2023-08-06 LAB — CBG MONITORING, ED
Glucose-Capillary: 76 mg/dL (ref 70–99)
Glucose-Capillary: 67 mg/dL — ABNORMAL LOW (ref 70–99)
Glucose-Capillary: 87 mg/dL (ref 70–99)
Glucose-Capillary: 101 mg/dL — ABNORMAL HIGH (ref 70–99)

## 2023-08-06 LAB — RAPID URINE DRUG SCREEN, HOSP PERFORMED
Amphetamines: NOT DETECTED
Barbiturates: NOT DETECTED
Benzodiazepines: NOT DETECTED
Cocaine: NOT DETECTED
Opiates: NOT DETECTED
Tetrahydrocannabinol: NOT DETECTED

## 2023-08-06 LAB — CBC
HCT: 32.8 % — ABNORMAL LOW (ref 36.0–46.0)
HCT: 32.3 % — ABNORMAL LOW (ref 36.0–46.0)
Hemoglobin: 10.5 g/dL — ABNORMAL LOW (ref 12.0–15.0)
Hemoglobin: 10.5 g/dL — ABNORMAL LOW (ref 12.0–15.0)
MCH: 25.8 pg — ABNORMAL LOW (ref 26.0–34.0)
MCH: 25.9 pg — ABNORMAL LOW (ref 26.0–34.0)
MCHC: 32 g/dL (ref 30.0–36.0)
MCHC: 32.5 g/dL (ref 30.0–36.0)
MCV: 80.6 fL (ref 80.0–100.0)
MCV: 79.6 fL — ABNORMAL LOW (ref 80.0–100.0)
Platelets: 137 10*3/uL — ABNORMAL LOW (ref 150–400)
Platelets: 171 10*3/uL (ref 150–400)
RBC: 4.07 MIL/uL (ref 3.87–5.11)
RBC: 4.06 MIL/uL (ref 3.87–5.11)
RDW: 15.5 % (ref 11.5–15.5)
RDW: 15.7 % — ABNORMAL HIGH (ref 11.5–15.5)
WBC: 6.1 10*3/uL (ref 4.0–10.5)
WBC: 6.9 10*3/uL (ref 4.0–10.5)
nRBC: 0 % (ref 0.0–0.2)
nRBC: 0 % (ref 0.0–0.2)

## 2023-08-06 LAB — CREATININE, SERUM
Creatinine, Ser: 6.28 mg/dL — ABNORMAL HIGH (ref 0.44–1.00)
GFR, Estimated: 7 mL/min — ABNORMAL LOW (ref >60)

## 2023-08-06 LAB — TROPONIN I (HIGH SENSITIVITY): Troponin I (High Sensitivity): 8 ng/L (ref <18)

## 2023-08-06 LAB — PROTIME-INR
INR: 1.1 (ref 0.8–1.2)
Prothrombin Time: 14.5 s (ref 11.4–15.2)

## 2023-08-06 LAB — HIV ANTIBODY (ROUTINE TESTING W REFLEX): HIV Screen 4th Generation wRfx: NONREACTIVE

## 2023-08-06 LAB — HEMOGLOBIN A1C
Hgb A1c MFr Bld: 5.2 % (ref 4.8–5.6)
Mean Plasma Glucose: 102.54 mg/dL

## 2023-08-06 LAB — ETHANOL: Alcohol, Ethyl (B): <10 mg/dL (ref <10)

## 2023-08-06 LAB — HEPATITIS B SURFACE ANTIGEN: Hepatitis B Surface Ag: NONREACTIVE

## 2023-08-06 LAB — APTT: aPTT: 75 s — ABNORMAL HIGH (ref 24–36)

## 2023-08-06 LAB — GLUCOSE, CAPILLARY: Glucose-Capillary: 139 mg/dL — ABNORMAL HIGH (ref 70–99)

## 2023-08-06 MED ORDER — LABETALOL HCL 5 MG/ML IV SOLN
20.0000 mg | INTRAVENOUS | Status: DC | PRN
  Administered 2023-08-06: 20 mg via INTRAVENOUS
  Filled 2023-08-06: qty 4

## 2023-08-06 MED ORDER — HEPARIN SODIUM (PORCINE) 5000 UNIT/ML IJ SOLN
5000.0000 [IU] | Freq: Three times a day (TID) | INTRAMUSCULAR | Status: DC
  Administered 2023-08-06 – 2023-08-08 (×6): 5000 [IU] via SUBCUTANEOUS
  Filled 2023-08-06 (×6): qty 1

## 2023-08-06 MED ORDER — SUCROFERRIC OXYHYDROXIDE 500 MG PO CHEW
1500.0000 mg | CHEWABLE_TABLET | Freq: Three times a day (TID) | ORAL | Status: DC
  Administered 2023-08-07 – 2023-08-09 (×4): 1500 mg via ORAL
  Filled 2023-08-06 (×10): qty 3

## 2023-08-06 MED ORDER — OXYCODONE HCL 5 MG PO TABS: 5.0000 mg | ORAL_TABLET | Freq: Four times a day (QID) | ORAL | Status: DC | PRN

## 2023-08-06 MED ORDER — CINACALCET HCL 30 MG PO TABS
60.0000 mg | ORAL_TABLET | ORAL | Status: DC
  Administered 2023-08-08: 60 mg via ORAL
  Filled 2023-08-06: qty 2

## 2023-08-06 MED ORDER — CLEVIDIPINE BUTYRATE 0.5 MG/ML IV EMUL
0.0000 mg/h | INTRAVENOUS | Status: DC
  Administered 2023-08-06 (×2): 10 mg/h via INTRAVENOUS
  Administered 2023-08-07: 6 mg/h via INTRAVENOUS
  Administered 2023-08-07: 10 mg/h via INTRAVENOUS
  Administered 2023-08-07 – 2023-08-08 (×2): 5 mg/h via INTRAVENOUS
  Filled 2023-08-06 (×5): qty 100

## 2023-08-06 MED ORDER — SODIUM CHLORIDE 0.9 % IV SOLN: 250.0000 mL | INTRAVENOUS | Status: DC | PRN

## 2023-08-06 MED ORDER — METOPROLOL TARTRATE 50 MG PO TABS
100.0000 mg | ORAL_TABLET | Freq: Two times a day (BID) | ORAL | Status: DC
  Administered 2023-08-06 – 2023-08-07 (×3): 100 mg via ORAL
  Filled 2023-08-06 (×3): qty 2

## 2023-08-06 MED ORDER — CLEVIDIPINE BUTYRATE 0.5 MG/ML IV EMUL
0.0000 mg/h | INTRAVENOUS | Status: DC
  Administered 2023-08-06: 1 mg/h via INTRAVENOUS
  Filled 2023-08-06: qty 100

## 2023-08-06 MED ORDER — SODIUM CHLORIDE 0.9% FLUSH: 3.0000 mL | INTRAVENOUS | Status: DC | PRN

## 2023-08-06 MED ORDER — CHLORHEXIDINE GLUCONATE CLOTH 2 % EX PADS
6.0000 | MEDICATED_PAD | Freq: Every day | CUTANEOUS | Status: DC
  Administered 2023-08-07: 6 via TOPICAL

## 2023-08-06 MED ORDER — FOLIC ACID 1 MG PO TABS
1.0000 mg | ORAL_TABLET | Freq: Every day | ORAL | Status: DC
  Administered 2023-08-06 – 2023-08-09 (×4): 1 mg via ORAL
  Filled 2023-08-06 (×4): qty 1

## 2023-08-06 MED ORDER — AMLODIPINE BESYLATE 10 MG PO TABS
10.0000 mg | ORAL_TABLET | Freq: Every day | ORAL | Status: DC
  Administered 2023-08-06 – 2023-08-08 (×3): 10 mg via ORAL
  Filled 2023-08-06: qty 2
  Filled 2023-08-06 (×2): qty 1

## 2023-08-06 MED ORDER — GLUCOSE 40 % PO GEL
1.0000 | Freq: Once | ORAL | Status: AC
  Administered 2023-08-06: 31 g via ORAL

## 2023-08-06 MED ORDER — LABETALOL HCL 5 MG/ML IV SOLN
20.0000 mg | INTRAVENOUS | Status: DC | PRN
Start: 2023-08-06 — End: 2023-08-06

## 2023-08-06 MED ORDER — VENLAFAXINE HCL ER 37.5 MG PO CP24
37.5000 mg | ORAL_CAPSULE | Freq: Every day | ORAL | Status: DC
  Administered 2023-08-07 – 2023-08-09 (×3): 37.5 mg via ORAL
  Filled 2023-08-06 (×5): qty 1

## 2023-08-06 MED ORDER — HYDRALAZINE HCL 20 MG/ML IJ SOLN
20.0000 mg | INTRAMUSCULAR | Status: DC | PRN
  Administered 2023-08-06 – 2023-08-08 (×3): 20 mg via INTRAVENOUS
  Filled 2023-08-06 (×3): qty 1

## 2023-08-06 MED ORDER — LABETALOL HCL 5 MG/ML IV SOLN
10.0000 mg | Freq: Once | INTRAVENOUS | Status: AC
  Administered 2023-08-06: 10 mg via INTRAVENOUS
  Filled 2023-08-06: qty 4

## 2023-08-06 MED ORDER — SODIUM CHLORIDE 0.9% FLUSH
3.0000 mL | Freq: Two times a day (BID) | INTRAVENOUS | Status: DC
  Administered 2023-08-06 – 2023-08-08 (×5): 3 mL via INTRAVENOUS

## 2023-08-06 NOTE — ED Notes (Signed)
Pt SBP 200 and maxed out of IV BP medications. Dr. Rush Landmark made aware. Per Dr. Rush Landmark defer to hospitalist team who should be coming to bedside soon and speak with them about further plans for BP management.

## 2023-08-06 NOTE — ED Provider Triage Note (Signed)
Emergency Medicine Provider Triage Evaluation Note  Christina Vasquez , a 67 y.o. female  was evaluated in triage.  Pt complains of right arm numbness.  However, she was found to be acutely confused upon triage.  The paramedic that brought her in noted this is completely new and she was last known well as they brought her into the ER around 8 AM.  Patient currently answers questions inappropriately and seems acutely confused.  Review of Systems  Positive: Confusion, right arm discomfort Negative: Headache  Physical Exam  BP (!) 176/123 (BP Location: Left Wrist)   Pulse 71   Temp 98.2 F (36.8 C) (Oral)   Resp (!) 22   SpO2 100%  Gen:   Awake, no distress   Resp:  Normal effort  Neuro:  Acutely disoriented and confused.  She weakly moves all 4 extremities.  When asked about date or location, she answers a different question that was not asked  Medical Decision Making  Medically screening exam initiated at 8:32 AM.  Appropriate orders placed.  Zakiah Gauthreaux was informed that the remainder of the evaluation will be completed by another provider, this initial triage assessment does not replace that evaluation, and the importance of remaining in the ED until their evaluation is complete.  Patient is acutely confused and was last known well around 8 AM with the paramedic who was talking to her about her birthday and other current events.  However now the patient is acutely confused and altered.  Glucose in 70s.  Will call code stroke.   Pricilla Loveless, MD 08/06/23 [phone]

## 2023-08-06 NOTE — ED Provider Notes (Signed)
[place] EMERGENCY DEPARTMENT AT [facility] Provider Note   CSN: [id number] Arrival date & time: 08/06/23  4098     History  Chief Complaint  Patient presents with   Vascular Access Problem   Chest Pain   Shortness of Breath   Numbness   Code Stroke    Christina Vasquez is a 67 y.o. female.  The history is provided by the patient and medical records. No language interpreter was used.  Shortness of Breath Associated symptoms: no abdominal pain, no chest pain (resdlved), no cough, no fever, no headaches, no neck pain, no rash, no vomiting and no wheezing   Neurologic Problem This is a new problem. The current episode started less than 1 hour ago. Associated symptoms include shortness of breath. Pertinent negatives include no chest pain (resdlved), no abdominal pain and no headaches. Nothing aggravates the symptoms. Nothing relieves the symptoms. She has tried nothing for the symptoms. The treatment provided no relief.       Home Medications Prior to Admission medications   Medication Sig Start Date End Date Taking? Authorizing Provider  acetaminophen (TYLENOL) 650 MG CR tablet Take 1,300 mg by mouth every 8 (eight) hours as needed for pain.    [provider]  amLODipine (NORVASC) 10 MG tablet Take 10 mg by mouth daily.    [provider]  cinacalcet (SENSIPAR) 60 MG tablet Take 60 mg by mouth every Monday, Wednesday, and Friday with hemodialysis.    [provider]  folic acid (FOLVITE) 1 MG tablet Take 1 tablet (1 mg total) by mouth daily. 07/12/23   Jaci Standard, MD  metoprolol succinate (TOPROL-XL) 50 MG 24 hr tablet Take 50 mg by mouth at bedtime. Take with or immediately following a meal.    [provider]  omeprazole (PRILOSEC) 40 MG capsule Take 40 mg by mouth daily.    [provider]  oxyCODONE (OXY IR/ROXICODONE) 5 MG immediate release tablet Take 1 tablet (5 mg total) by mouth every 6 (six) hours as needed  for severe pain. 07/31/23   Daria Pastures, MD  oxyCODONE (ROXICODONE) 5 MG immediate release tablet Take 1 tablet (5 mg total) by mouth every 6 (six) hours as needed for severe pain. 07/24/23   Schuh, McKenzi P, PA-C  sucroferric oxyhydroxide (VELPHORO) 500 MG chewable tablet Chew 1,500 mg by mouth 3 (three) times daily with meals.    [provider]  venlafaxine XR (EFFEXOR-XR) 37.5 MG 24 hr capsule Take 37.5 mg by mouth daily with breakfast.    [provider]  VITAMIN D PO Take 4 capsules by mouth every Monday, Wednesday, and Friday with hemodialysis.    [provider]      Allergies    Meloxicam, Augmentin [amoxicillin-pot clavulanate], and Tetracyclines & related    Review of Systems   Review of Systems  Constitutional:  Negative for chills, fatigue and fever.  HENT:  Negative for congestion.   Respiratory:  Positive for shortness of breath. Negative for cough, chest tightness and wheezing.   Cardiovascular:  Negative for chest pain (resdlved) and palpitations.  Gastrointestinal:  Negative for abdominal pain, constipation, diarrhea, nausea and vomiting.  Genitourinary:  Negative for dysuria and flank pain.  Musculoskeletal:  Negative for back pain and neck pain.  Skin:  Negative for rash.  Neurological:  Positive for speech difficulty. Negative for weakness, light-headedness, numbness (trdolved) and headaches.  Psychiatric/Behavioral:  Negative for agitation.     Physical Exam Updated Vital  Signs BP (!) 176/123 (BP Location: Left Wrist)   Pulse 71   Temp 98.2 F (36.8 C) (Oral)   Resp (!) 22   SpO2 100%  Physical Exam Vitals and nursing note reviewed.  Constitutional:      General: She is not in acute distress.    Appearance: She is well-developed. She is not ill-appearing, toxic-appearing or diaphoretic.  HENT:     Head: Normocephalic and atraumatic.  Eyes:     Conjunctiva/sclera: Conjunctivae normal.     Pupils: Pupils are equal, round, and  reactive to light.  Cardiovascular:     Rate and Rhythm: Normal rate and regular rhythm.     Heart sounds: No murmur heard. Pulmonary:     Effort: Pulmonary effort is normal. No respiratory distress.     Breath sounds: Normal breath sounds.  Chest:     Chest wall: No tenderness.  Abdominal:     Palpations: Abdomen is soft.     Tenderness: There is no abdominal tenderness.  Musculoskeletal:        General: No swelling.     Cervical back: Neck supple.  Skin:    General: Skin is warm and dry.     Capillary Refill: Capillary refill takes less than 2 seconds.  Neurological:     Mental Status: She is alert.     Cranial Nerves: No dysarthria or facial asymmetry.     Sensory: No sensory deficit.     Motor: No tremor or abnormal muscle tone.  Psychiatric:        Mood and Affect: Mood normal.     ED Results / Procedures / Treatments   Labs (all labs ordered are listed, but only abnormal results are displayed) Labs Reviewed  APTT - Abnormal; Notable for the following components:      Result Value   aPTT 75 (*)    All other components within normal limits  CBC - Abnormal; Notable for the following components:   Hemoglobin 10.5 (*)    HCT 32.8 (*)    MCH 25.8 (*)    Platelets 137 (*)    All other components within normal limits  COMPREHENSIVE METABOLIC PANEL - Abnormal; Notable for the following components:   Sodium 134 (*)    Chloride 96 (*)    Glucose, Bld 102 (*)    BUN 34 (*)    Creatinine, Ser 5.91 (*)    Calcium 8.8 (*)    GFR, Estimated 7 (*)    All other components within normal limits  URINALYSIS, ROUTINE W REFLEX MICROSCOPIC - Abnormal; Notable for the following components:   Color, Urine STRAW (*)    pH 9.0 (*)    Glucose, UA 50 (*)    Protein, ur 100 (*)    Bacteria, UA RARE (*)    All other components within normal limits  I-STAT CHEM 8, ED - Abnormal; Notable for the following components:   BUN 35 (*)    Creatinine, Ser 6.40 (*)    Glucose, Bld 100 (*)     Calcium, Ion 1.05 (*)    Hemoglobin 10.9 (*)    HCT 32.0 (*)    All other components within normal limits  CBG MONITORING, ED - Abnormal; Notable for the following components:   Glucose-Capillary 67 (*)    All other components within normal limits  CBG MONITORING, ED - Abnormal; Notable for the following components:   Glucose-Capillary 101 (*)    All other components within normal limits  ETHANOL  PROTIME-INR  DIFFERENTIAL  RAPID URINE DRUG SCREEN, HOSP PERFORMED  HEMOGLOBIN A1C  HEPATITIS B SURFACE ANTIGEN  HEPATITIS B SURFACE ANTIBODY, QUANTITATIVE  CBG MONITORING, ED  CBG MONITORING, ED  TROPONIN I (HIGH SENSITIVITY)    EKG EKG Interpretation Date/Time:  Monday August 06 2023 07:54:41 EDT Ventricular Rate:  72 PR Interval:  146 QRS Duration:  76 QT Interval:  414 QTC Calculation: 453 R Axis:   27  Text Interpretation: Normal sinus rhythm Normal ECG When compared with ECG of 24-May-2023 09:24, PREVIOUS ECG IS PRESENT when compared to prior, similar appearance. No sTEMI Confirmed by Theda Belfast ([id number]) on 08/06/2023 8:39:59 AM  Radiology MR ANGIO NECK WO CONTRAST  Result Date: 08/06/2023 CLINICAL DATA:  Neuro deficit, acute, stroke suspected EXAM: MRA NECK WITHOUT CONTRAST TECHNIQUE: Angiographic images of the neck were acquired using MRA technique without intravenous contrast. Carotid stenosis measurements (when applicable) are obtained utilizing NASCET criteria, using the distal internal carotid diameter as the denominator. COMPARISON:  None Available. FINDINGS: Aortic arch: Standard three-vessel branch pattern. Normal flow signal. Right carotid system: Normal flow signal. No focal occlusion. No significant stenosis. Left carotid system: Normal flow signal. No focal occlusion. No significant stenosis Vertebral arteries: The V1 segment of the left vertebral artery is poorly visualized due to respiratory motion artifact. Within this limitation, normal flow signal. No  focal occlusion. No significant stenosis. Other: None. IMPRESSION: No large vessel occlusion or hemodynamically significant stenosis in the neck. Electronically Signed   By: Lorenza Cambridge M.D.   On: 08/06/2023 12:49   DG Chest Portable 1 View  Result Date: 08/06/2023 CLINICAL DATA:  67 year old female with chest pain.  Dialysis. EXAM: PORTABLE CHEST 1 VIEW COMPARISON:  Chest radiographs 06/09/2015. FINDINGS: Portable AP semi upright view at 1045 hours. Right chest dual lumen dialysis type catheter with no adverse features. Cardiomegaly. Tortuous thoracic aorta. Other mediastinal contours are within normal limits. Visualized tracheal air column is within normal limits. Lung volumes are within normal limits. Allowing for portable technique the lungs are clear. No pneumothorax. Partially visible bilateral shoulder arthroplasty. Left subclavian vascular stent also in place. IMPRESSION: 1. Right IJ dialysis type catheter with no adverse features. 2. Cardiomegaly and tortuous thoracic aorta. No acute cardiopulmonary abnormality. Electronically Signed   By: Odessa Fleming M.D.   On: 08/06/2023 11:58   MR BRAIN WO CONTRAST  Result Date: 08/06/2023 CLINICAL DATA:  Neuro deficit, acute, stroke suspected EXAM: MRI HEAD WITHOUT CONTRAST MRA HEAD WITHOUT CONTRAST TECHNIQUE: Multiplanar, multiecho pulse sequences of the brain and surrounding structures were obtained without intravenous contrast. Multiplanar, multi-echo pulse sequences of the brain and surrounding structures were acquired without and with intravenous contrast. Angiographic images of the Circle of Willis were acquired using MRA technique without intravenous contrast. COMPARISON:  None Available. FINDINGS: Brain: Negative for acute infarct. No hemorrhage. No hydrocephalus. No extra-axial fluid collection. There is sequela of mild chronic microvascular ischemic change. There is T2/stir hyperintense signal in the bilateral occipital and posterior temporal lobes  (series 4, image 17-14). 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 There is an additional lesion that is along the splenium of the corpus callosum on the right (series 4, image 20). Enlarged and partially empty sella. Vascular: Normal flow voids. Skull and upper cervical spine: Normal marrow signal. Sinuses/Orbits: Negative. Other: None. MRA HEAD FINDINGS Anterior circulation: No aneurysm. No proximal occlusion. No significant stenosis. Posterior circulation: No aneurysm. No proximal occlusion. No significant stenosis.  Anatomic variants: Fetal PCA on the right IMPRESSION: 1. Negative for an acute infarct. 2. T2/FLAIR hyperintense signal in the bilateral occipital and posterior temporal lobes are favored to represent findings related to PRES. 3. Additional scattered areas of cortical T2/FLAIR hyperintense are favored to represent chronic infarcts. One of the lesions involves the splenium of the corpus callosum on the right. Further evaluation with a contrast enhanced brain MRI could be considered for more definitive characterization. 4. No intracranial large vessel occlusion. Electronically Signed   By: Lorenza Cambridge M.D.   On: 08/06/2023 10:29   MR ANGIO HEAD WO CONTRAST  Result Date: 08/06/2023 CLINICAL DATA:  Neuro deficit, acute, stroke suspected EXAM: MRI HEAD WITHOUT CONTRAST MRA HEAD WITHOUT CONTRAST TECHNIQUE: Multiplanar, multiecho pulse sequences of the brain and surrounding structures were obtained without intravenous contrast. Multiplanar, multi-echo pulse sequences of the brain and surrounding structures were acquired without and with intravenous contrast. Angiographic images of the Circle of Willis were acquired using MRA technique without intravenous contrast. COMPARISON:  None Available. FINDINGS: Brain: Negative for acute infarct. No hemorrhage. No hydrocephalus. No extra-axial fluid collection. There is  sequela of mild chronic microvascular ischemic change. There is T2/stir hyperintense signal in the bilateral occipital and posterior temporal lobes (series 4, image 17-14). 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 There is an additional lesion that is along the splenium of the corpus callosum on the right (series 4, image 20). Enlarged and partially empty sella. Vascular: Normal flow voids. Skull and upper cervical spine: Normal marrow signal. Sinuses/Orbits: Negative. Other: None. MRA HEAD FINDINGS Anterior circulation: No aneurysm. No proximal occlusion. No significant stenosis. Posterior circulation: No aneurysm. No proximal occlusion. No significant stenosis. Anatomic variants: Fetal PCA on the right IMPRESSION: 1. Negative for an acute infarct. 2. T2/FLAIR hyperintense signal in the bilateral occipital and posterior temporal lobes are favored to represent findings related to PRES. 3. Additional scattered areas of cortical T2/FLAIR hyperintense are favored to represent chronic infarcts. One of the lesions involves the splenium of the corpus callosum on the right. Further evaluation with a contrast enhanced brain MRI could be considered for more definitive characterization. 4. No intracranial large vessel occlusion. Electronically Signed   By: Lorenza Cambridge M.D.   On: 08/06/2023 10:29   CT HEAD CODE STROKE WO CONTRAST  Result Date: 08/06/2023 CLINICAL DATA:  Code stroke. 67 year old female (not otherwise specified at the time of this report). EXAM: CT HEAD WITHOUT CONTRAST TECHNIQUE: Contiguous axial images were obtained from the base of the skull through the vertex without intravenous contrast. RADIATION DOSE REDUCTION: This exam was performed according to the departmental dose-optimization program which includes automated exposure control, adjustment of the mA and/or kV according to patient size and/or use of iterative  reconstruction technique. COMPARISON:  None Available. FINDINGS: Brain: No midline shift, mass effect, or evidence of intracranial mass lesion. No ventriculomegaly. No acute intracranial hemorrhage identified. Gray-white differentiation within normal limits for age throughout the brain. No acute or chronic cortically based infarct identified. Small left coronal fissure cyst, normal variant. Vascular: No suspicious intracranial vascular hyperdensity. Calcified atherosclerosis at the skull base. Skull: No acute osseous abnormality identified. Sinuses/Orbits: Visualized paranasal sinuses and mastoids are clear. Other: No gaze deviation.  No acute scalp soft tissue finding. ASPECTS [facility] Stroke Program Early CT Score) Total score (0-10 with 10 being normal): 10 IMPRESSION: 1. Normal for age noncontrast CT appearance of the brain. ASPECTS 10. 2.  These results were communicated to Dr. Otelia Limes at 8:51 am on 08/06/2023 by text page via the [facility] messaging system. Electronically Signed   By: Odessa Fleming M.D.   On: 08/06/2023 08:51    Procedures Procedures    CRITICAL CARE Performed by: Canary Brim Mellina Benison Total critical care time: 45 minutes Critical care time was exclusive of separately billable procedures and treating other patients. Critical care was necessary to treat or prevent imminent or life-threatening deterioration. Critical care was time spent personally by me on the following activities: development of treatment plan with patient and/or surrogate as well as nursing, discussions with consultants, evaluation of patient's response to treatment, examination of patient, obtaining history from patient or surrogate, ordering and performing treatments and interventions, ordering and review of laboratory studies, ordering and review of radiographic studies, pulse oximetry and re-evaluation of patient's condition.    Medications Ordered in ED Medications  labetalol (NORMODYNE) injection 20 mg (20 mg  Intravenous Given 08/06/23 1322)  hydrALAZINE (APRESOLINE) injection 20 mg (20 mg Intravenous Given 08/06/23 1214)  clevidipine (CLEVIPREX) infusion 0.5 mg/mL (8 mg/hr Intravenous Rate/Dose Change 08/06/23 1502)  Chlorhexidine Gluconate Cloth 2 % PADS 6 each (has no administration in time range)  dextrose (GLUTOSE) oral gel 40% (31 g Oral Given 08/06/23 0900)  labetalol (NORMODYNE) injection 10 mg (10 mg Intravenous Given 08/06/23 1125)    ED Course/ Medical Decision Making/ A&P                                 Medical Decision Making Amount and/or Complexity of Data Reviewed Radiology: ordered.  Risk OTC drugs. Prescription drug management. Decision regarding hospitalization.    Christina Vasquez is a 67 y.o. female with a past medical history significant for ESRD with dialysis, hypertension, anxiety, depression, hyperparathyroidism, and recent right arm AV fistula status post removal 6 days ago who presents with recent chest pain, recent right arm pain, and then upon arrival had difficulty with speech.  According to first look team, patient was speaking with EMS but then upon arrival was having difficult time making sense with communication.  She was having word finding difficulty and cannot name certain fingers.  Code stroke was activated and she was taken to scanner.  Upon my arrival to the scanner, patient was speaking more clearly.  Patient had CT head that did not show acute bleed.  Patient has a chest catheter for dialysis as her right arm fistula was just removed.  Left arm fistula was previously ligated.  On exam patient did have a right radial pulse.    Neurology speaking to family who is also here.  On my evaluation of patient, she is denying any current chest pain, shortness of breath and denying arm pain.  She think she is speaking more clearly now.  Neurology is recommending MRI imaging to further evaluate.  I spoke with nephrology who felt that we could use the left arm for IV  access for further imaging.  Will await reassessment when she gets back to the room to do more full neuroexam and due to the recent chest pain she had several days ago that was reported, will get chest x-ray, troponin, and cardiac workup.  She was not describe any pleuritic discomfort at this time and is not short of breath, doubt thromboembolic disease right now.  Anticipate reassessment after return from MRI.        11:00 AM On my reassessment  she has intact sensation and strength in arms and legs.  Symmetric smile.  Speech is now clear for me.  Blood pressure is still 200 systolic.  MRI returned showing concern for press.  Neurology feels needs admission to medicine to gently bring the blood pressure down slowly.  Will give some labetalol initially as her heart rate is around 80 and blood pressure still 200.  Will call medicine for admission for further management of intermittent communication and speech difficulties in the setting of press and hypertensive emergency.  No seizure-like activity seen.  She is still getting the chest x-ray and troponin given the recent chest pain but again she has no chest pain or shortness of breath today.  Patient needed Cleviprex so ICU team was called for admission.         Final Clinical Impression(s) / ED Diagnoses Final diagnoses:  PRES (posterior reversible encephalopathy syndrome)  Speech problem     Clinical Impression: 1. PRES (posterior reversible encephalopathy syndrome)   2. Speech problem     Disposition: Admit  This note was prepared with assistance of Dragon voice recognition software. Occasional wrong-word or sound-a-like substitutions may have occurred due to the inherent limitations of voice recognition software.      Zakariye Nee, Canary Brim, MD 08/06/23 [phone]

## 2023-08-06 NOTE — ED Notes (Signed)
Unable to obtain IV access at this time due to left sided fistula and return fistula surgery on right arm.

## 2023-08-06 NOTE — Consult Note (Signed)
Renal Service Consult Note [facility]  Christina Vasquez 08/06/2023 Christina Krabbe, MD Requesting Physician: Dr. Rush Landmark  Reason for Consult: ESRD pt w/ AMS, r/o CVA, uncont HTN HPI: The patient is a 67 y.o. year-old w/ PMH as below who presented to ED from outpatient HD this am. She only had 1h dialysis, came to ED c/o L arm pain and numbness, also going up into the chest w/ some SOB. She was also confused on arrival to ED. Code stroke was activated. Her MS has since improved. Pt to be admitted. We are asked to see for dialysis.   Pt seen in ED. Family at bedside. Pt has no c/o's, good historian, not confused at this time. No sob or cough or hx of dialysis issues.   ROS - denies CP, no joint pain, no HA, no blurry vision, no rash, no diarrhea, no nausea/ vomiting  Past Medical History  Past Medical History:  Diagnosis Date   Anemia    Anxiety    Arthritis    Hyperparathyroidism (HCC)    Hypertension    Major depressive disorder 08/15/2018   PONV (postoperative nausea and vomiting)    Happened one time. Has had sx since then with no N/V   Renal disorder 12/19/2018   Stage 4 ESRD. HD-MWF   Sleep apnea    does not use CPAP   Past Surgical History  Past Surgical History:  Procedure Laterality Date   A/V FISTULAGRAM  2019   ABDOMINAL HYSTERECTOMY  2005   AV FISTULA PLACEMENT Right 07/24/2023   Procedure: RIGHT UPPER EXTREMITY ARTERIOVENOUS GRAFT CREATION;  Surgeon: Chuck Hint, MD;  Location: [facility] OR;  Service: Vascular;  Laterality: Right;   AVGG REMOVAL Right 07/31/2023   Procedure: REMOVAL OF RIGHT ARM ARTERIOVENOUS GORETEX GRAFT (AVGG);  Surgeon: Daria Pastures, MD;  Location: [facility] OR;  Service: Vascular;  Laterality: Right;   BREAST EXCISIONAL BIOPSY Left 2017   scar not visible   CHOLECYSTECTOMY  2002   COLONOSCOPY  2010   COLONOSCOPY WITH PROPOFOL N/A 01/02/2023   Procedure: COLONOSCOPY WITH PROPOFOL;  Surgeon: Jeani Hawking, MD;  Location: [facility]  ENDOSCOPY;  Service: Gastroenterology;  Laterality: N/A;   IR AV DIALY SHUNT INTRO NEEDLE/INTRAC INITIAL W/PTA/STENT/IMG LT  2021   Stent placed in Left Arm Fistula. Placed in [place]   LIGATION OF ARTERIOVENOUS  FISTULA Left 06/01/2023   Procedure: LIGATION OF LEFT ARM ARTERIOVENOUS  FISTULA WITH HEMATOMA EVACUATION;  Surgeon: Maeola Harman, MD;  Location: [facility] OR;  Service: Vascular;  Laterality: Left;   POLYPECTOMY  01/02/2023   Procedure: POLYPECTOMY;  Surgeon: Jeani Hawking, MD;  Location: [facility] ENDOSCOPY;  Service: Gastroenterology;;   REVERSE SHOULDER ARTHROPLASTY Right 06/01/2022   Procedure: REVERSE SHOULDER ARTHROPLASTY;  Surgeon: Bjorn Pippin, MD;  Location: [facility] OR;  Service: Orthopedics;  Laterality: Right;   REVERSE SHOULDER ARTHROPLASTY Left 09/21/2022   Procedure: REVERSE SHOULDER ARTHROPLASTY;  Surgeon: Bjorn Pippin, MD;  Location: [facility] OR;  Service: Orthopedics;  Laterality: Left;   TOTAL KNEE ARTHROPLASTY Right 2008   TUBAL LIGATION     Family History  Family History  Problem Relation Age of Onset   Breast cancer Sister    Breast cancer Cousin        maternal first cousin   Social History  reports that she has never smoked. She has never used smokeless tobacco. She reports that she does not currently use alcohol. She reports that she does not use drugs. Allergies  Allergies  Allergen Reactions   Meloxicam Hives and Rash   Augmentin [Amoxicillin-Pot Clavulanate] Nausea And Vomiting   Tetracyclines & Related Hives   Home medications Prior to Admission medications   Medication Sig Start Date End Date Taking? Authorizing Provider  acetaminophen (TYLENOL) 650 MG CR tablet Take 1,300 mg by mouth every 8 (eight) hours as needed for pain.   Yes [provider]  amLODipine (NORVASC) 10 MG tablet Take 10 mg by mouth daily.   Yes [provider]  cinacalcet (SENSIPAR) 60 MG tablet Take 60 mg by mouth every Monday, Wednesday, and Friday with  hemodialysis.   Yes [provider]  folic acid (FOLVITE) 1 MG tablet Take 1 tablet (1 mg total) by mouth daily. 07/12/23  Yes Jaci Standard, MD  metoprolol succinate (TOPROL-XL) 50 MG 24 hr tablet Take 50 mg by mouth at bedtime. Take with or immediately following a meal.   Yes [provider]  omeprazole (PRILOSEC) 40 MG capsule Take 40 mg by mouth daily.   Yes [provider]  oxyCODONE (OXY IR/ROXICODONE) 5 MG immediate release tablet Take 1 tablet (5 mg total) by mouth every 6 (six) hours as needed for severe pain. 07/31/23  Yes Daria Pastures, MD  sucroferric oxyhydroxide (VELPHORO) 500 MG chewable tablet Chew 1,500 mg by mouth 3 (three) times daily with meals.   Yes [provider]  venlafaxine XR (EFFEXOR-XR) 37.5 MG 24 hr capsule Take 37.5 mg by mouth daily with breakfast.   Yes [provider]  VITAMIN D PO Take 4 capsules by mouth every Monday, Wednesday, and Friday with hemodialysis.   Yes [provider]  oxyCODONE (ROXICODONE) 5 MG immediate release tablet Take 1 tablet (5 mg total) by mouth every 6 (six) hours as needed for severe pain. 07/24/23   Schuh, McKenzi P, PA-C  Semaglutide,0.25 or 0.5MG /DOS, (OZEMPIC, 0.25 OR 0.5 MG/DOSE,) 2 MG/3ML SOPN Inject 0.25-0.5 mg into the skin once a week. Patient not taking: Reported on 08/06/2023 07/11/23   [provider]     Vitals:   08/06/23 1300 08/06/23 1320 08/06/23 1330 08/06/23 1345  BP: (!) 197/92 (!) 201/88 (!) 172/84 (!) 173/82  Pulse: 87 86 87 85  Resp: 17 (!) 29 17 (!) 21  Temp:      TempSrc:      SpO2: 99% 100% 100% 99%   Exam Gen alert, no distress No rash, cyanosis or gangrene Sclera anicteric, throat clear  No jvd or bruits Chest clear bilat to bases, no rales/ wheezing RRR no MRG Abd soft ntnd no mass or ascites +bs GU defer MS no joint effusions or deformity Ext no LE or UE edema, no wounds or ulcers Neuro is alert, Ox 3 , nf    RIJ TDC in  place   Access history: 06/01/23 - ligation of LUE AVF w/ thrombectomy of 2 large PA's (due to persistent pain and swelling) 07/24/23 - new R AVG placement  07/31/23 - new R AVG removed due to steal symptoms    Renal-related home meds: - norvasc 10 daily - cinacalcet 60mg  mwf - metoprolol xl 50 hs - velphoro 1.5gm ac tid    OP HD: MWF [facility]  3.5h  400/1.5   98.5kg   2/2 bath   TDC RIJ   Heparin 7000+ prn - last HD 9/30 this am, got 1 hr HD and then sent to HD    Assessment/ Plan: AMS - r/o CVA per pmd ESRD - on HD  MWF. Shortened HD this am. Pt's vol and K+ stable. Plan HD Tuesday 1st shift.  HTN - uncontrolled, got worse in ED. Getting appropriate IV meds Volume - no gross vol excess on exam. Get wt's when in a room.  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 No esa needs.  HD access - had her L AVF ligated in the summer to due painful aneuryms. Then had AVG placed 9/17, but then AVG was removed 9/24 due to steal symptoms. Continues to be cath-dependent.     Vinson Moselle  MD [facility] 08/06/2023, 1:53 PM  Recent Labs  Lab 08/06/23 1100 08/06/23 1108  HGB 10.5* 10.9*  ALBUMIN 3.5  --   CALCIUM 8.8*  --   CREATININE 5.91* 6.40*  K 4.1 4.3   Inpatient medications:  diclofenac Sodium  4 g Topical QID    clevidipine     hydrALAZINE, labetalol

## 2023-08-06 NOTE — Consult Note (Signed)
Neurology Consultation  Reason for Consult: Code Stroke Referring Physician: Dr. Criss Alvine  CC: Confusion, right sided weakness  History is obtained from: patient  HPI: Christina Vasquez is a 67 y.o. female with a past medical history of anemia, anxiety, arthritis, hyperparathyroidism, hypertension, depression, ESRD on dialysis MWF, sleep apnea, recent fistula removal on the right arm presenting with right arm pain and confusion.  She had an AV fistula placed in the right arm that was removed 6 days ago due to pain and loss of feeling in her right arm.  She was at dialysis today which was underway with a tunneled catheter but approximately 1 hour into her session she became acutely confused.  EMS was called and she was brought to the [facility] ED.  On arrival she was hypertensive with a blood pressure of 223/113 that then improved to 185/94 spontaneously. Dysphasia was noted on exam, with word-finding difficulty and paraphasic errors. There was difficulty with IV access due to limb restriction and poor vasculature.  Glucose was 67 she was given an oral glucose load for blood sugar correction and she did have improvement about 15 minutes afterwards in conjunction with some improvement in her BP. She was taken for a STAT MRI after CT scan.  On further discussion with family they state that she has been having significant right arm pain for the last couple of weeks due to her fistula placement on 9/17 and she has been intermittently taking her oxycodone for the pain.  Additionally they tell me that she does not eat breakfast prior to her dialysis sessions on Monday, Wednesday and Friday and they are not sure if she took oxycodone prior to dialysis today either.  Oxycodone is a newer medication since the fistula placement and subsequent removal.  She does have a dialysis catheter in her right internal jugular vein.  Delay in attempts to obtain CTA due to being unable to obtain IV access due to unknown  status of old left fistula site and recent fistula removal on the right. MRA was therefore obtained with MRI. Vascular Surgery, Nephrology, and IV team contacted.   LKW: 0800 TNK given?: No , symptoms too mild to treat after rapid improvement with glucose administration and lowering of BP Mechanical Thrombectomy? No, overall presentation is not consistent with LVO Premorbid modified Rankin scale (mRS): 1  ROS: Full ROS was performed and is negative except as noted in the HPI.   Past Medical History:  Diagnosis Date   Anemia    Anxiety    Arthritis    Hyperparathyroidism (HCC)    Hypertension    Major depressive disorder 08/15/2018   PONV (postoperative nausea and vomiting)    Happened one time. Has had sx since then with no N/V   Renal disorder 12/19/2018   Stage 4 ESRD. HD-MWF   Sleep apnea    does not use CPAP    Family History  Problem Relation Age of Onset   Breast cancer Sister    Breast cancer Cousin        maternal first cousin    Social History:   reports that she has never smoked. She has never used smokeless tobacco. She reports that she does not currently use alcohol. She reports that she does not use drugs.  Medications  Current Facility-Administered Medications:    diclofenac Sodium (VOLTAREN) 1 % topical gel 4 g, 4 g, Topical, QID, Huel Cote, MD  Current Outpatient Medications:    acetaminophen (TYLENOL) 650 MG CR tablet, Take  1,300 mg by mouth every 8 (eight) hours as needed for pain., Disp: , Rfl:    amLODipine (NORVASC) 10 MG tablet, Take 10 mg by mouth daily., Disp: , Rfl:    cinacalcet (SENSIPAR) 60 MG tablet, Take 60 mg by mouth every Monday, Wednesday, and Friday with hemodialysis., Disp: , Rfl:    folic acid (FOLVITE) 1 MG tablet, Take 1 tablet (1 mg total) by mouth daily., Disp: 90 tablet, Rfl: 0   metoprolol succinate (TOPROL-XL) 50 MG 24 hr tablet, Take 50 mg by mouth at bedtime. Take with or immediately following a meal., Disp: , Rfl:     omeprazole (PRILOSEC) 40 MG capsule, Take 40 mg by mouth daily., Disp: , Rfl:    oxyCODONE (OXY IR/ROXICODONE) 5 MG immediate release tablet, Take 1 tablet (5 mg total) by mouth every 6 (six) hours as needed for severe pain., Disp: 10 tablet, Rfl: 0   oxyCODONE (ROXICODONE) 5 MG immediate release tablet, Take 1 tablet (5 mg total) by mouth every 6 (six) hours as needed for severe pain., Disp: 10 tablet, Rfl: 0   sucroferric oxyhydroxide (VELPHORO) 500 MG chewable tablet, Chew 1,500 mg by mouth 3 (three) times daily with meals., Disp: , Rfl:    venlafaxine XR (EFFEXOR-XR) 37.5 MG 24 hr capsule, Take 37.5 mg by mouth daily with breakfast., Disp: , Rfl:    VITAMIN D PO, Take 4 capsules by mouth every Monday, Wednesday, and Friday with hemodialysis., Disp: , Rfl:    Exam: Current vital signs: BP (!) 176/123 (BP Location: Left Wrist)   Pulse 71   Temp 98.2 F (36.8 C) (Oral)   Resp (!) 22   SpO2 100%  Vital signs in last 24 hours: Temp:  [98.2 F (36.8 C)] 98.2 F (36.8 C) (09/30 0830) Pulse Rate:  [71] 71 (09/30 0830) Resp:  [22] 22 (09/30 0830) BP: (176)/(123) 176/123 (09/30 0830) SpO2:  [100 %] 100 % (09/30 0830)  GENERAL: Awake, alert in NAD HEENT: - Normocephalic and atraumatic, dry mm, no LN++, no Thyromegally LUNGS - Clear to auscultation bilaterally with no wheezes CV - S1S2 RRR, no m/r/g, equal pulses bilaterally. ABDOMEN - Soft, nontender, nondistended with normoactive BS Ext: warm, well perfused, intact peripheral pulses, no edema  NEURO:  Mental Status: Awake, alert, intermittently confused Language: speech is clear.  Intermittent paraphasia Cranial Nerves: PERRL. EOMI, visual fields full, no facial asymmetry, facial sensation intact, hearing intact, tongue/uvula/soft palate midline, normal sternocleidomastoid and trapezius muscle strength. No evidence of tongue atrophy or fasciculations Motor:  Strength slightly diminished on the right to 4/5 in the context of RUE pain   LUE 5/5 Bilateral lower extremities 5/5 Tone is normal and bulk is normal Sensation- Intact to light touch bilaterally Coordination: FTN intact bilaterally, no ataxia in BLE. Gait- deferred  NIHSS 1a Level of Conscious.: 0 1b LOC Questions: 0 1c LOC Commands: 0 2 Best Gaze: 0 3 Visual: 0 4 Facial Palsy: 0 5a Motor Arm - left: 0 5b Motor Arm - Right: 1 6a Motor Leg - Left: 0 6b Motor Leg - Right: 0 7 Limb Ataxia: 0 8 Sensory: 0 9 Best Language: 1 10 Dysarthria: 0 11 Extinct. and Inatten.: 0 TOTAL: 2   Labs I have reviewed labs in epic and the results pertinent to this consultation are:   CBC    Component Value Date/Time   WBC 4.8 07/12/2023 0747   WBC 9.0 05/24/2023 0909   RBC 4.61 07/12/2023 0748   RBC 4.66 07/12/2023 0747  HGB 9.9 (L) 07/31/2023 1212   HGB 12.1 07/12/2023 0747   HCT 29.0 (L) 07/31/2023 1212   PLT 154 07/12/2023 0747   MCV 82.2 07/12/2023 0747   MCH 26.0 07/12/2023 0747   MCHC 31.6 07/12/2023 0747   RDW 15.9 (H) 07/12/2023 0747   LYMPHSABS 1.1 07/12/2023 0747   MONOABS 0.3 07/12/2023 0747   EOSABS 0.1 07/12/2023 0747   BASOSABS 0.0 07/12/2023 0747    CMP     Component Value Date/Time   NA 137 07/31/2023 1212   K 4.7 07/31/2023 1212   CL 98 07/31/2023 1212   CO2 27 07/12/2023 0747   GLUCOSE 78 07/31/2023 1212   BUN 38 (H) 07/31/2023 1212   CREATININE 5.70 (H) 07/31/2023 1212   CREATININE 5.48 (H) 07/12/2023 0747   CALCIUM 9.3 07/12/2023 0747   PROT 6.9 07/12/2023 0747   ALBUMIN 4.1 07/12/2023 0747   AST 15 07/12/2023 0747   ALT 14 07/12/2023 0747   ALKPHOS 74 07/12/2023 0747   BILITOT 0.4 07/12/2023 0747   GFRNONAA 8 (L) 07/12/2023 0747    Lipid Panel  No results found for: "CHOL", "TRIG", "HDL", "CHOLHDL", "VLDL", "LDLCALC", "LDLDIRECT"   Imaging I have reviewed the images obtained:  CT head: Normal CT Head   MRI examination of the brain - Negative for acute infarct. -T2/FLAIR hyperintense signal abnormalities in  the bilateral occipital and posterior temporal lobes are favored to represent findings related to PRES.  -Additional scattered areas of cortical T2/FLAIR hyperintensities are favored to represent chronic infarcts. One of the lesions involves the splenium of the corpus callosum on the right.  MRA Head and Neck - No intracranial large vessel occlusion.  Assessment: 67 y.o. female presenting with acute confusion and aphasia in the context of severely elevated BP and low blood glucose.  - MRI shows findings consistent with PRES.  - SBP on arrival 223 and SBP now 160-180 systolic.  Maintain systolic blood pressure less than 160.   - PRN BP medications ordered. However, she may need a Cleviprex infusion if these are not adequately controlling her blood pressure.   - Additionally, we have ordered a hemoglobin A1c to look at diabetes control given her hypoglycemia on admission.  Upon further discussion with family we discovered that she does not typically eat prior to her dialysis session which may have contributed to her presentation, but PRES is felt to be the primary etiology.  Impression: - Hypertensive encephalopathy/PRES  - Hypoglycemia  - Post operative pain in right arm  Recommendations: - BP goal less than 160 - PRN labetalol and hydralazine ordered - May need addition of Cleviprex - Frequent neuro checks - CANNOT have a contrasted MRI scan due to ESRD (can result in severe complication of nephrogenic systemic fibrosis - HgbA1c   Patient seen and examined by NP/APP with MD.  Elmer Picker, DNP, FNP-BC [facility] Pager: [phone]  I have seen and examined the patient. I have formulated the assessment and recommendations. 67 year old female with ESRD presenting from dialysis with AMS and dysphasia in the setting of malignant HTN. MRI brain reveals findings that are most consistent with PRES. Dysphasia improved with reduction of SBP to 185. Will need further BP reduction  with SBP goal of < 160 for now, with further gradual reduction tomorrow. Will need Nephrology input. Other recommendations as above.  Electronically signed: Dr. Caryl Pina

## 2023-08-06 NOTE — ED Notes (Signed)
Patient assessed by myself, complaining of chest pain that brought her in, no worsening in severity. BP maintaining in goal of cleviprex order.

## 2023-08-06 NOTE — H&P (Signed)
NAME:  Christina Vasquez, MRN:  [id number], DOB:  [birth year], LOS: 0 ADMISSION DATE:  08/06/2023, CONSULTATION DATE:  9/30 REFERRING MD:  Tegeler, CHIEF COMPLAINT:  hypertensive emergency and PRES   History of Present Illness:  This is a pleasant 67 year old female patient with end-stage renal disease and hypertension.  Also has had 2 failed dialysis access sites the left fistula, and failed right graft with associated significant nerve discomfort undergoes dialysis on Monday/Wednesday/Fridays.  Was in usual state of health until 9/28 when noted new onset of headache and intermittent chest discomfort.  The onset of the chest discomfort was not related to activity, and primarily over the right side of the chest, there was no neck or left arm discomfort however the headache persisted.  She has had no change in medications.  She has been taking her medications as prescribed.  Her dry weight has been stable at 98.5 kg.  She presented to dialysis on 9/30 was about 1 hour into therapy noting fairly significant headache and just not feeling well.  She became progressively confused and EMS was called.  On arrival blood pressure was 223/113.  Glucose 67, a code stroke was initially called and she was emergently brought to CT scanner.  This was negative, a subsequent MRI was obtained this showed T2 flair with hyperintense signal at bilateral occipital and posterior temporal lobes consistent with PRES, she was started on a Cleviprex drip, and critical care asked to admit.   Pertinent  Medical History  Hypertension, end-stage renal disease on Monday/Wednesday/Friday schedule dry weight 98.5 kg Thrombosed left upper arm fistula status post thrombectomy and surgical repair New right AV graft w/ sig pain felt 2/2 nerve pain reversed on 9/17 IV tunneled cath on right side Anemia of renal disease, hyperparathyroidism, depression, postoperative nausea vomited Cobos sleep apnea not on CPAP, anxiety.  Significant Hospital  Events: Including procedures, antibiotic start and stop dates in addition to other pertinent events   Admitted with press on 9/30 Interim History / Subjective:  Headache currently resolved Objective   Blood pressure (!) 177/83, pulse 91, temperature 98.5 F (36.9 C), temperature source Oral, resp. rate 17, SpO2 99%.       No intake or output data in the 24 hours ending 08/06/23 1441 There were no vitals filed for this visit.  Examination: General: Pleasant 67 year old female sitting up in bed no acute distress HENT: Normocephalic atraumatic no jugular venous distention mucous membranes moist Lungs: Clear to auscultation without accessory use currently room air Cardiovascular: Regular rate and rhythm without murmur rub or gallop Abdomen: Soft not tender Extremities: Warm and dry no edema left old fistula site healed.  Right sided site intact, notes numbness to right fingers with decreased dexterity  Neuro: awake and oriented no focal def other than right arm parasthesia  GU: anuric   Resolved Hospital Problem list     Assessment & Plan:   Hypertensive emergency w/ PRES Plan Now several hours in ER, goal SBP <160 w/ cleviprex w/ goal change to < 140 after 24 hrs (tomorrow 1 pm) Adding back oral antihypertensives iHD ordered ECHO Tele  ICU admit   History of end-stage renal disease.  Dry weight 98.5 kg Monday/Wednesday/Friday Ready seen by nephrology Plan She will receive dialysis tomorrow on 10/1  Anemia in the setting of end-stage renal disease Plan Continue to monitor  Right arm pain and paresthesia.  Status post removal of AVG on 9/17 due to steal symptoms Plan I did speak to vascular surgery nothing  urgent diagnostic to do They will follow remotely with plan to follow as outpatient Best Practice (right click and "Reselect all SmartList Selections" daily)   Diet/type: Regular consistency (see orders) DVT prophylaxis: prophylactic heparin  GI prophylaxis:  N/A Lines: N/A Foley:  N/A Code Status:  full code Last date of multidisciplinary goals of care discussion [pending]  Labs   CBC: Recent Labs  Lab 07/31/23 1212 08/06/23 1100 08/06/23 1108  WBC  --  6.1  --   NEUTROABS  --  4.7  --   HGB 9.9* 10.5* 10.9*  HCT 29.0* 32.8* 32.0*  MCV  --  80.6  --   PLT  --  137*  --     Basic Metabolic Panel: Recent Labs  Lab 07/31/23 1212 08/06/23 1100 08/06/23 1108  NA 137 134* 138  K 4.7 4.1 4.3  CL 98 96* 102  CO2  --  24  --   GLUCOSE 78 102* 100*  BUN 38* 34* 35*  CREATININE 5.70* 5.91* 6.40*  CALCIUM  --  8.8*  --    GFR: Estimated Creatinine Clearance: 9.6 mL/min (A) (by C-G formula based on SCr of 6.4 mg/dL (H)). Recent Labs  Lab 08/06/23 1100  WBC 6.1    Liver Function Tests: Recent Labs  Lab 08/06/23 1100  AST 16  ALT 15  ALKPHOS 73  BILITOT 0.6  PROT 6.6  ALBUMIN 3.5   No results for input(s): "LIPASE", "AMYLASE" in the last 168 hours. No results for input(s): "AMMONIA" in the last 168 hours.  ABG    Component Value Date/Time   TCO2 26 08/06/2023 1108     Coagulation Profile: Recent Labs  Lab 08/06/23 1100  INR 1.1    Cardiac Enzymes: No results for input(s): "CKTOTAL", "CKMB", "CKMBINDEX", "TROPONINI" in the last 168 hours.  HbA1C: Hgb A1c MFr Bld  Date/Time Value Ref Range Status  08/06/2023 11:15 AM 5.2 4.8 - 5.6 % Final    Comment:    (NOTE) Pre diabetes:          5.7%-6.4%  Diabetes:              >6.4%  Glycemic control for   <7.0% adults with diabetes     CBG: Recent Labs  Lab 08/06/23 0823 08/06/23 0855 08/06/23 0907 08/06/23 1050  GLUCAP 76 67* 87 101*    Review of Systems:   Review of Systems  Constitutional:  Negative for chills, fever and weight loss.  Eyes:  Negative for blurred vision and double vision.  Respiratory: Negative.    Cardiovascular:  Positive for chest pain.       Right arm and chest   Gastrointestinal: Negative.   Genitourinary: Negative.    Musculoskeletal: Negative.   Skin: Negative.   Neurological:  Positive for loss of consciousness and headaches.  Psychiatric/Behavioral: Negative.       Past Medical History:  She,  has a past medical history of Anemia, Anxiety, Arthritis, Hyperparathyroidism (HCC), Hypertension, Major depressive disorder (08/15/2018), PONV (postoperative nausea and vomiting), Renal disorder (12/19/2018), and Sleep apnea.   Surgical History:   Past Surgical History:  Procedure Laterality Date   A/V FISTULAGRAM  2019   ABDOMINAL HYSTERECTOMY  2005   AV FISTULA PLACEMENT Right 07/24/2023   Procedure: RIGHT UPPER EXTREMITY ARTERIOVENOUS GRAFT CREATION;  Surgeon: Chuck Hint, MD;  Location: [facility] OR;  Service: Vascular;  Laterality: Right;   AVGG REMOVAL Right 07/31/2023   Procedure: REMOVAL OF RIGHT ARM ARTERIOVENOUS GORETEX GRAFT (AVGG);  Surgeon: Daria Pastures, MD;  Location: [facility] OR;  Service: Vascular;  Laterality: Right;   BREAST EXCISIONAL BIOPSY Left 2017   scar not visible   CHOLECYSTECTOMY  2002   COLONOSCOPY  2010   COLONOSCOPY WITH PROPOFOL N/A 01/02/2023   Procedure: COLONOSCOPY WITH PROPOFOL;  Surgeon: Jeani Hawking, MD;  Location: [facility] ENDOSCOPY;  Service: Gastroenterology;  Laterality: N/A;   IR AV DIALY SHUNT INTRO NEEDLE/INTRAC INITIAL W/PTA/STENT/IMG LT  2021   Stent placed in Left Arm Fistula. Placed in [place]   LIGATION OF ARTERIOVENOUS  FISTULA Left 06/01/2023   Procedure: LIGATION OF LEFT ARM ARTERIOVENOUS  FISTULA WITH HEMATOMA EVACUATION;  Surgeon: Maeola Harman, MD;  Location: [facility] OR;  Service: Vascular;  Laterality: Left;   POLYPECTOMY  01/02/2023   Procedure: POLYPECTOMY;  Surgeon: Jeani Hawking, MD;  Location: [facility];  Service: Gastroenterology;;   REVERSE SHOULDER ARTHROPLASTY Right 06/01/2022   Procedure: REVERSE SHOULDER ARTHROPLASTY;  Surgeon: Bjorn Pippin, MD;  Location: [facility] OR;  Service: Orthopedics;  Laterality: Right;   REVERSE SHOULDER  ARTHROPLASTY Left 09/21/2022   Procedure: REVERSE SHOULDER ARTHROPLASTY;  Surgeon: Bjorn Pippin, MD;  Location: [facility] OR;  Service: Orthopedics;  Laterality: Left;   TOTAL KNEE ARTHROPLASTY Right 2008   TUBAL LIGATION       Social History:   reports that she has never smoked. She has never used smokeless tobacco. She reports that she does not currently use alcohol. She reports that she does not use drugs.   Family History:  Her family history includes Breast cancer in her cousin and sister.   Allergies Allergies  Allergen Reactions   Meloxicam Hives and Rash   Augmentin [Amoxicillin-Pot Clavulanate] Nausea And Vomiting   Tetracyclines & Related Hives     Home Medications  Prior to Admission medications   Medication Sig Start Date End Date Taking? Authorizing Provider  acetaminophen (TYLENOL) 650 MG CR tablet Take 1,300 mg by mouth every 8 (eight) hours as needed for pain.   Yes [provider]  amLODipine (NORVASC) 10 MG tablet Take 10 mg by mouth daily.   Yes [provider]  cinacalcet (SENSIPAR) 60 MG tablet Take 60 mg by mouth every Monday, Wednesday, and Friday with hemodialysis.   Yes [provider]  folic acid (FOLVITE) 1 MG tablet Take 1 tablet (1 mg total) by mouth daily. 07/12/23  Yes Jaci Standard, MD  metoprolol succinate (TOPROL-XL) 50 MG 24 hr tablet Take 50 mg by mouth at bedtime. Take with or immediately following a meal.   Yes [provider]  omeprazole (PRILOSEC) 40 MG capsule Take 40 mg by mouth daily.   Yes [provider]  oxyCODONE (OXY IR/ROXICODONE) 5 MG immediate release tablet Take 1 tablet (5 mg total) by mouth every 6 (six) hours as needed for severe pain. 07/31/23  Yes Daria Pastures, MD  sucroferric oxyhydroxide (VELPHORO) 500 MG chewable tablet Chew 1,500 mg by mouth 3 (three) times daily with meals.   Yes [provider]  venlafaxine XR (EFFEXOR-XR) 37.5 MG 24 hr capsule Take 37.5 mg by mouth daily  with breakfast.   Yes [provider]  VITAMIN D PO Take 4 capsules by mouth every Monday, Wednesday, and Friday with hemodialysis.   Yes [provider]  oxyCODONE (ROXICODONE) 5 MG immediate release tablet Take 1 tablet (5 mg total) by mouth every 6 (six) hours as needed for severe pain. 07/24/23   Schuh, McKenzi P, PA-C  Semaglutide,0.25 or  0.5MG /DOS, (OZEMPIC, 0.25 OR 0.5 MG/DOSE,) 2 MG/3ML SOPN Inject 0.25-0.5 mg into the skin once a week. Patient not taking: Reported on 08/06/2023 07/11/23   [provider]     Critical care time: 35 min      Simonne Martinet ACNP-BC [facility] Pulmonary/Critical Care Pager # [phone] OR # [phone] if no answer

## 2023-08-06 NOTE — Progress Notes (Signed)
eLink Physician-Brief Progress Note Patient Name: Christina Vasquez DOB: [birth year] MRN: [id number]   Date of Service  08/06/2023  HPI/Events of Note  67-year-old woman with ESRD on HD, hypertension and anemia of chronic disease who was brought to the ER via EMS from her dialysis with significant headache and generally not feeling well.  Hypertensive emergency with PRES.   Patient's blood pressure goal is less than 160 for the first 24 hours, currently 155/75.  Saturating 99% on room air.  Utilizing clevidipine as needed.  Mentating well, answers questions appropriately and follows commands.  Initial results show elevated creatinine, normocytic anemia, but otherwise unremarkable laboratories.  Chest radiograph with right IJ catheter in appropriate place.  MRI imaging consistent with PRES.    eICU Interventions  Oral antihypertensive therapy already scheduled.  Cleviprex already ordered.  Hydralazine and labetalol available  Dialysis in the a.m.  Heparin subcutaneous for DVT prophylaxis GI prophylaxis not indicated.     Intervention Category Evaluation Type: New Patient Evaluation  Sundai Probert 08/06/2023, 9:44 PM

## 2023-08-06 NOTE — ED Triage Notes (Addendum)
EMS stated, one hour of dialysis, The fistula , new graft causing pain the whole arm. The graft was put in on  Sept 17, it was put in and then taken out. She has numbness , pain and pressure on the right side. The pain goes all the way up with pressure on her chest with some SOB .  Dialysis this morning but only there for a hour. Completed treatment on Friday.  While assessing the pt, pt unable to tell me were she is, her name, unable to grip both hands. CBG (last 3)  No results for input(s): "GLUCAP" in the last 72 hours. 76 No arm drift.

## 2023-08-06 NOTE — Code Documentation (Addendum)
Stroke Response Nurse Documentation Code Documentation  Christina Vasquez is a 67 y.o. female arriving to [facility]  via [facility] on 08/06/2023 with past medical hx of Anemia, anxiety, hyper parathyroidism, HTN, depression, ESRD on MWF Dialysis, recent right arm fistula removal. On No antithrombotic. Code stroke was activated by ED.   Patient from dialysis where she was LKW at 0800 and now complaining of right arm pain and confusion. Patient was at dialysis today and completed approximately one hour when she became confused. HD was stopped and EMS was called.   Stroke team at the bedside on patient arrival. Labs drawn and patient cleared for CT by Dr. Julieanne Manson. Patient to CT with team. NIHSS 2, see documentation for details and code stroke times. Patient with disoriented and Expressive aphasia  on exam. The following imaging was completed:  CT Head and MRI. Patient is not a candidate for IV Thrombolytic due to symptoms too mild per MD. Patient is not a candidate for IR due to no LVO noted on imaging per MD.   Care Plan: VS/NIHSS q64min until out of thrombolytic window; then q2hr x12hr, then q4. SBP Goal <160 per MD   Bedside handoff with ED RN Liz Beach.    Felecia Jan  Stroke Response RN

## 2023-08-07 ENCOUNTER — Inpatient Hospital Stay (HOSPITAL_COMMUNITY): Payer: Medicare Other

## 2023-08-07 DIAGNOSIS — R479 Unspecified speech disturbances: Secondary | ICD-10-CM

## 2023-08-07 DIAGNOSIS — I161 Hypertensive emergency: Secondary | ICD-10-CM | POA: Diagnosis not present

## 2023-08-07 DIAGNOSIS — G4733 Obstructive sleep apnea (adult) (pediatric): Secondary | ICD-10-CM | POA: Diagnosis present

## 2023-08-07 DIAGNOSIS — Z992 Dependence on renal dialysis: Secondary | ICD-10-CM

## 2023-08-07 DIAGNOSIS — N186 End stage renal disease: Secondary | ICD-10-CM

## 2023-08-07 DIAGNOSIS — R079 Chest pain, unspecified: Secondary | ICD-10-CM

## 2023-08-07 DIAGNOSIS — I6783 Posterior reversible encephalopathy syndrome: Secondary | ICD-10-CM | POA: Diagnosis not present

## 2023-08-07 DIAGNOSIS — R202 Paresthesia of skin: Secondary | ICD-10-CM | POA: Insufficient documentation

## 2023-08-07 DIAGNOSIS — D631 Anemia in chronic kidney disease: Secondary | ICD-10-CM | POA: Insufficient documentation

## 2023-08-07 LAB — CBC
HCT: 37.2 % (ref 36.0–46.0)
Hemoglobin: 12.3 g/dL (ref 12.0–15.0)
MCH: 25.8 pg — ABNORMAL LOW (ref 26.0–34.0)
MCHC: 33.1 g/dL (ref 30.0–36.0)
MCV: 78.2 fL — ABNORMAL LOW (ref 80.0–100.0)
Platelets: 178 10*3/uL (ref 150–400)
RBC: 4.76 MIL/uL (ref 3.87–5.11)
RDW: 15.7 % — ABNORMAL HIGH (ref 11.5–15.5)
WBC: 7.4 10*3/uL (ref 4.0–10.5)
nRBC: 0 % (ref 0.0–0.2)

## 2023-08-07 LAB — BASIC METABOLIC PANEL
Anion gap: 13 (ref 5–15)
BUN: 41 mg/dL — ABNORMAL HIGH (ref 8–23)
CO2: 24 mmol/L (ref 22–32)
Calcium: 9.9 mg/dL (ref 8.9–10.3)
Chloride: 99 mmol/L (ref 98–111)
Creatinine, Ser: 6.83 mg/dL — ABNORMAL HIGH (ref 0.44–1.00)
GFR, Estimated: 6 mL/min — ABNORMAL LOW (ref >60)
Glucose, Bld: 107 mg/dL — ABNORMAL HIGH (ref 70–99)
Potassium: 4.4 mmol/L (ref 3.5–5.1)
Sodium: 136 mmol/L (ref 135–145)

## 2023-08-07 LAB — ECHOCARDIOGRAM COMPLETE
Area-P 1/2: 3.05 cm2
S' Lateral: 2.8 cm
Weight: 3435.65 [oz_av]

## 2023-08-07 LAB — HEPATITIS B SURFACE ANTIBODY, QUANTITATIVE: Hep B S AB Quant (Post): 544 m[IU]/mL

## 2023-08-07 LAB — MRSA NEXT GEN BY PCR, NASAL: MRSA by PCR Next Gen: NOT DETECTED

## 2023-08-07 LAB — MAGNESIUM: Magnesium: 2.4 mg/dL (ref 1.7–2.4)

## 2023-08-07 LAB — PHOSPHORUS: Phosphorus: 6.5 mg/dL — ABNORMAL HIGH (ref 2.5–4.6)

## 2023-08-07 MED ORDER — HEPARIN SODIUM (PORCINE) 1000 UNIT/ML IJ SOLN: 2000.0000 [IU] | Freq: Once | INTRAMUSCULAR | Status: AC

## 2023-08-07 MED ORDER — HEPARIN SODIUM (PORCINE) 1000 UNIT/ML DIALYSIS
2000.0000 [IU] | INTRAMUSCULAR | Status: AC | PRN
  Administered 2023-08-07: 2000 [IU] via INTRAVENOUS_CENTRAL
  Filled 2023-08-07: qty 2

## 2023-08-07 MED ORDER — ALTEPLASE 2 MG IJ SOLR
INTRAMUSCULAR | Status: AC
  Administered 2023-08-07: 3.2 mg
  Filled 2023-08-07: qty 2

## 2023-08-07 MED ORDER — NEPRO/CARBSTEADY PO LIQD: 237.0000 mL | ORAL | Status: DC | PRN

## 2023-08-07 MED ORDER — LIDOCAINE HCL (PF) 1 % IJ SOLN: 5.0000 mL | INTRAMUSCULAR | Status: DC | PRN

## 2023-08-07 MED ORDER — ANTICOAGULANT SODIUM CITRATE 4% (200MG/5ML) IV SOLN: 5.0000 mL | Status: DC | PRN

## 2023-08-07 MED ORDER — PENTAFLUOROPROP-TETRAFLUOROETH EX AERO: 1.0000 | INHALATION_SPRAY | CUTANEOUS | Status: DC | PRN

## 2023-08-07 MED ORDER — HEPARIN SODIUM (PORCINE) 1000 UNIT/ML DIALYSIS
4000.0000 [IU] | Freq: Once | INTRAMUSCULAR | Status: AC
  Administered 2023-08-07: 4000 [IU] via INTRAVENOUS_CENTRAL
  Filled 2023-08-07: qty 4

## 2023-08-07 MED ORDER — LIDOCAINE-PRILOCAINE 2.5-2.5 % EX CREA: 1.0000 | TOPICAL_CREAM | CUTANEOUS | Status: DC | PRN

## 2023-08-07 MED ORDER — HYDRALAZINE HCL 50 MG PO TABS
50.0000 mg | ORAL_TABLET | Freq: Three times a day (TID) | ORAL | Status: DC
  Administered 2023-08-07: 50 mg via ORAL
  Filled 2023-08-07: qty 1

## 2023-08-07 MED ORDER — HEPARIN SODIUM (PORCINE) 1000 UNIT/ML DIALYSIS
1000.0000 [IU] | INTRAMUSCULAR | Status: DC | PRN
  Administered 2023-08-08: 1000 [IU]
  Filled 2023-08-07: qty 1

## 2023-08-07 MED ORDER — CHLORHEXIDINE GLUCONATE CLOTH 2 % EX PADS
6.0000 | MEDICATED_PAD | Freq: Every day | CUTANEOUS | Status: DC
  Administered 2023-08-08 – 2023-08-09 (×2): 6 via TOPICAL

## 2023-08-07 MED ORDER — HYDRALAZINE HCL 50 MG PO TABS
75.0000 mg | ORAL_TABLET | Freq: Three times a day (TID) | ORAL | Status: DC
  Administered 2023-08-07 – 2023-08-08 (×3): 75 mg via ORAL
  Filled 2023-08-07 (×3): qty 1

## 2023-08-07 MED ORDER — HEPARIN SODIUM (PORCINE) 1000 UNIT/ML IJ SOLN
3200.0000 [IU] | Freq: Once | INTRAMUSCULAR | Status: DC
  Filled 2023-08-07: qty 4

## 2023-08-07 MED ORDER — PERFLUTREN LIPID MICROSPHERE
1.0000 mL | INTRAVENOUS | Status: AC | PRN
  Administered 2023-08-07: 3 mL via INTRAVENOUS
  Filled 2023-08-07: qty 10

## 2023-08-07 MED ORDER — ALTEPLASE 2 MG IJ SOLR
2.0000 mg | Freq: Once | INTRAMUSCULAR | Status: DC | PRN
  Filled 2023-08-07: qty 2

## 2023-08-07 NOTE — Progress Notes (Signed)
NAME:  Claudell Linquist, MRN:  [id number], DOB:  01/26/1956, LOS: 1 ADMISSION DATE:  08/06/2023, CONSULTATION DATE:  9/30 REFERRING MD:  Tegeler, CHIEF COMPLAINT:  hypertensive emergency and PRES   History of Present Illness:  This is a pleasant 67 year old female patient with end-stage renal disease and hypertension.  Also has had 2 failed dialysis access sites the left fistula, and failed right graft with associated significant nerve discomfort undergoes dialysis on Monday/Wednesday/Fridays.  Was in usual state of health until 9/28 when noted new onset of headache and intermittent chest discomfort.  The onset of the chest discomfort was not related to activity, and primarily over the right side of the chest, there was no neck or left arm discomfort however the headache persisted.  She has had no change in medications.  She has been taking her medications as prescribed.  Her dry weight has been stable at 98.5 kg.  She presented to dialysis on 9/30 was about 1 hour into therapy noting fairly significant headache and just not feeling well.  She became progressively confused and EMS was called.  On arrival blood pressure was 223/113.  Glucose 67, a code stroke was initially called and she was emergently brought to CT scanner.  This was negative, a subsequent MRI was obtained this showed T2 flair with hyperintense signal at bilateral occipital and posterior temporal lobes consistent with PRES, she was started on a Cleviprex drip, and critical care asked to admit.   Pertinent  Medical History  Hypertension, end-stage renal disease on Monday/Wednesday/Friday schedule dry weight 98.5 kg Thrombosed left upper arm fistula status post thrombectomy and surgical repair New right AV graft w/ sig pain felt 2/2 nerve pain reversed on 9/17 IV tunneled cath on right side Anemia of renal disease, hyperparathyroidism, depression, postoperative nausea vomited Cobos sleep apnea not on CPAP, anxiety.  Significant Hospital  Events: Including procedures, antibiotic start and stop dates in addition to other pertinent events   Admitted with press on 9/30  Interim History / Subjective:  No events.  No further headaches.  Objective   Blood pressure (!) 151/72, pulse 79, temperature 98.7 F (37.1 C), temperature source Oral, resp. rate 19, weight 97.7 kg, SpO2 99%.        Intake/Output Summary (Last 24 hours) at 08/07/2023 0703 Last data filed at 08/07/2023 0400 Gross per 24 hour  Intake 149.74 ml  Output 200 ml  Net -50.26 ml   Filed Weights   08/06/23 2045 08/07/23 0347  Weight: 97.8 kg 97.7 kg    Examination: No distress Tracking, pupils equal Moves all 4 ext to command Lungs clear, heart sounds regular Ext without edema Tunneled RIJ HD line looks okay Aox3, pleasant affect  CBC and BMP reviewed CXR neg  Resolved Hospital Problem list     Assessment & Plan:   Hypertensive emergency w/ PRES End-stage renal disease Anemia in the setting of end-stage renal disease Right arm pain and paresthesia.  Status post removal of AVG on 9/17 due to steal symptoms.  VVS: nothing urgent diagnostic to do  - Add hydralazine, continue amlodipine, metoprolol - Cleviprex for SBP < 140 - iHD per nephrology - Okay for transfer out vs. Home once off cleviprex  Best Practice (right click and "Reselect all SmartList Selections" daily)   Diet/type: Regular consistency (see orders) DVT prophylaxis: prophylactic heparin  GI prophylaxis: N/A Lines: N/A Foley:  N/A Code Status:  full code Last date of multidisciplinary goals of care discussion [pending]  31 min cc time  Myrla Halsted MD PCCM

## 2023-08-07 NOTE — Progress Notes (Signed)
Received patient in bed to unit.  Alert and oriented.  Informed consent signed and in chart.   TX duration: 2 hours and 19 minutes, treament terminated early due to catheter malfunctioning.  Patient tolerated well.  Transported back to the room  Alert, without acute distress.  Hand-off given to patient's nurse.   Access used: Right HD cath Access issues: Patient catheter had high arterial pressures throughout treatment.  Per Dr. Arta Silence instilled Cathflo to dwell.  Total UF removed: Medication(s) given: none   08/07/23 1049  Vitals  Temp 98.6 F (37 C)  Temp Source Oral  BP (!) 150/76  MAP (mmHg) 98  BP Location Left Leg  BP Method Automatic  Patient Position (if appropriate) Lying  Pulse Rate 70  Pulse Rate Source Monitor  ECG Heart Rate 69  Resp 18  MEWS COLOR  MEWS Score Color Green  Oxygen Therapy  SpO2 100 %  O2 Device Nasal Cannula  O2 Flow Rate (L/min) 2 L/min  MEWS Score  MEWS Temp 0  MEWS Systolic 0  MEWS Pulse 0  MEWS RR 0  MEWS LOC 0  MEWS Score 0     Stacie Glaze LPN [facility]

## 2023-08-07 NOTE — Progress Notes (Signed)
Pt receives out-pt HD at [facility] on MWF. Will assist as needed.   Kohlton Gilpatrick Renal Navigator [phone] 

## 2023-08-07 NOTE — Progress Notes (Signed)
    Patient evaluated for continued right hand numbness.  Likely etiology is IMN.  Will make outpatient referral to neurology and she has scheduled follow-up with [place] for wound check.  Annelies Coyt C. Randie Heinz, MD [facility] Office: [phone] Pager: [phone]

## 2023-08-07 NOTE — Discharge Summary (Signed)
Physician Discharge Summary         Patient ID: Christina Vasquez MRN: [id number] DOB/AGE: 1956/04/05 67 y.o.  Admit date: 08/06/2023 Discharge date: 08/09/2023  Discharge Diagnoses:    Active Hospital Problems   Diagnosis Date Noted   Hypertensive emergency 08/06/2023   PRES (posterior reversible encephalopathy syndrome) 08/07/2023   ESRD on hemodialysis (HCC) 08/07/2023   OSA (obstructive sleep apnea) 08/07/2023   Anemia associated with chronic renal failure 08/07/2023   Arm paresthesia, right 08/07/2023    Resolved Hospital Problems  No resolved problems to display.      Discharge summary    This is a pleasant 67 year old female patient with end-stage renal disease and hypertension. Also has had 2 failed dialysis access sites the left fistula, and failed right graft with associated significant nerve discomfort undergoes dialysis on Monday/Wednesday/Fridays. Was in usual state of health until 9/28 when noted new onset of headache and intermittent chest discomfort. The onset of the chest discomfort was not related to activity, and primarily over the right side of the chest, there was no neck or left arm discomfort however the headache persisted. She has had no change in medications. She has been taking her medications as prescribed. Her dry weight has been stable at 98.5 kg. She presented to dialysis on 9/30 was about 1 hour into therapy noting fairly significant headache and just not feeling well. She became progressively confused and EMS was called. On arrival blood pressure was 223/113. Glucose 67, a code stroke was initially called and she was emergently brought to CT scanner. This was negative, a subsequent MRI was obtained this showed T2 flair with hyperintense signal at bilateral occipital and posterior temporal lobes consistent with PRES, she was started on a Cleviprex drip, and critical care asked to admit.   Hospital course  9/30 admitted. Placed on Cleviprex, initial BP goal  < 160. Started back her orals w/ escalation of her metoprolol dosing. Nephro consulted. Also spoke w/ vascular surgery about arm paresthesia on right  10/1 still on cleviprex BP goal now aiming for <140, getting dialysis. Added hydralazine  10/2 HD inpatient. Tolerated well from a BP standpoint. Some labile BP previously in the day  Discharge Plan by Active Problems    Hypertensive emergency w/ PRES Course complicated by labile blood pressures, high and low. Very sensitive.  Plan Home antihypertensive regimen modified: Amlodipine, Coreg, Hydralazine Hold coreg and hydralazine on dialysis mornings Only take on dialysis evenings if systolic blood pressure is greater than 110 mmHg Ambulatory referral to neurology requested.   ESRD. Dry wt 98.5 kg, M/W/F schedule Plan Home to resume her typical schedule   Traumatic paresthesia right upper extremity.  Plan Neurology outpatient referral requested  Left subclavian DVT - Eliquis x 3 months then repeat LUE [place], if clot is gone can be off [facility]  - VTE clinic follow up    Discharge Exam: BP 130/71   Pulse 87   Temp 98.4 F (36.9 C) (Oral)   Resp 15   Wt 98 kg   SpO2 97%   BMI 39.52 kg/m   Middle aged female in NAD PERRL no JVD R hand numbness unchanged Clear bilateral breath sounds RRR, no MRG  Labs at discharge   Lab Results  Component Value Date   CREATININE 6.02 (H) 08/08/2023   BUN 36 (H) 08/08/2023   NA 134 (L) 08/08/2023   K 4.1 08/08/2023   CL 99 08/08/2023   CO2 24 08/08/2023   Lab Results  Component  Value Date   WBC 6.7 08/08/2023   HGB 11.0 (L) 08/08/2023   HCT 32.7 (L) 08/08/2023   MCV 80.5 08/08/2023   PLT 177 08/08/2023   Lab Results  Component Value Date   ALT 15 08/06/2023   AST 16 08/06/2023   ALKPHOS 73 08/06/2023   BILITOT 0.6 08/06/2023   Lab Results  Component Value Date   INR 1.1 08/06/2023    Current radiological studies    VAS [place] UPPER EXTREMITY VENOUS DUPLEX  Result Date:  08/08/2023 UPPER VENOUS STUDY  Patient Name:  DIAVIAN FURGASON  Date of Exam:   08/08/2023 Medical Rec #: [id number]        Accession #:    [id number] Date of Birth: June 26, 1956        Patient Gender: F Patient Age:   30 years Exam Location:  [facility] Procedure:      VAS [place] UPPER EXTREMITY VENOUS DUPLEX Referring Phys: Levon Hedger --------------------------------------------------------------------------------  Indications: Edema Other Indications: Prior right graft HD acces. Prior right HD access. Risk Factors: None identified. Limitations: Poor ultrasound/tissue interface and patient positioning, patient immobility. Comparison Study: No prior studies. Performing Technologist: Chanda Busing RVT  Examination Guidelines: A complete evaluation includes B-mode imaging, spectral Doppler, color Doppler, and power Doppler as needed of all accessible portions of each vessel. Bilateral testing is considered an integral part of a complete examination. Limited examinations for reoccurring indications may be performed as noted.  Right Findings: +----------+------------+---------+-----------+----------+-------+ RIGHT     CompressiblePhasicitySpontaneousPropertiesSummary +----------+------------+---------+-----------+----------+-------+ IJV           Full       Yes       Yes                      +----------+------------+---------+-----------+----------+-------+ Subclavian    Full       Yes       Yes                      +----------+------------+---------+-----------+----------+-------+ Axillary      Full       Yes       Yes                      +----------+------------+---------+-----------+----------+-------+ Brachial      Full                                          +----------+------------+---------+-----------+----------+-------+ Radial        Full                                          +----------+------------+---------+-----------+----------+-------+ Ulnar          Full                                          +----------+------------+---------+-----------+----------+-------+ Cephalic      None                                   Acute  +----------+------------+---------+-----------+----------+-------+ Basilic       Full                                          +----------+------------+---------+-----------+----------+-------+  Superficial thrombus detected in the cephalic vein is surrounding an IV in the mid forearm.  Left Findings: +----------+------------+---------+-----------+----------+-------+ LEFT      CompressiblePhasicitySpontaneousPropertiesSummary +----------+------------+---------+-----------+----------+-------+ IJV           Full       Yes       Yes                      +----------+------------+---------+-----------+----------+-------+ Subclavian    None       No        No                Acute  +----------+------------+---------+-----------+----------+-------+ Axillary      Full       Yes       Yes                      +----------+------------+---------+-----------+----------+-------+ Brachial      Full                                          +----------+------------+---------+-----------+----------+-------+ Radial        Full                                          +----------+------------+---------+-----------+----------+-------+ Ulnar         Full                                          +----------+------------+---------+-----------+----------+-------+ Cephalic      Full                                          +----------+------------+---------+-----------+----------+-------+ Basilic       Full                                          +----------+------------+---------+-----------+----------+-------+  Summary:  Right: No evidence of deep vein thrombosis in the upper extremity. Findings consistent with acute superficial vein thrombosis involving the right cephalic vein.  Superficial thrombus detected in the cephalic vein is surrounding an IV in the mid forearm.  Left: No evidence of superficial vein thrombosis in the upper extremity. Findings consistent with acute deep vein thrombosis involving the left subclavian vein.  *See table(s) above for measurements and observations.  Diagnosing physician: Sherald Hess MD Electronically signed by Sherald Hess MD on 08/08/2023 at 3:49:15 PM.    Final    ECHOCARDIOGRAM COMPLETE  Result Date: 08/07/2023    ECHOCARDIOGRAM REPORT   Patient Name:   PEYTIN DECHERT Date of Exam: 08/07/2023 Medical Rec #:  [id number]       Height:       62.0 in Accession #:    [id number]      Weight:       214.7 lb Date of Birth:  [birth year]       BSA:          1.970 m Patient Age:    67 years  Value Date   WBC 6.7 08/08/2023   HGB 11.0 (L) 08/08/2023   HCT 32.7 (L) 08/08/2023   MCV 80.5 08/08/2023   PLT 177 08/08/2023   Lab Results  Component Value Date   ALT 15 08/06/2023   AST 16 08/06/2023   ALKPHOS 73 08/06/2023   BILITOT 0.6 08/06/2023   Lab Results  Component Value Date   INR 1.1 08/06/2023    Current radiological studies    VAS [place] UPPER EXTREMITY VENOUS DUPLEX  Result Date:  08/08/2023 UPPER VENOUS STUDY  Patient Name:  DIAVIAN FURGASON  Date of Exam:   08/08/2023 Medical Rec #: [id number]        Accession #:    [id number] Date of Birth: June 26, 1956        Patient Gender: F Patient Age:   30 years Exam Location:  [facility] Procedure:      VAS [place] UPPER EXTREMITY VENOUS DUPLEX Referring Phys: Levon Hedger --------------------------------------------------------------------------------  Indications: Edema Other Indications: Prior right graft HD acces. Prior right HD access. Risk Factors: None identified. Limitations: Poor ultrasound/tissue interface and patient positioning, patient immobility. Comparison Study: No prior studies. Performing Technologist: Chanda Busing RVT  Examination Guidelines: A complete evaluation includes B-mode imaging, spectral Doppler, color Doppler, and power Doppler as needed of all accessible portions of each vessel. Bilateral testing is considered an integral part of a complete examination. Limited examinations for reoccurring indications may be performed as noted.  Right Findings: +----------+------------+---------+-----------+----------+-------+ RIGHT     CompressiblePhasicitySpontaneousPropertiesSummary +----------+------------+---------+-----------+----------+-------+ IJV           Full       Yes       Yes                      +----------+------------+---------+-----------+----------+-------+ Subclavian    Full       Yes       Yes                      +----------+------------+---------+-----------+----------+-------+ Axillary      Full       Yes       Yes                      +----------+------------+---------+-----------+----------+-------+ Brachial      Full                                          +----------+------------+---------+-----------+----------+-------+ Radial        Full                                          +----------+------------+---------+-----------+----------+-------+ Ulnar          Full                                          +----------+------------+---------+-----------+----------+-------+ Cephalic      None                                   Acute  +----------+------------+---------+-----------+----------+-------+ Basilic       Full                                          +----------+------------+---------+-----------+----------+-------+  Physician Discharge Summary         Patient ID: Christina Vasquez MRN: [id number] DOB/AGE: 1956/04/05 67 y.o.  Admit date: 08/06/2023 Discharge date: 08/09/2023  Discharge Diagnoses:    Active Hospital Problems   Diagnosis Date Noted   Hypertensive emergency 08/06/2023   PRES (posterior reversible encephalopathy syndrome) 08/07/2023   ESRD on hemodialysis (HCC) 08/07/2023   OSA (obstructive sleep apnea) 08/07/2023   Anemia associated with chronic renal failure 08/07/2023   Arm paresthesia, right 08/07/2023    Resolved Hospital Problems  No resolved problems to display.      Discharge summary    This is a pleasant 67 year old female patient with end-stage renal disease and hypertension. Also has had 2 failed dialysis access sites the left fistula, and failed right graft with associated significant nerve discomfort undergoes dialysis on Monday/Wednesday/Fridays. Was in usual state of health until 9/28 when noted new onset of headache and intermittent chest discomfort. The onset of the chest discomfort was not related to activity, and primarily over the right side of the chest, there was no neck or left arm discomfort however the headache persisted. She has had no change in medications. She has been taking her medications as prescribed. Her dry weight has been stable at 98.5 kg. She presented to dialysis on 9/30 was about 1 hour into therapy noting fairly significant headache and just not feeling well. She became progressively confused and EMS was called. On arrival blood pressure was 223/113. Glucose 67, a code stroke was initially called and she was emergently brought to CT scanner. This was negative, a subsequent MRI was obtained this showed T2 flair with hyperintense signal at bilateral occipital and posterior temporal lobes consistent with PRES, she was started on a Cleviprex drip, and critical care asked to admit.   Hospital course  9/30 admitted. Placed on Cleviprex, initial BP goal  < 160. Started back her orals w/ escalation of her metoprolol dosing. Nephro consulted. Also spoke w/ vascular surgery about arm paresthesia on right  10/1 still on cleviprex BP goal now aiming for <140, getting dialysis. Added hydralazine  10/2 HD inpatient. Tolerated well from a BP standpoint. Some labile BP previously in the day  Discharge Plan by Active Problems    Hypertensive emergency w/ PRES Course complicated by labile blood pressures, high and low. Very sensitive.  Plan Home antihypertensive regimen modified: Amlodipine, Coreg, Hydralazine Hold coreg and hydralazine on dialysis mornings Only take on dialysis evenings if systolic blood pressure is greater than 110 mmHg Ambulatory referral to neurology requested.   ESRD. Dry wt 98.5 kg, M/W/F schedule Plan Home to resume her typical schedule   Traumatic paresthesia right upper extremity.  Plan Neurology outpatient referral requested  Left subclavian DVT - Eliquis x 3 months then repeat LUE [place], if clot is gone can be off [facility]  - VTE clinic follow up    Discharge Exam: BP 130/71   Pulse 87   Temp 98.4 F (36.9 C) (Oral)   Resp 15   Wt 98 kg   SpO2 97%   BMI 39.52 kg/m   Middle aged female in NAD PERRL no JVD R hand numbness unchanged Clear bilateral breath sounds RRR, no MRG  Labs at discharge   Lab Results  Component Value Date   CREATININE 6.02 (H) 08/08/2023   BUN 36 (H) 08/08/2023   NA 134 (L) 08/08/2023   K 4.1 08/08/2023   CL 99 08/08/2023   CO2 24 08/08/2023   Lab Results  Component  Value Date   WBC 6.7 08/08/2023   HGB 11.0 (L) 08/08/2023   HCT 32.7 (L) 08/08/2023   MCV 80.5 08/08/2023   PLT 177 08/08/2023   Lab Results  Component Value Date   ALT 15 08/06/2023   AST 16 08/06/2023   ALKPHOS 73 08/06/2023   BILITOT 0.6 08/06/2023   Lab Results  Component Value Date   INR 1.1 08/06/2023    Current radiological studies    VAS [place] UPPER EXTREMITY VENOUS DUPLEX  Result Date:  08/08/2023 UPPER VENOUS STUDY  Patient Name:  DIAVIAN FURGASON  Date of Exam:   08/08/2023 Medical Rec #: [id number]        Accession #:    [id number] Date of Birth: June 26, 1956        Patient Gender: F Patient Age:   30 years Exam Location:  [facility] Procedure:      VAS [place] UPPER EXTREMITY VENOUS DUPLEX Referring Phys: Levon Hedger --------------------------------------------------------------------------------  Indications: Edema Other Indications: Prior right graft HD acces. Prior right HD access. Risk Factors: None identified. Limitations: Poor ultrasound/tissue interface and patient positioning, patient immobility. Comparison Study: No prior studies. Performing Technologist: Chanda Busing RVT  Examination Guidelines: A complete evaluation includes B-mode imaging, spectral Doppler, color Doppler, and power Doppler as needed of all accessible portions of each vessel. Bilateral testing is considered an integral part of a complete examination. Limited examinations for reoccurring indications may be performed as noted.  Right Findings: +----------+------------+---------+-----------+----------+-------+ RIGHT     CompressiblePhasicitySpontaneousPropertiesSummary +----------+------------+---------+-----------+----------+-------+ IJV           Full       Yes       Yes                      +----------+------------+---------+-----------+----------+-------+ Subclavian    Full       Yes       Yes                      +----------+------------+---------+-----------+----------+-------+ Axillary      Full       Yes       Yes                      +----------+------------+---------+-----------+----------+-------+ Brachial      Full                                          +----------+------------+---------+-----------+----------+-------+ Radial        Full                                          +----------+------------+---------+-----------+----------+-------+ Ulnar          Full                                          +----------+------------+---------+-----------+----------+-------+ Cephalic      None                                   Acute  +----------+------------+---------+-----------+----------+-------+ Basilic       Full                                          +----------+------------+---------+-----------+----------+-------+

## 2023-08-07 NOTE — Discharge Planning (Signed)
[facility] Patient Discharge Orders- [facility] CLINIC: [facility]  Patient's name: Christina Vasquez Admit/DC Dates: 08/06/2023 - 08/07/23  Discharge Diagnoses: Hypertensive emergency w/PRES   AMS 2/2 to #1  Aranesp: Given: no    Last Hgb: 12.3 PRBC's Given: no ESA dose for discharge: no change IV Iron dose at discharge: no change  Heparin change: no  EDW Change: yes New EDW: 97.5kg  Bath Change: no  Access intervention/Change: yes Details: cathflo dwell overnight post HD on 08/07/23  Hectorol/Calcitriol change: no  Discharge Labs: Calcium 9.9 Phosphorus 6.5 Albumin 3.5 K+ 4.4  IV Antibiotics: no Details:  On Coumadin?: no Last INR: Next INR: Managed By:   OTHER/APPTS/LAB ORDERS: Metoprolol increased to 100mg  every day, hydralazine 50mg  TID added.      D/C Meds to be reconciled by nurse after every discharge.  Completed JO:[email or web address] Kru Allman, PA-C   Reviewed by: MD:______ RN_______

## 2023-08-07 NOTE — Progress Notes (Signed)
[facility] Progress Note   Subjective:   Patient seen and examined in room during dialysis.  Tolerating treatment well but TDC is positional and alarming a lot. Blood pressure better, still on cleviprex drip.  Believes she may have missed her BP medication Sunday night, she could not remember if she took it, and did not want to take it twice, so she doesn't think she took it at all. Reports she does not have a BP cuff at home to measure her BP.  Overall feeling a lot better today.  Headache and confusion resolved.  Denies CP, SOB, abdominal pain and n/v/d.  Admits to decreased appetite over the last couple weeks with likely weight loss.  Continues to struggle with function in R hand and trying to learn to do things with the left.  No other specific complaints.   Objective Vitals:   08/07/23 1118 08/07/23 1130 08/07/23 1145 08/07/23 1200  BP:  (!) 147/76 (!) 150/84 (!) 150/87  Pulse:  77 80 81  Resp:  19 (!) 23 20  Temp:      TempSrc:      SpO2:  100% 98% 100%  Weight: 97.4 kg      Physical Exam General:well appearing, pleasant female in NAD Heart:RRR, no mrg Lungs:CTAB anterolaterally  Abdomen:soft, NTND Extremities:no LE edema Dialysis Access: [facility]   Filed Weights   08/07/23 0347 08/07/23 0740 08/07/23 1118  Weight: 97.7 kg 96.9 kg 97.4 kg    Intake/Output Summary (Last 24 hours) at 08/07/2023 1218 Last data filed at 08/07/2023 1200 Gross per 24 hour  Intake 264.74 ml  Output 600 ml  Net -335.26 ml    Additional Objective Labs: Basic Metabolic Panel: Recent Labs  Lab 08/06/23 1100 08/06/23 1108 08/06/23 1752 08/07/23 0341  NA 134* 138  --  136  K 4.1 4.3  --  4.4  CL 96* 102  --  99  CO2 24  --   --  24  GLUCOSE 102* 100*  --  107*  BUN 34* 35*  --  41*  CREATININE 5.91* 6.40* 6.28* 6.83*  CALCIUM 8.8*  --   --  9.9  PHOS  --   --   --  6.5*   Liver Function Tests: Recent Labs  Lab 08/06/23 1100  AST 16  ALT 15  ALKPHOS 73  BILITOT 0.6   PROT 6.6  ALBUMIN 3.5   CBC: Recent Labs  Lab 08/06/23 1100 08/06/23 1108 08/06/23 1752 08/07/23 0341  WBC 6.1  --  6.9 7.4  NEUTROABS 4.7  --   --   --   HGB 10.5* 10.9* 10.5* 12.3  HCT 32.8* 32.0* 32.3* 37.2  MCV 80.6  --  79.6* 78.2*  PLT 137*  --  171 178    Studies/Results: MR ANGIO NECK WO CONTRAST  Result Date: 08/06/2023 CLINICAL DATA:  Neuro deficit, acute, stroke suspected EXAM: MRA NECK WITHOUT CONTRAST TECHNIQUE: Angiographic images of the neck were acquired using MRA technique without intravenous contrast. Carotid stenosis measurements (when applicable) are obtained utilizing NASCET criteria, using the distal internal carotid diameter as the denominator. COMPARISON:  None Available. FINDINGS: Aortic arch: Standard three-vessel branch pattern. Normal flow signal. Right carotid system: Normal flow signal. No focal occlusion. No significant stenosis. Left carotid system: Normal flow signal. No focal occlusion. No significant stenosis Vertebral arteries: The V1 segment of the left vertebral artery is poorly visualized due to respiratory motion artifact. Within this limitation, normal flow signal. No focal occlusion. No  significant stenosis. Other: None. IMPRESSION: No large vessel occlusion or hemodynamically significant stenosis in the neck. Electronically Signed   By: Lorenza Cambridge M.D.   On: 08/06/2023 12:49   DG Chest Portable 1 View  Result Date: 08/06/2023 CLINICAL DATA:  67 year old female with chest pain.  Dialysis. EXAM: PORTABLE CHEST 1 VIEW COMPARISON:  Chest radiographs 06/09/2015. FINDINGS: Portable AP semi upright view at 1045 hours. Right chest dual lumen dialysis type catheter with no adverse features. Cardiomegaly. Tortuous thoracic aorta. Other mediastinal contours are within normal limits. Visualized tracheal air column is within normal limits. Lung volumes are within normal limits. Allowing for portable technique the lungs are clear. No pneumothorax. Partially  visible bilateral shoulder arthroplasty. Left subclavian vascular stent also in place. IMPRESSION: 1. Right IJ dialysis type catheter with no adverse features. 2. Cardiomegaly and tortuous thoracic aorta. No acute cardiopulmonary abnormality. Electronically Signed   By: Odessa Fleming M.D.   On: 08/06/2023 11:58   MR BRAIN WO CONTRAST  Result Date: 08/06/2023 CLINICAL DATA:  Neuro deficit, acute, stroke suspected EXAM: MRI HEAD WITHOUT CONTRAST MRA HEAD WITHOUT CONTRAST TECHNIQUE: Multiplanar, multiecho pulse sequences of the brain and surrounding structures were obtained without intravenous contrast. Multiplanar, multi-echo pulse sequences of the brain and surrounding structures were acquired without and with intravenous contrast. Angiographic images of the Circle of Willis were acquired using MRA technique without intravenous contrast. COMPARISON:  None Available. FINDINGS: Brain: Negative for acute infarct. No hemorrhage. No hydrocephalus. No extra-axial fluid collection. There is sequela of mild chronic microvascular ischemic change. There is T2/stir hyperintense signal in the bilateral occipital and posterior temporal lobes (series 4, image 17-14). 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 There is an additional lesion that is along the splenium of the corpus callosum on the right (series 4, image 20). Enlarged and partially empty sella. Vascular: Normal flow voids. Skull and upper cervical spine: Normal marrow signal. Sinuses/Orbits: Negative. Other: None. MRA HEAD FINDINGS Anterior circulation: No aneurysm. No proximal occlusion. No significant stenosis. Posterior circulation: No aneurysm. No proximal occlusion. No significant stenosis. Anatomic variants: Fetal PCA on the right IMPRESSION: 1. Negative for an acute infarct. 2. T2/FLAIR hyperintense signal in the bilateral occipital and posterior temporal lobes are favored to  represent findings related to PRES. 3. Additional scattered areas of cortical T2/FLAIR hyperintense are favored to represent chronic infarcts. One of the lesions involves the splenium of the corpus callosum on the right. Further evaluation with a contrast enhanced brain MRI could be considered for more definitive characterization. 4. No intracranial large vessel occlusion. Electronically Signed   By: Lorenza Cambridge M.D.   On: 08/06/2023 10:29   MR ANGIO HEAD WO CONTRAST  Result Date: 08/06/2023 CLINICAL DATA:  Neuro deficit, acute, stroke suspected EXAM: MRI HEAD WITHOUT CONTRAST MRA HEAD WITHOUT CONTRAST TECHNIQUE: Multiplanar, multiecho pulse sequences of the brain and surrounding structures were obtained without intravenous contrast. Multiplanar, multi-echo pulse sequences of the brain and surrounding structures were acquired without and with intravenous contrast. Angiographic images of the Circle of Willis were acquired using MRA technique without intravenous contrast. COMPARISON:  None Available. FINDINGS: Brain: Negative for acute infarct. No hemorrhage. No hydrocephalus. No extra-axial fluid collection. There is sequela of mild chronic microvascular ischemic change. There is T2/stir hyperintense signal in the bilateral occipital and posterior temporal lobes (series 4, image 17-14). 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  There is an additional lesion that is along the splenium of the corpus callosum on the right (series 4, image 20). Enlarged and partially empty sella. Vascular: Normal flow voids. Skull and upper cervical spine: Normal marrow signal. Sinuses/Orbits: Negative. Other: None. MRA HEAD FINDINGS Anterior circulation: No aneurysm. No proximal occlusion. No significant stenosis. Posterior circulation: No aneurysm. No proximal occlusion. No significant stenosis. Anatomic variants: Fetal PCA on the right  IMPRESSION: 1. Negative for an acute infarct. 2. T2/FLAIR hyperintense signal in the bilateral occipital and posterior temporal lobes are favored to represent findings related to PRES. 3. Additional scattered areas of cortical T2/FLAIR hyperintense are favored to represent chronic infarcts. One of the lesions involves the splenium of the corpus callosum on the right. Further evaluation with a contrast enhanced brain MRI could be considered for more definitive characterization. 4. No intracranial large vessel occlusion. Electronically Signed   By: Lorenza Cambridge M.D.   On: 08/06/2023 10:29   CT HEAD CODE STROKE WO CONTRAST  Result Date: 08/06/2023 CLINICAL DATA:  Code stroke. 67 year old female (not otherwise specified at the time of this report). EXAM: CT HEAD WITHOUT CONTRAST TECHNIQUE: Contiguous axial images were obtained from the base of the skull through the vertex without intravenous contrast. RADIATION DOSE REDUCTION: This exam was performed according to the departmental dose-optimization program which includes automated exposure control, adjustment of the mA and/or kV according to patient size and/or use of iterative reconstruction technique. COMPARISON:  None Available. FINDINGS: Brain: No midline shift, mass effect, or evidence of intracranial mass lesion. No ventriculomegaly. No acute intracranial hemorrhage identified. Gray-white differentiation within normal limits for age throughout the brain. No acute or chronic cortically based infarct identified. Small left coronal fissure cyst, normal variant. Vascular: No suspicious intracranial vascular hyperdensity. Calcified atherosclerosis at the skull base. Skull: No acute osseous abnormality identified. Sinuses/Orbits: Visualized paranasal sinuses and mastoids are clear. Other: No gaze deviation.  No acute scalp soft tissue finding. ASPECTS [facility] Stroke Program Early CT Score) Total score (0-10 with 10 being normal): 10 IMPRESSION: 1. Normal for age  noncontrast CT appearance of the brain. ASPECTS 10. 2. These results were communicated to Dr. Otelia Limes at 8:51 am on 08/06/2023 by text page via the [facility] messaging system. Electronically Signed   By: Odessa Fleming M.D.   On: 08/06/2023 08:51    Medications:  sodium chloride     anticoagulant sodium citrate     clevidipine 6 mg/hr (08/07/23 1200)    amLODipine  10 mg Oral Daily   Chlorhexidine Gluconate Cloth  6 each Topical Q0600   [START ON 08/08/2023] cinacalcet  60 mg Oral Q M,W,F-HD   folic acid  1 mg Oral Daily   heparin  5,000 Units Subcutaneous Q8H   heparin sodium (porcine)  3,200 Units Intracatheter Once   hydrALAZINE  50 mg Oral Q8H   metoprolol tartrate  100 mg Oral BID   sodium chloride flush  3 mL Intravenous Q12H   sucroferric oxyhydroxide  1,500 mg Oral TID WC   venlafaxine XR  37.5 mg Oral Q breakfast    Dialysis Orders: MWF [facility]  3.5h  400/1.5   98.5kg   2/2 bath   TDC RIJ   Heparin 7000+ prn - last HD 9/30 this am, got 1 hr HD and then sent to HD       Assessment/ Plan: AMS - 2/2 hypertensive emergency with PRES on CT Hypertensive emergency w/PRES - required cleviprex drip.  Hydralazine 50mg  TID added  to home regimen of amlodipine 10mg  every day, and metoprolol 100mg qd. Admits to missing meds occasionally due to being unsure if she already took them.  Discussed using pill box to help ensure she takes her medication properly.  Also asked to get BP cuff and keep BP diary at home to bring with her to HD to ensure her BP is well controlled.  ESRD - on HD MWF. Shortened HD yesterday due to acute illness. HD this AM to complete treatment.  Plan for HD again tomorrow per regular schedule.  Cathflo dwell in [facility] d/t not functioning properly.   Volume - LE edema on exam.  Standing weight under dry today.  Came in at dry weight yesterday.  Suspect weight loss with recent decrease in appetite, lower dry weight on d/c.   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 No esa needs.  HD  access - had her L AVF ligated in the summer to due painful aneuryms. Then had AVG placed 9/17, but then AVG was removed 9/24 due to steal symptoms. Continues to be cath-dependent. Per [facility].     Virgina Norfolk, PA-C [facility] 08/07/2023,12:18 PM  LOS: 1 day

## 2023-08-07 NOTE — Plan of Care (Signed)
  Problem: Activity: Goal: Ability to tolerate increased activity will improve Outcome: Progressing   Problem: Clinical Measurements: Goal: Ability to maintain clinical measurements within normal limits will improve Outcome: Progressing

## 2023-08-07 NOTE — Progress Notes (Signed)
   08/07/23 1049  Vitals  Temp 98.6 F (37 C)  Temp Source Oral  BP (!) 150/76  MAP (mmHg) 98  BP Location Left Leg  BP Method Automatic  Patient Position (if appropriate) Lying  Pulse Rate 70  Pulse Rate Source Monitor  ECG Heart Rate 69  Resp 18  Oxygen Therapy  SpO2 100 %  O2 Device Nasal Cannula  O2 Flow Rate (L/min) 2 L/min  During Treatment Monitoring  HD Safety Checks Performed Yes  Intra-Hemodialysis Comments  (tx terminated per Dr. Arlean Hopping due to catheter not functioning properly. Will instill cathflow)  Dialysis Fluid Bolus Normal Saline  Bolus Amount (mL) 300 mL

## 2023-08-07 NOTE — Progress Notes (Signed)
Cathflow placed in the catheter lumens with a label to dwell til next tx. Per Dr Arlean Hopping.

## 2023-08-07 NOTE — Progress Notes (Signed)
Called to get nursing report for dialysis spoke with lindsey assigned nurse will call back to report

## 2023-08-07 NOTE — Progress Notes (Signed)
Subjective: Awake and alert. States that she feels much better.   Objective: Current vital signs: BP (!) 170/81   Pulse 74   Temp 98.6 F (37 C) (Oral)   Resp 18   Wt 97.4 kg Comment: standing  SpO2 95%   BMI 39.27 kg/m  Vital signs in last 24 hours: Temp:  [98.6 F (37 C)-100.3 F (37.9 C)] 98.6 F (37 C) (10/01 1049) Pulse Rate:  [69-107] 74 (10/01 1615) Resp:  [12-33] 18 (10/01 1615) BP: (122-186)/(67-91) 170/81 (10/01 1615) SpO2:  [93 %-100 %] 95 % (10/01 1615) Weight:  [96.9 kg-97.8 kg] 97.4 kg (10/01 1118)  Intake/Output from previous day: 09/30 0701 - 10/01 0700 In: 193 [I.V.:193] Out: 200 [Urine:200] Intake/Output this shift: Total I/O In: 94.9 [I.V.:94.9] Out: 400 [Other:400] Nutritional status:  Diet Order             Diet renal with fluid restriction Fluid restriction: 1200 mL Fluid; Room service appropriate? Yes; Fluid consistency: Thin  Diet effective now                  Physical Exam  HEENT-  Sebastian/AT    Lungs- Respirations unlabored Extremities- Right upper extremity postsurgical changes noted.   Neurological Examination Mental Status: Alert, oriented x 5, thought content appropriate.  Speech fluent without evidence of aphasia.  Able to follow all commands without difficulty. Cranial Nerves: II: Temporal visual fields intact with no extinction to DSS. PERRL Able to read examiner's badge from 18 inches away without glasses.  III,IV, VI: No ptosis. EOMI.  V: Temp sensation equal bilaterally  VII: Smile symmetric VIII: Hearing intact to voice IX,X: No hoarseness XI: Symmetric shoulder shrug XII: Midline tongue extension Motor: LUE 5/5 proximally and distally RUE 4/5 distally with pain secondary to recent surgery BLE 5/5 proximally and distally  No pronator drift.  Sensory: Decreased FT sensation to palm of right hand. Intact FT sensation to LUE and BLE Deep Tendon Reflexes: Deferred Cerebellar: No ataxia noted.  Gait: Deferred    Lab  Results: Results for orders placed or performed during the hospital encounter of 08/06/23 (from the past 48 hour(s))  CBG monitoring, ED     Status: None   Collection Time: 08/06/23  8:23 AM  Result Value Ref Range   Glucose-Capillary 76 70 - 99 mg/dL    Comment: Glucose reference range applies only to samples taken after fasting for at least 8 hours.  Urine rapid drug screen (hosp performed)     Status: None   Collection Time: 08/06/23  8:35 AM  Result Value Ref Range   Opiates NONE DETECTED NONE DETECTED   Cocaine NONE DETECTED NONE DETECTED   Benzodiazepines NONE DETECTED NONE DETECTED   Amphetamines NONE DETECTED NONE DETECTED   Tetrahydrocannabinol NONE DETECTED NONE DETECTED   Barbiturates NONE DETECTED NONE DETECTED    Comment: (NOTE) DRUG SCREEN FOR MEDICAL PURPOSES ONLY.  IF CONFIRMATION IS NEEDED FOR ANY PURPOSE, NOTIFY LAB WITHIN 5 DAYS.  LOWEST DETECTABLE LIMITS FOR URINE DRUG SCREEN Drug Class                     Cutoff (ng/mL) Amphetamine and metabolites    1000 Barbiturate and metabolites    200 Benzodiazepine                 200 Opiates and metabolites        300 Cocaine and metabolites        300 THC  50 Performed at [facility], [address] [place], [place] [address]   Urinalysis, Routine w reflex microscopic -Urine, Clean Catch     Status: Abnormal   Collection Time: 08/06/23  8:35 AM  Result Value Ref Range   Color, Urine STRAW (A) YELLOW   APPearance CLEAR CLEAR   Specific Gravity, Urine 1.008 1.005 - 1.030   pH 9.0 (H) 5.0 - 8.0   Glucose, UA 50 (A) NEGATIVE mg/dL   Hgb urine dipstick NEGATIVE NEGATIVE   Bilirubin Urine NEGATIVE NEGATIVE   Ketones, ur NEGATIVE NEGATIVE mg/dL   Protein, ur 191 (A) NEGATIVE mg/dL   Nitrite NEGATIVE NEGATIVE   Leukocytes,Ua NEGATIVE NEGATIVE   RBC / HPF 0-5 0 - 5 RBC/hpf   WBC, UA 0-5 0 - 5 WBC/hpf   Bacteria, UA RARE (A) NONE SEEN   Squamous Epithelial / HPF 0-5 0 - 5 /HPF     Comment: Performed at [facility], [address] [place], [place] [address]  CBG monitoring, ED     Status: Abnormal   Collection Time: 08/06/23  8:55 AM  Result Value Ref Range   Glucose-Capillary 67 (L) 70 - 99 mg/dL    Comment: Glucose reference range applies only to samples taken after fasting for at least 8 hours.  CBG monitoring, ED     Status: None   Collection Time: 08/06/23  9:07 AM  Result Value Ref Range   Glucose-Capillary 87 70 - 99 mg/dL    Comment: Glucose reference range applies only to samples taken after fasting for at least 8 hours.  CBG monitoring, ED     Status: Abnormal   Collection Time: 08/06/23 10:50 AM  Result Value Ref Range   Glucose-Capillary 101 (H) 70 - 99 mg/dL    Comment: Glucose reference range applies only to samples taken after fasting for at least 8 hours.  Ethanol     Status: None   Collection Time: 08/06/23 11:00 AM  Result Value Ref Range   Alcohol, Ethyl (B) <10 <10 mg/dL    Comment: (NOTE) Lowest detectable limit for serum alcohol is 10 mg/dL.  For medical purposes only. Performed at [facility], [address], [place], [place] [address]   Protime-INR     Status: None   Collection Time: 08/06/23 11:00 AM  Result Value Ref Range   Prothrombin Time 14.5 11.4 - 15.2 seconds   INR 1.1 0.8 - 1.2    Comment: (NOTE) INR goal varies based on device and disease states. Performed at [facility] Lab, [address], [place], [place] [address]   APTT     Status: Abnormal   Collection Time: 08/06/23 11:00 AM  Result Value Ref Range   aPTT 75 (H) 24 - 36 seconds    Comment:        IF BASELINE aPTT IS ELEVATED, SUGGEST PATIENT RISK ASSESSMENT BE USED TO DETERMINE APPROPRIATE ANTICOAGULANT THERAPY. Performed at [facility], [address] [place], [place] [address]   CBC     Status: Abnormal   Collection Time: 08/06/23 11:00 AM  Result Value Ref Range   WBC 6.1 4.0 - 10.5 K/uL   RBC 4.07 3.87 - 5.11 MIL/uL    Hemoglobin 10.5 (L) 12.0 - 15.0 g/dL   HCT 29.5 (L) 28.4 - 13.2 %   MCV 80.6 80.0 - 100.0 fL   MCH 25.8 (L) 26.0 - 34.0 pg   MCHC 32.0 30.0 - 36.0 g/dL  RDW 15.5 11.5 - 15.5 %   Platelets 137 (L) 150 - 400 K/uL   nRBC 0.0 0.0 - 0.2 %    Comment: Performed at [facility], [address] [place], [place] [address]  Differential     Status: None   Collection Time: 08/06/23 11:00 AM  Result Value Ref Range   Neutrophils Relative % 77 %   Neutro Abs 4.7 1.7 - 7.7 K/uL   Lymphocytes Relative 15 %   Lymphs Abs 0.9 0.7 - 4.0 K/uL   Monocytes Relative 5 %   Monocytes Absolute 0.3 0.1 - 1.0 K/uL   Eosinophils Relative 2 %   Eosinophils Absolute 0.1 0.0 - 0.5 K/uL   Basophils Relative 1 %   Basophils Absolute 0.0 0.0 - 0.1 K/uL   Immature Granulocytes 0 %   Abs Immature Granulocytes 0.02 0.00 - 0.07 K/uL    Comment: Performed at [facility], [address], [place], [place] [address]  Comprehensive metabolic panel     Status: Abnormal   Collection Time: 08/06/23 11:00 AM  Result Value Ref Range   Sodium 134 (L) 135 - 145 mmol/L   Potassium 4.1 3.5 - 5.1 mmol/L   Chloride 96 (L) 98 - 111 mmol/L   CO2 24 22 - 32 mmol/L   Glucose, Bld 102 (H) 70 - 99 mg/dL    Comment: Glucose reference range applies only to samples taken after fasting for at least 8 hours.   BUN 34 (H) 8 - 23 mg/dL   Creatinine, Ser 0.98 (H) 0.44 - 1.00 mg/dL   Calcium 8.8 (L) 8.9 - 10.3 mg/dL   Total Protein 6.6 6.5 - 8.1 g/dL   Albumin 3.5 3.5 - 5.0 g/dL   AST 16 15 - 41 U/L   ALT 15 0 - 44 U/L   Alkaline Phosphatase 73 38 - 126 U/L   Total Bilirubin 0.6 0.3 - 1.2 mg/dL   GFR, Estimated 7 (L) >60 mL/min    Comment: (NOTE) Calculated using the CKD-EPI Creatinine Equation (2021)    Anion gap 14 5 - 15    Comment: Performed at [facility], [address] [place], [place] [address]  Troponin I (High Sensitivity)     Status: None   Collection Time: 08/06/23 11:00 AM  Result Value Ref Range    Troponin I (High Sensitivity) 8 <18 ng/L    Comment: (NOTE) Elevated high sensitivity troponin I (hsTnI) values and significant  changes across serial measurements may suggest ACS but many other  chronic and acute conditions are known to elevate hsTnI results.  Refer to the "Links" section for chest pain algorithms and additional  guidance. Performed at [facility], [address] [place], [place] [address]   I-stat chem 8, ED     Status: Abnormal   Collection Time: 08/06/23 11:08 AM  Result Value Ref Range   Sodium 138 135 - 145 mmol/L   Potassium 4.3 3.5 - 5.1 mmol/L   Chloride 102 98 - 111 mmol/L   BUN 35 (H) 8 - 23 mg/dL   Creatinine, Ser 6.21 (H) 0.44 - 1.00 mg/dL   Glucose, Bld 308 (H) 70 - 99 mg/dL    Comment: Glucose reference range applies only to samples taken after fasting for at least 8 hours.   Calcium, Ion 1.05 (L) 1.15 - 1.40 mmol/L   TCO2 26 22 - 32 mmol/L   Hemoglobin 10.9 (L) 12.0 - 15.0 g/dL  HCT 32.0 (L) 36.0 - 46.0 %  Hemoglobin A1c     Status: None   Collection Time: 08/06/23 11:15 AM  Result Value Ref Range   Hgb A1c MFr Bld 5.2 4.8 - 5.6 %    Comment: (NOTE) Pre diabetes:          5.7%-6.4%  Diabetes:              >6.4%  Glycemic control for   <7.0% adults with diabetes    Mean Plasma Glucose 102.54 mg/dL    Comment: Performed at [facility], [address] [place], [place] [address]  Hepatitis B surface antigen     Status: None   Collection Time: 08/06/23  5:52 PM  Result Value Ref Range   Hepatitis B Surface Ag NON REACTIVE NON REACTIVE    Comment: Performed at [facility], [address] [place], [place] [address]  Hepatitis B surface antibody,quantitative     Status: None   Collection Time: 08/06/23  5:52 PM  Result Value Ref Range   Hep B S AB Quant (Post) 544.0 Immunity>10 mIU/mL    Comment: (NOTE)  Status of Immunity                     Anti-HBs Level  ------------------                      -------------- Inconsistent with Immunity                  0.0 - 10.0 Consistent with Immunity                         >10.0 Performed At: [facility] [address] [place], [place] [id number] Jolene Schimke MD WG:[id number]   HIV Antibody (routine testing w rflx)     Status: None   Collection Time: 08/06/23  5:52 PM  Result Value Ref Range   HIV Screen 4th Generation wRfx Non Reactive Non Reactive    Comment: Performed at [facility], [address] [place], [place] [address]  CBC     Status: Abnormal   Collection Time: 08/06/23  5:52 PM  Result Value Ref Range   WBC 6.9 4.0 - 10.5 K/uL   RBC 4.06 3.87 - 5.11 MIL/uL   Hemoglobin 10.5 (L) 12.0 - 15.0 g/dL   HCT 62.9 (L) 52.8 - 41.3 %   MCV 79.6 (L) 80.0 - 100.0 fL   MCH 25.9 (L) 26.0 - 34.0 pg   MCHC 32.5 30.0 - 36.0 g/dL   RDW 24.4 (H) 01.0 - 27.2 %   Platelets 171 150 - 400 K/uL   nRBC 0.0 0.0 - 0.2 %    Comment: Performed at [facility], [address] [place], [place] [address]  Creatinine, serum     Status: Abnormal   Collection Time: 08/06/23  5:52 PM  Result Value Ref Range   Creatinine, Ser 6.28 (H) 0.44 - 1.00 mg/dL   GFR, Estimated 7 (L) >60 mL/min    Comment: (NOTE) Calculated using the CKD-EPI Creatinine Equation (2021) Performed at [facility], [address] [place], [place] [address]   Glucose, capillary     Status: Abnormal   Collection Time: 08/06/23  8:55 PM  Result Value Ref Range   Glucose-Capillary 139 (H) 70 - 99 mg/dL    Comment: Glucose reference range applies only to samples  taken after fasting for at least 8 hours.  MRSA Next Gen by PCR, Nasal     Status: None   Collection Time: 08/06/23 10:58 PM   Specimen: Nasal Mucosa; Nasal Swab  Result Value Ref Range   MRSA by PCR Next Gen NOT DETECTED NOT DETECTED    Comment: (NOTE) The GeneXpert MRSA Assay (FDA approved for NASAL specimens only), is one component of a comprehensive MRSA colonization  surveillance program. It is not intended to diagnose MRSA infection nor to guide or monitor treatment for MRSA infections. Test performance is not FDA approved in patients less than 71 years old. Performed at [facility], [address], [place], [place] [address]   Basic metabolic panel     Status: Abnormal   Collection Time: 08/07/23  3:41 AM  Result Value Ref Range   Sodium 136 135 - 145 mmol/L   Potassium 4.4 3.5 - 5.1 mmol/L   Chloride 99 98 - 111 mmol/L   CO2 24 22 - 32 mmol/L   Glucose, Bld 107 (H) 70 - 99 mg/dL    Comment: Glucose reference range applies only to samples taken after fasting for at least 8 hours.   BUN 41 (H) 8 - 23 mg/dL   Creatinine, Ser 1.91 (H) 0.44 - 1.00 mg/dL   Calcium 9.9 8.9 - 47.8 mg/dL   GFR, Estimated 6 (L) >60 mL/min    Comment: (NOTE) Calculated using the CKD-EPI Creatinine Equation (2021)    Anion gap 13 5 - 15    Comment: Performed at [facility], [address] [place], [place] [address]  CBC     Status: Abnormal   Collection Time: 08/07/23  3:41 AM  Result Value Ref Range   WBC 7.4 4.0 - 10.5 K/uL   RBC 4.76 3.87 - 5.11 MIL/uL   Hemoglobin 12.3 12.0 - 15.0 g/dL   HCT 13.0 86.5 - 78.4 %   MCV 78.2 (L) 80.0 - 100.0 fL   MCH 25.8 (L) 26.0 - 34.0 pg   MCHC 33.1 30.0 - 36.0 g/dL   RDW 69.6 (H) 29.5 - 28.4 %   Platelets 178 150 - 400 K/uL   nRBC 0.0 0.0 - 0.2 %    Comment: Performed at [facility], [address], [place], [place] [address]  Magnesium     Status: None   Collection Time: 08/07/23  3:41 AM  Result Value Ref Range   Magnesium 2.4 1.7 - 2.4 mg/dL    Comment: Performed at [facility], [address], [place], [place] [address]  Phosphorus     Status: Abnormal   Collection Time: 08/07/23  3:41 AM  Result Value Ref Range   Phosphorus 6.5 (H) 2.5 - 4.6 mg/dL    Comment: Performed at [facility], [address], [place], [place] [address]    Recent Results (from the past 240 hour(s))   MRSA Next Gen by PCR, Nasal     Status: None   Collection Time: 08/06/23 10:58 PM   Specimen: Nasal Mucosa; Nasal Swab  Result Value Ref Range Status   MRSA by PCR Next Gen NOT DETECTED NOT DETECTED Final    Comment: (NOTE) The GeneXpert MRSA Assay (FDA approved for NASAL specimens only), is one component of a comprehensive MRSA colonization surveillance program. It is not intended to diagnose MRSA infection nor to guide or monitor treatment for MRSA infections. Test performance is not FDA approved in patients less than  15 years old. Performed at [facility], [address] [place], [place] [address]     Lipid Panel No results for input(s): "CHOL", "TRIG", "HDL", "CHOLHDL", "VLDL", "LDLCALC" in the last 72 hours.  Studies/Results: ECHOCARDIOGRAM COMPLETE  Result Date: 08/07/2023    ECHOCARDIOGRAM REPORT   Patient Name:   QUANDA KERRY Date of Exam: 08/07/2023 Medical Rec #:  [id number]       Height:       62.0 in Accession #:    [id number]      Weight:       214.7 lb Date of Birth:  30-Aug-1956       BSA:          1.970 m Patient Age:    66 years        BP:           155/87 mmHg Patient Gender: F               HR:           69 bpm. Exam Location:  Inpatient Procedure: 2D Echo, Color Doppler, Cardiac Doppler and Intracardiac            Opacification Agent Indications:    R07.9* Chest pain, unspecified  History:        Patient has prior history of Echocardiogram examinations, most                 recent 05/27/2018. Risk Factors:Hypertension, Sleep Apnea and                 ESRD.  Sonographer:    Irving Burton Senior RDCS Referring Phys: [id number] PETER E BABCOCK IMPRESSIONS  1. Left ventricular ejection fraction, by estimation, is 60 to 65%. Left ventricular ejection fraction by PLAX is 60 %. The left ventricle has normal function. The left ventricle has no regional wall motion abnormalities. Left ventricular diastolic parameters are consistent with Grade I diastolic dysfunction (impaired relaxation).   2. Right ventricular systolic function is normal. The right ventricular size is normal. Tricuspid regurgitation signal is inadequate for assessing PA pressure.  3. Left atrial size was mildly dilated.  4. The mitral valve is normal in structure. Trivial mitral valve regurgitation.  5. The aortic valve is tricuspid. There is mild calcification of the aortic valve. Aortic valve regurgitation is not visualized. Aortic valve sclerosis is present, with no evidence of aortic valve stenosis.  6. The inferior vena cava is normal in size with greater than 50% respiratory variability, suggesting right atrial pressure of 3 mmHg. FINDINGS  Left Ventricle: Left ventricular ejection fraction, by estimation, is 60 to 65%. Left ventricular ejection fraction by PLAX is 60 %. The left ventricle has normal function. The left ventricle has no regional wall motion abnormalities. Definity contrast agent was given IV to delineate the left ventricular endocardial borders. The left ventricular internal cavity size was normal in size. There is borderline concentric left ventricular hypertrophy. Left ventricular diastolic parameters are consistent with  Grade I diastolic dysfunction (impaired relaxation). Right Ventricle: The right ventricular size is normal. No increase in right ventricular wall thickness. Right ventricular systolic function is normal. Tricuspid regurgitation signal is inadequate for assessing PA pressure. Left Atrium: Left atrial size was mildly dilated. Right Atrium: Right atrial size was normal in size. Pericardium: Trivial pericardial effusion is present. Presence of epicardial fat layer. Mitral Valve: The mitral valve is normal in structure. Trivial mitral valve regurgitation. Tricuspid Valve: The tricuspid valve is normal in structure.  Tricuspid valve regurgitation is trivial. Aortic Valve: The aortic valve is tricuspid. There is mild calcification of the aortic valve. Aortic valve regurgitation is not visualized.  Aortic valve sclerosis is present, with no evidence of aortic valve stenosis. Pulmonic Valve: The pulmonic valve was normal in structure. Pulmonic valve regurgitation is trivial. Aorta: The aortic root and ascending aorta are structurally normal, with no evidence of dilitation. Venous: The inferior vena cava is normal in size with greater than 50% respiratory variability, suggesting right atrial pressure of 3 mmHg. IAS/Shunts: No atrial level shunt detected by color flow Doppler.  LEFT VENTRICLE PLAX 2D LV EF:         Left            Diastology                ventricular     LV e' medial:    5.33 cm/s                ejection        LV E/e' medial:  11.9                fraction by     LV e' lateral:   5.98 cm/s                PLAX is 60      LV E/e' lateral: 10.6                %. LVIDd:         4.10 cm LVIDs:         2.80 cm LV PW:         1.10 cm LV IVS:        1.00 cm LVOT diam:     2.00 cm LV SV:         58 LV SV Index:   30 LVOT Area:     3.14 cm  RIGHT VENTRICLE RV S prime:     14.70 cm/s TAPSE (M-mode): 1.9 cm LEFT ATRIUM             Index        RIGHT ATRIUM           Index LA diam:        2.90 cm 1.47 cm/m   RA Area:     16.20 cm LA Vol (A2C):   61.7 ml 31.32 ml/m  RA Volume:   40.00 ml  20.30 ml/m LA Vol (A4C):   76.2 ml 38.67 ml/m LA Biplane Vol: 71.6 ml 36.34 ml/m  AORTIC VALVE LVOT Vmax:   90.70 cm/s LVOT Vmean:  65.600 cm/s LVOT VTI:    0.186 m  AORTA Ao Root diam: 2.90 cm Ao Asc diam:  3.30 cm MITRAL VALVE MV Area (PHT): 3.05 cm    SHUNTS MV Decel Time: 249 msec    Systemic VTI:  0.19 m MV E velocity: 63.40 cm/s  Systemic Diam: 2.00 cm MV A velocity: 74.60 cm/s MV E/A ratio:  0.85 Arvilla Meres MD Electronically signed by Arvilla Meres MD Signature Date/Time: 08/07/2023/3:50:49 PM    Final    MR ANGIO NECK WO CONTRAST  Result Date: 08/06/2023 CLINICAL DATA:  Neuro deficit, acute, stroke suspected EXAM: MRA NECK WITHOUT CONTRAST TECHNIQUE: Angiographic images of the neck were acquired  using MRA technique without intravenous contrast. Carotid stenosis measurements (when applicable) are obtained utilizing NASCET criteria, using the distal internal carotid diameter as the denominator. COMPARISON:  None Available.  FINDINGS: Aortic arch: Standard three-vessel branch pattern. Normal flow signal. Right carotid system: Normal flow signal. No focal occlusion. No significant stenosis. Left carotid system: Normal flow signal. No focal occlusion. No significant stenosis Vertebral arteries: The V1 segment of the left vertebral artery is poorly visualized due to respiratory motion artifact. Within this limitation, normal flow signal. No focal occlusion. No significant stenosis. Other: None. IMPRESSION: No large vessel occlusion or hemodynamically significant stenosis in the neck. Electronically Signed   By: Lorenza Cambridge M.D.   On: 08/06/2023 12:49   DG Chest Portable 1 View  Result Date: 08/06/2023 CLINICAL DATA:  67 year old female with chest pain.  Dialysis. EXAM: PORTABLE CHEST 1 VIEW COMPARISON:  Chest radiographs 06/09/2015. FINDINGS: Portable AP semi upright view at 1045 hours. Right chest dual lumen dialysis type catheter with no adverse features. Cardiomegaly. Tortuous thoracic aorta. Other mediastinal contours are within normal limits. Visualized tracheal air column is within normal limits. Lung volumes are within normal limits. Allowing for portable technique the lungs are clear. No pneumothorax. Partially visible bilateral shoulder arthroplasty. Left subclavian vascular stent also in place. IMPRESSION: 1. Right IJ dialysis type catheter with no adverse features. 2. Cardiomegaly and tortuous thoracic aorta. No acute cardiopulmonary abnormality. Electronically Signed   By: Odessa Fleming M.D.   On: 08/06/2023 11:58   MR BRAIN WO CONTRAST  Result Date: 08/06/2023 CLINICAL DATA:  Neuro deficit, acute, stroke suspected EXAM: MRI HEAD WITHOUT CONTRAST MRA HEAD WITHOUT CONTRAST TECHNIQUE: Multiplanar,  multiecho pulse sequences of the brain and surrounding structures were obtained without intravenous contrast. Multiplanar, multi-echo pulse sequences of the brain and surrounding structures were acquired without and with intravenous contrast. Angiographic images of the Circle of Willis were acquired using MRA technique without intravenous contrast. COMPARISON:  None Available. FINDINGS: Brain: Negative for acute infarct. No hemorrhage. No hydrocephalus. No extra-axial fluid collection. There is sequela of mild chronic microvascular ischemic change. There is T2/stir hyperintense signal in the bilateral occipital and posterior temporal lobes (series 4, image 17-14). 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 There is an additional lesion that is along the splenium of the corpus callosum on the right (series 4, image 20). Enlarged and partially empty sella. Vascular: Normal flow voids. Skull and upper cervical spine: Normal marrow signal. Sinuses/Orbits: Negative. Other: None. MRA HEAD FINDINGS Anterior circulation: No aneurysm. No proximal occlusion. No significant stenosis. Posterior circulation: No aneurysm. No proximal occlusion. No significant stenosis. Anatomic variants: Fetal PCA on the right IMPRESSION: 1. Negative for an acute infarct. 2. T2/FLAIR hyperintense signal in the bilateral occipital and posterior temporal lobes are favored to represent findings related to PRES. 3. Additional scattered areas of cortical T2/FLAIR hyperintense are favored to represent chronic infarcts. One of the lesions involves the splenium of the corpus callosum on the right. Further evaluation with a contrast enhanced brain MRI could be considered for more definitive characterization. 4. No intracranial large vessel occlusion. Electronically Signed   By: Lorenza Cambridge M.D.   On: 08/06/2023 10:29   MR ANGIO HEAD WO CONTRAST  Result Date:  08/06/2023 CLINICAL DATA:  Neuro deficit, acute, stroke suspected EXAM: MRI HEAD WITHOUT CONTRAST MRA HEAD WITHOUT CONTRAST TECHNIQUE: Multiplanar, multiecho pulse sequences of the brain and surrounding structures were obtained without intravenous contrast. Multiplanar, multi-echo pulse sequences of the brain and surrounding structures were acquired without and with intravenous contrast. Angiographic images of the Circle of Willis were acquired using MRA technique without intravenous  contrast. COMPARISON:  None Available. FINDINGS: Brain: Negative for acute infarct. No hemorrhage. No hydrocephalus. No extra-axial fluid collection. There is sequela of mild chronic microvascular ischemic change. There is T2/stir hyperintense signal in the bilateral occipital and posterior temporal lobes (series 4, image 17-14). 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 There is an additional lesion that is along the splenium of the corpus callosum on the right (series 4, image 20). Enlarged and partially empty sella. Vascular: Normal flow voids. Skull and upper cervical spine: Normal marrow signal. Sinuses/Orbits: Negative. Other: None. MRA HEAD FINDINGS Anterior circulation: No aneurysm. No proximal occlusion. No significant stenosis. Posterior circulation: No aneurysm. No proximal occlusion. No significant stenosis. Anatomic variants: Fetal PCA on the right IMPRESSION: 1. Negative for an acute infarct. 2. T2/FLAIR hyperintense signal in the bilateral occipital and posterior temporal lobes are favored to represent findings related to PRES. 3. Additional scattered areas of cortical T2/FLAIR hyperintense are favored to represent chronic infarcts. One of the lesions involves the splenium of the corpus callosum on the right. Further evaluation with a contrast enhanced brain MRI could be considered for more definitive characterization. 4. No  intracranial large vessel occlusion. Electronically Signed   By: Lorenza Cambridge M.D.   On: 08/06/2023 10:29   CT HEAD CODE STROKE WO CONTRAST  Result Date: 08/06/2023 CLINICAL DATA:  Code stroke. 67 year old female (not otherwise specified at the time of this report). EXAM: CT HEAD WITHOUT CONTRAST TECHNIQUE: Contiguous axial images were obtained from the base of the skull through the vertex without intravenous contrast. RADIATION DOSE REDUCTION: This exam was performed according to the departmental dose-optimization program which includes automated exposure control, adjustment of the mA and/or kV according to patient size and/or use of iterative reconstruction technique. COMPARISON:  None Available. FINDINGS: Brain: No midline shift, mass effect, or evidence of intracranial mass lesion. No ventriculomegaly. No acute intracranial hemorrhage identified. Gray-white differentiation within normal limits for age throughout the brain. No acute or chronic cortically based infarct identified. Small left coronal fissure cyst, normal variant. Vascular: No suspicious intracranial vascular hyperdensity. Calcified atherosclerosis at the skull base. Skull: No acute osseous abnormality identified. Sinuses/Orbits: Visualized paranasal sinuses and mastoids are clear. Other: No gaze deviation.  No acute scalp soft tissue finding. ASPECTS [facility] Stroke Program Early CT Score) Total score (0-10 with 10 being normal): 10 IMPRESSION: 1. Normal for age noncontrast CT appearance of the brain. ASPECTS 10. 2. These results were communicated to Dr. Otelia Limes at 8:51 am on 08/06/2023 by text page via the [facility] messaging system. Electronically Signed   By: Odessa Fleming M.D.   On: 08/06/2023 08:51    Medications: Scheduled:  amLODipine  10 mg Oral Daily   Chlorhexidine Gluconate Cloth  6 each Topical Q0600   [START ON 08/08/2023] Chlorhexidine Gluconate Cloth  6 each Topical Q0600   [START ON 08/08/2023] cinacalcet  60 mg Oral Q M,W,F-HD    folic acid  1 mg Oral Daily   heparin  5,000 Units Subcutaneous Q8H   heparin sodium (porcine)  3,200 Units Intracatheter Once   hydrALAZINE  75 mg Oral Q8H   metoprolol tartrate  100 mg Oral BID   sodium chloride flush  3 mL Intravenous Q12H   sucroferric oxyhydroxide  1,500 mg Oral TID WC   venlafaxine XR  37.5 mg Oral Q breakfast   Continuous:  sodium chloride     anticoagulant sodium citrate  clevidipine 2 mg/hr (08/07/23 1629)    Assessment: 67 y.o. female presenting with acute confusion and aphasia in the context of severely elevated BP and low blood glucose. MRI showed findings consistent with PRES.  - Exam improved today. Mentation and speech are back to baseline. No visual symptoms.  - Does have distal RUE weakness and numbness along her right palm, most likely secondary to nerve injury due to recent fistula-related operations.     Impression: - PRES. 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 Please call if there are additional questions.        LOS: 1 day   @Electronically  signed: Dr. Caryl Pina 08/07/2023  5:14 PM

## 2023-08-08 ENCOUNTER — Inpatient Hospital Stay (HOSPITAL_COMMUNITY): Payer: Medicare Other

## 2023-08-08 ENCOUNTER — Other Ambulatory Visit (HOSPITAL_COMMUNITY): Payer: Self-pay

## 2023-08-08 ENCOUNTER — Encounter (HOSPITAL_COMMUNITY): Payer: Medicare Other

## 2023-08-08 ENCOUNTER — Encounter (HOSPITAL_COMMUNITY): Payer: Self-pay | Admitting: Pulmonary Disease

## 2023-08-08 ENCOUNTER — Other Ambulatory Visit: Payer: Self-pay

## 2023-08-08 DIAGNOSIS — R609 Edema, unspecified: Secondary | ICD-10-CM | POA: Diagnosis not present

## 2023-08-08 DIAGNOSIS — I161 Hypertensive emergency: Secondary | ICD-10-CM | POA: Diagnosis not present

## 2023-08-08 LAB — CBC
HCT: 32.7 % — ABNORMAL LOW (ref 36.0–46.0)
Hemoglobin: 11 g/dL — ABNORMAL LOW (ref 12.0–15.0)
MCH: 27.1 pg (ref 26.0–34.0)
MCHC: 33.6 g/dL (ref 30.0–36.0)
MCV: 80.5 fL (ref 80.0–100.0)
Platelets: 177 10*3/uL (ref 150–400)
RBC: 4.06 MIL/uL (ref 3.87–5.11)
RDW: 15.9 % — ABNORMAL HIGH (ref 11.5–15.5)
WBC: 6.7 10*3/uL (ref 4.0–10.5)
nRBC: 0 % (ref 0.0–0.2)

## 2023-08-08 LAB — BASIC METABOLIC PANEL
Anion gap: 11 (ref 5–15)
BUN: 36 mg/dL — ABNORMAL HIGH (ref 8–23)
CO2: 24 mmol/L (ref 22–32)
Calcium: 9.6 mg/dL (ref 8.9–10.3)
Chloride: 99 mmol/L (ref 98–111)
Creatinine, Ser: 6.02 mg/dL — ABNORMAL HIGH (ref 0.44–1.00)
GFR, Estimated: 7 mL/min — ABNORMAL LOW (ref >60)
Glucose, Bld: 125 mg/dL — ABNORMAL HIGH (ref 70–99)
Potassium: 4.1 mmol/L (ref 3.5–5.1)
Sodium: 134 mmol/L — ABNORMAL LOW (ref 135–145)

## 2023-08-08 MED ORDER — CLONIDINE HCL 0.1 MG PO TABS
0.1000 mg | ORAL_TABLET | Freq: Three times a day (TID) | ORAL | Status: DC
  Administered 2023-08-08: 0.1 mg via ORAL
  Filled 2023-08-08: qty 1

## 2023-08-08 MED ORDER — HYDRALAZINE HCL 50 MG PO TABS
100.0000 mg | ORAL_TABLET | Freq: Three times a day (TID) | ORAL | Status: DC
  Administered 2023-08-08: 100 mg via ORAL
  Filled 2023-08-08: qty 2

## 2023-08-08 MED ORDER — SODIUM CHLORIDE 0.9 % IV SOLN
250.0000 mL | INTRAVENOUS | Status: DC
  Administered 2023-08-08: 250 mL via INTRAVENOUS

## 2023-08-08 MED ORDER — ACETAMINOPHEN 325 MG PO TABS
650.0000 mg | ORAL_TABLET | Freq: Four times a day (QID) | ORAL | Status: DC | PRN
  Administered 2023-08-08: 650 mg via ORAL
  Filled 2023-08-08: qty 2

## 2023-08-08 MED ORDER — HYDRALAZINE HCL 25 MG PO TABS
25.0000 mg | ORAL_TABLET | Freq: Once | ORAL | Status: AC
  Administered 2023-08-08: 25 mg via ORAL
  Filled 2023-08-08: qty 1

## 2023-08-08 MED ORDER — CARVEDILOL 25 MG PO TABS
25.0000 mg | ORAL_TABLET | Freq: Two times a day (BID) | ORAL | Status: DC
  Administered 2023-08-08 – 2023-08-09 (×3): 25 mg via ORAL
  Filled 2023-08-08 (×3): qty 1

## 2023-08-08 MED ORDER — CARVEDILOL 25 MG PO TABS: 25.0000 mg | ORAL_TABLET | Freq: Two times a day (BID) | ORAL | Status: DC

## 2023-08-08 MED ORDER — PHENYLEPHRINE HCL-NACL 20-0.9 MG/250ML-% IV SOLN: 25.0000 ug/min | INTRAVENOUS | Status: DC

## 2023-08-08 MED ORDER — ALBUMIN HUMAN 25 % IV SOLN
25.0000 g | Freq: Four times a day (QID) | INTRAVENOUS | Status: AC
  Administered 2023-08-08 – 2023-08-09 (×4): 25 g via INTRAVENOUS
  Filled 2023-08-08 (×5): qty 100

## 2023-08-08 MED ORDER — CLONIDINE HCL 0.1 MG PO TABS
0.2000 mg | ORAL_TABLET | Freq: Three times a day (TID) | ORAL | Status: DC
  Administered 2023-08-08: 0.2 mg via ORAL
  Filled 2023-08-08: qty 2

## 2023-08-08 MED ORDER — APIXABAN 5 MG PO TABS
5.0000 mg | ORAL_TABLET | Freq: Two times a day (BID) | ORAL | Status: DC
  Administered 2023-08-08 – 2023-08-09 (×2): 5 mg via ORAL
  Filled 2023-08-08 (×2): qty 1

## 2023-08-08 NOTE — Progress Notes (Signed)
   08/08/23 1740  Vitals  Temp 98.8 F (37.1 C)  Pulse Rate 85  Resp (!) 22  BP (!) 153/78  SpO2 98 %  O2 Device Room Air  Post Treatment  Dialyzer Clearance Clear  Hemodialysis Intake (mL) 0 mL  Liters Processed 66.8  Fluid Removed (mL) 1100 mL  Tolerated HD Treatment Yes  Post-Hemodialysis Comments tx terminated 27 mins early per pt request due to headache   Received patient in bed to unit.  Alert and oriented.  Informed consent signed and in chart.   TX duration:2hrs  Patient tolerated well.  Transported back to the room  Alert, without acute distress.  Hand-off given to patient's nurse.   Access used: [place] Access issues: frequent alarms  Total UF removed: 1.1L Medication(s) given: none    Christina Vasquez [facility]

## 2023-08-08 NOTE — Progress Notes (Signed)
eLink Physician-Brief Progress Note Patient Name: Christina Vasquez DOB: [birth year] MRN: [id number]   Date of Service  08/08/2023  HPI/Events of Note  HTN emergency with PRES. Plan Hd in the AM   67 year old female initially presented with hypertensive emergency complicated by press in the setting of end-stage renal disease.  Earlier in the day, she became progressively more hypotensive after dialysis and her oral antihypertensives.  eICU Interventions  Continue with albumin infusion.  If refractory to albumin, initiate phenylephrine peripherally     Intervention Category Intermediate Interventions: Hypotension - evaluation and management  [name] 08/08/2023, 7:08 PM

## 2023-08-08 NOTE — Progress Notes (Signed)
PHARMACY - ANTICOAGULATION CONSULT NOTE  Pharmacy Consult for apixaban Indication: DVT  Allergies  Allergen Reactions   Meloxicam Hives and Rash   Augmentin [Amoxicillin-Pot Clavulanate] Nausea And Vomiting   Tetracyclines & Related Hives    Patient Measurements: Weight: 96.9 kg (213 lb 10 oz)   Vital Signs: BP: 130/81 (10/02 1200) Pulse Rate: 83 (10/02 1200)  Labs: Recent Labs    08/06/23 1100 08/06/23 1108 08/06/23 1752 08/07/23 0341 08/08/23 0813  HGB 10.5*   < > 10.5* 12.3 11.0*  HCT 32.8*   < > 32.3* 37.2 32.7*  PLT 137*  --  171 178 177  APTT 75*  --   --   --   --   LABPROT 14.5  --   --   --   --   INR 1.1  --   --   --   --   CREATININE 5.91*   < > 6.28* 6.83* 6.02*  TROPONINIHS 8  --   --   --   --    < > = values in this interval not displayed.    Estimated Creatinine Clearance: 10.1 mL/min (A) (by C-G formula based on SCr of 6.02 mg/dL (H)).   Medical History: Past Medical History:  Diagnosis Date   Anemia    Anxiety    Arthritis    Hyperparathyroidism (HCC)    Hypertension    Major depressive disorder 08/15/2018   PONV (postoperative nausea and vomiting)    Happened one time. Has had sx since then with no N/V   Renal disorder 12/19/2018   Stage 4 ESRD. HD-MWF   Sleep apnea    does not use CPAP    Medications:  Facility-Administered Medications Prior to Admission  Medication Dose Route Frequency Provider Last Rate Last Admin   diclofenac Sodium (VOLTAREN) 1 % topical gel 4 g  4 g Topical QID Huel Cote, MD       Medications Prior to Admission  Medication Sig Dispense Refill Last Dose   acetaminophen (TYLENOL) 650 MG CR tablet Take 1,300 mg by mouth every 8 (eight) hours as needed for pain.   08/05/2023   amLODipine (NORVASC) 10 MG tablet Take 10 mg by mouth daily.   Past Week   cinacalcet (SENSIPAR) 60 MG tablet Take 60 mg by mouth every Monday, Wednesday, and Friday with hemodialysis.   Past Week   folic acid (FOLVITE) 1 MG tablet  Take 1 tablet (1 mg total) by mouth daily. 90 tablet 0 Past Week   metoprolol succinate (TOPROL-XL) 50 MG 24 hr tablet Take 50 mg by mouth at bedtime. Take with or immediately following a meal.   Past Week   omeprazole (PRILOSEC) 40 MG capsule Take 40 mg by mouth daily.   Past Week   oxyCODONE (OXY IR/ROXICODONE) 5 MG immediate release tablet Take 1 tablet (5 mg total) by mouth every 6 (six) hours as needed for severe pain. 10 tablet 0 Past Week   sucroferric oxyhydroxide (VELPHORO) 500 MG chewable tablet Chew 1,500 mg by mouth 3 (three) times daily with meals.   Past Month   venlafaxine XR (EFFEXOR-XR) 37.5 MG 24 hr capsule Take 37.5 mg by mouth daily with breakfast.   Past Week   VITAMIN D PO Take 4 capsules by mouth every Monday, Wednesday, and Friday with hemodialysis.   Past Week   oxyCODONE (ROXICODONE) 5 MG immediate release tablet Take 1 tablet (5 mg total) by mouth every 6 (six) hours as needed for severe  pain. 10 tablet 0    Semaglutide,0.25 or 0.5MG /DOS, (OZEMPIC, 0.25 OR 0.5 MG/DOSE,) 2 MG/3ML SOPN Inject 0.25-0.5 mg into the skin once a week. (Patient not taking: Reported on 08/06/2023)   Not Taking   Scheduled:   amLODipine  10 mg Oral Daily   carvedilol  25 mg Oral BID WC   Chlorhexidine Gluconate Cloth  6 each Topical Q0600   Chlorhexidine Gluconate Cloth  6 each Topical Q0600   cinacalcet  60 mg Oral Q M,W,F-HD   cloNIDine  0.2 mg Oral TID   folic acid  1 mg Oral Daily   heparin  5,000 Units Subcutaneous Q8H   heparin sodium (porcine)  3,200 Units Intracatheter Once   hydrALAZINE  100 mg Oral Q8H   sodium chloride flush  3 mL Intravenous Q12H   sucroferric oxyhydroxide  1,500 mg Oral TID WC   venlafaxine XR  37.5 mg Oral Q breakfast    Assessment: 67 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  She is noted with ESRD -hg= 11  Discussed dosing with Dr. Katrinka Blazing and will not use apixaban loading due to possible bleeding risk  Goal of Therapy:  Monitor platelets  by anticoagulation protocol: Yes   Plan:  -apixaban 5mg  po bid -Will follow patient progress  Harland German, PharmD Clinical Pharmacist **Pharmacist phone directory can now be found on [email or web address] ([email or web address]).  Listed under [facility].

## 2023-08-08 NOTE — Progress Notes (Signed)
[facility] Progress Note   Subjective:   Patient seen and examined at bedside. Reports pain between her eyes earlier today when her SBP dropped to 120s. Feeling better now. Denies CP, SOB, abdominal pain and n/v/d.  Hand continues to feel cold, daughter is concerned about possible clot in RUE.  To get [place] to eval.   Objective Vitals:   08/08/23 1115 08/08/23 1130 08/08/23 1145 08/08/23 1200  BP: (!) 148/80 124/77 138/85 130/81  Pulse: 76 73 83 83  Resp: 13 15 19 15   Temp:      TempSrc:      SpO2: 99% 98% 98% 97%  Weight:       Physical Exam General:well appearing female in NAD Heart:RRR, no mrg Lungs:CTAB, nml WOB on RA Abdomen:soft, NTND Extremities:trace LE edema Dialysis Access: [facility]   Filed Weights   08/07/23 0740 08/07/23 1118 08/08/23 0500  Weight: 96.9 kg 97.4 kg 96.9 kg    Intake/Output Summary (Last 24 hours) at 08/08/2023 1253 Last data filed at 08/08/2023 0900 Gross per 24 hour  Intake 185.53 ml  Output --  Net 185.53 ml    Additional Objective Labs: Basic Metabolic Panel: Recent Labs  Lab 08/06/23 1100 08/06/23 1108 08/06/23 1752 08/07/23 0341 08/08/23 0813  NA 134* 138  --  136 134*  K 4.1 4.3  --  4.4 4.1  CL 96* 102  --  99 99  CO2 24  --   --  24 24  GLUCOSE 102* 100*  --  107* 125*  BUN 34* 35*  --  41* 36*  CREATININE 5.91* 6.40* 6.28* 6.83* 6.02*  CALCIUM 8.8*  --   --  9.9 9.6  PHOS  --   --   --  6.5*  --    Liver Function Tests: Recent Labs  Lab 08/06/23 1100  AST 16  ALT 15  ALKPHOS 73  BILITOT 0.6  PROT 6.6  ALBUMIN 3.5   CBC: Recent Labs  Lab 08/06/23 1100 08/06/23 1108 08/06/23 1752 08/07/23 0341 08/08/23 0813  WBC 6.1  --  6.9 7.4 6.7  NEUTROABS 4.7  --   --   --   --   HGB 10.5*   < > 10.5* 12.3 11.0*  HCT 32.8*   < > 32.3* 37.2 32.7*  MCV 80.6  --  79.6* 78.2* 80.5  PLT 137*  --  171 178 177   < > = values in this interval not displayed.   Blood Culture    Component Value Date/Time   SDES  BLOOD BLOOD LEFT HAND 04/06/2023 2154   SPECREQUEST  04/06/2023 2154    BOTTLES DRAWN AEROBIC ONLY Blood Culture adequate volume   CULT  04/06/2023 2154    NO GROWTH 5 DAYS Performed at [facility], [address], [place], [place] [address]    REPTSTATUS 04/11/2023 FINAL 04/06/2023 2154    Studies/Results: ECHOCARDIOGRAM COMPLETE  Result Date: 08/07/2023    ECHOCARDIOGRAM REPORT   Patient Name:   ADDELINA GUELKER Date of Exam: 08/07/2023 Medical Rec #:  [id number]       Height:       62.0 in Accession #:    [id number]      Weight:       214.7 lb Date of Birth:  09/18/1956       BSA:          1.970 m Patient Age:    67 years        BP:  155/87 mmHg Patient Gender: F               HR:           69 bpm. Exam Location:  Inpatient Procedure: 2D Echo, Color Doppler, Cardiac Doppler and Intracardiac            Opacification Agent Indications:    R07.9* Chest pain, unspecified  History:        Patient has prior history of Echocardiogram examinations, most                 recent 05/27/2018. Risk Factors:Hypertension, Sleep Apnea and                 ESRD.  Sonographer:    Irving Burton Senior RDCS Referring Phys: [id number] PETER E BABCOCK IMPRESSIONS  1. Left ventricular ejection fraction, by estimation, is 60 to 65%. Left ventricular ejection fraction by PLAX is 60 %. The left ventricle has normal function. The left ventricle has no regional wall motion abnormalities. Left ventricular diastolic parameters are consistent with Grade I diastolic dysfunction (impaired relaxation).  2. Right ventricular systolic function is normal. The right ventricular size is normal. Tricuspid regurgitation signal is inadequate for assessing PA pressure.  3. Left atrial size was mildly dilated.  4. The mitral valve is normal in structure. Trivial mitral valve regurgitation.  5. The aortic valve is tricuspid. There is mild calcification of the aortic valve. Aortic valve regurgitation is not visualized. Aortic valve sclerosis is  present, with no evidence of aortic valve stenosis.  6. The inferior vena cava is normal in size with greater than 50% respiratory variability, suggesting right atrial pressure of 3 mmHg. FINDINGS  Left Ventricle: Left ventricular ejection fraction, by estimation, is 60 to 65%. Left ventricular ejection fraction by PLAX is 60 %. The left ventricle has normal function. The left ventricle has no regional wall motion abnormalities. Definity contrast agent was given IV to delineate the left ventricular endocardial borders. The left ventricular internal cavity size was normal in size. There is borderline concentric left ventricular hypertrophy. Left ventricular diastolic parameters are consistent with  Grade I diastolic dysfunction (impaired relaxation). Right Ventricle: The right ventricular size is normal. No increase in right ventricular wall thickness. Right ventricular systolic function is normal. Tricuspid regurgitation signal is inadequate for assessing PA pressure. Left Atrium: Left atrial size was mildly dilated. Right Atrium: Right atrial size was normal in size. Pericardium: Trivial pericardial effusion is present. Presence of epicardial fat layer. Mitral Valve: The mitral valve is normal in structure. Trivial mitral valve regurgitation. Tricuspid Valve: The tricuspid valve is normal in structure. Tricuspid valve regurgitation is trivial. Aortic Valve: The aortic valve is tricuspid. There is mild calcification of the aortic valve. Aortic valve regurgitation is not visualized. Aortic valve sclerosis is present, with no evidence of aortic valve stenosis. Pulmonic Valve: The pulmonic valve was normal in structure. Pulmonic valve regurgitation is trivial. Aorta: The aortic root and ascending aorta are structurally normal, with no evidence of dilitation. Venous: The inferior vena cava is normal in size with greater than 50% respiratory variability, suggesting right atrial pressure of 3 mmHg. IAS/Shunts: No atrial  level shunt detected by color flow Doppler.  LEFT VENTRICLE PLAX 2D LV EF:         Left            Diastology                ventricular     LV e'  medial:    5.33 cm/s                ejection        LV E/e' medial:  11.9                fraction by     LV e' lateral:   5.98 cm/s                PLAX is 60      LV E/e' lateral: 10.6                %. LVIDd:         4.10 cm LVIDs:         2.80 cm LV PW:         1.10 cm LV IVS:        1.00 cm LVOT diam:     2.00 cm LV SV:         58 LV SV Index:   30 LVOT Area:     3.14 cm  RIGHT VENTRICLE RV S prime:     14.70 cm/s TAPSE (M-mode): 1.9 cm LEFT ATRIUM             Index        RIGHT ATRIUM           Index LA diam:        2.90 cm 1.47 cm/m   RA Area:     16.20 cm LA Vol (A2C):   61.7 ml 31.32 ml/m  RA Volume:   40.00 ml  20.30 ml/m LA Vol (A4C):   76.2 ml 38.67 ml/m LA Biplane Vol: 71.6 ml 36.34 ml/m  AORTIC VALVE LVOT Vmax:   90.70 cm/s LVOT Vmean:  65.600 cm/s LVOT VTI:    0.186 m  AORTA Ao Root diam: 2.90 cm Ao Asc diam:  3.30 cm MITRAL VALVE MV Area (PHT): 3.05 cm    SHUNTS MV Decel Time: 249 msec    Systemic VTI:  0.19 m MV E velocity: 63.40 cm/s  Systemic Diam: 2.00 cm MV A velocity: 74.60 cm/s MV E/A ratio:  0.85 Arvilla Meres MD Electronically signed by Arvilla Meres MD Signature Date/Time: 08/07/2023/3:50:49 PM    Final     Medications:  sodium chloride     anticoagulant sodium citrate     clevidipine Stopped (08/08/23 0842)    amLODipine  10 mg Oral Daily   carvedilol  25 mg Oral BID WC   Chlorhexidine Gluconate Cloth  6 each Topical Q0600   Chlorhexidine Gluconate Cloth  6 each Topical Q0600   cinacalcet  60 mg Oral Q M,W,F-HD   cloNIDine  0.2 mg Oral TID   folic acid  1 mg Oral Daily   heparin  5,000 Units Subcutaneous Q8H   heparin sodium (porcine)  3,200 Units Intracatheter Once   hydrALAZINE  100 mg Oral Q8H   sodium chloride flush  3 mL Intravenous Q12H   sucroferric oxyhydroxide  1,500 mg Oral TID WC   venlafaxine XR  37.5 mg  Oral Q breakfast    Dialysis Orders: MWF [facility]  3.5h  400/1.5   98.5kg   2/2 bath   TDC RIJ   Heparin 7000+ prn - last HD 9/30 this am, got 1 hr HD and then sent to HD       Assessment/ Plan: AMS - 2/2 hypertensive emergency with PRES on CT Hypertensive emergency w/PRES - required cleviprex drip - now  d/c.  BP improved.  Hydralazine 50mg  TID added to home regimen of amlodipine 10mg  every day, and metoprolol 100mg  qd. Admits to missing meds occasionally due to being unsure if she already took them.  Discussed using pill box to help ensure she takes her medication properly.  Also asked to get BP cuff and keep BP diary at home to bring with her to HD to ensure her BP is well controlled. Will need neuro f/u.  ESRD - on HD MWF. Shortened HD yesterday due to acute illness. HD this AM to complete treatment.  Plan for HD again tomorrow per regular schedule.  Cathflo dwell in [facility] d/t not functioning properly.   Volume - LE edema on exam.  Standing weight under dry today.  Came in at dry weight yesterday.  Suspect weight loss with recent decrease in appetite, lower dry weight on d/c.   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 No esa needs.  BMM - calcium at goal. Phos elevated.  Continue binders.  HD access - had her L AVF ligated in the summer to due painful aneuryms. Then had AVG placed 9/17, but then AVG was removed 9/24 due to steal symptoms. Continues to be cath-dependent. Per VVS. Having doppler completed today to r/o RUE DVT.   Virgina Norfolk, PA-C [facility] 08/08/2023,12:53 PM  LOS: 2 days

## 2023-08-08 NOTE — Discharge Instructions (Signed)
Information on my medicine - ELIQUIS (apixaban)  Why was Eliquis prescribed for you? Eliquis was prescribed to treat blood clots that may have been found in the veins of your legs (deep vein thrombosis) or in your lungs (pulmonary embolism) and to reduce the risk of them occurring again.  What do You need to know about Eliquis ? The  dose  ONE 5 mg tablet taken TWICE daily.  Eliquis may be taken with or without food.   Try to take the dose about the same time in the morning and in the evening. If you have difficulty swallowing the tablet whole please discuss with your pharmacist how to take the medication safely.  Take Eliquis exactly as prescribed and DO NOT stop taking Eliquis without talking to the doctor who prescribed the medication.  Stopping may increase your risk of developing a new blood clot.  Refill your prescription before you run out.  After discharge, you should have regular check-up appointments with your healthcare provider that is prescribing your Eliquis.    What do you do if you miss a dose? If a dose of ELIQUIS is not taken at the scheduled time, take it as soon as possible on the same day and twice-daily administration should be resumed. The dose should not be doubled to make up for a missed dose.  Important Safety Information A possible side effect of Eliquis is bleeding. You should call your healthcare provider right away if you experience any of the following: ? Bleeding from an injury or your nose that does not stop. ? Unusual colored urine (red or dark brown) or unusual colored stools (red or black). ? Unusual bruising for unknown reasons. ? A serious fall or if you hit your head (even if there is no bleeding).  Some medicines may interact with Eliquis and might increase your risk of bleeding or clotting while on Eliquis. To help avoid this, consult your healthcare provider or pharmacist prior to using any new prescription or non-prescription medications,  including herbals, vitamins, non-steroidal anti-inflammatory drugs (NSAIDs) and supplements.  This website has more information on Eliquis (apixaban): http://www.eliquis.com/eliquis/home   

## 2023-08-08 NOTE — Progress Notes (Signed)
Bilateral upper extremity venous duplex has been completed. Preliminary results can be found in CV Proc through chart review.  Results were given to the patient's nurse, [place].  08/08/23 1:48 PM Olen Cordial RVT

## 2023-08-08 NOTE — Progress Notes (Signed)
NAME:  Christina Vasquez, MRN:  [id number], DOB:  12/11/1955, LOS: 2 ADMISSION DATE:  08/06/2023, CONSULTATION DATE:  9/30 REFERRING MD:  Tegeler, CHIEF COMPLAINT:  hypertensive emergency and PRES   History of Present Illness:  This is a pleasant 67 year old female patient with end-stage renal disease and hypertension.  Also has had 2 failed dialysis access sites the left fistula, and failed right graft with associated significant nerve discomfort undergoes dialysis on Monday/Wednesday/Fridays.  Was in usual state of health until 9/28 when noted new onset of headache and intermittent chest discomfort.  The onset of the chest discomfort was not related to activity, and primarily over the right side of the chest, there was no neck or left arm discomfort however the headache persisted.  She has had no change in medications.  She has been taking her medications as prescribed.  Her dry weight has been stable at 98.5 kg.  She presented to dialysis on 9/30 was about 1 hour into therapy noting fairly significant headache and just not feeling well.  She became progressively confused and EMS was called.  On arrival blood pressure was 223/113.  Glucose 67, a code stroke was initially called and she was emergently brought to CT scanner.  This was negative, a subsequent MRI was obtained this showed T2 flair with hyperintense signal at bilateral occipital and posterior temporal lobes consistent with PRES, she was started on a Cleviprex drip, and critical care asked to admit.   Pertinent  Medical History  Hypertension, end-stage renal disease on Monday/Wednesday/Friday schedule dry weight 98.5 kg Thrombosed left upper arm fistula status post thrombectomy and surgical repair New right AV graft w/ sig pain felt 2/2 nerve pain reversed on 9/17 IV tunneled cath on right side Anemia of renal disease, hyperparathyroidism, depression, postoperative nausea vomited Cobos sleep apnea not on CPAP, anxiety.  Significant Hospital  Events: Including procedures, antibiotic start and stop dates in addition to other pertinent events   Admitted with press on 9/30  Interim History / Subjective:  Stubborn cleviprex need.  Objective   Blood pressure (!) 147/71, pulse 74, temperature 99 F (37.2 C), temperature source Oral, resp. rate 17, weight 96.9 kg, SpO2 96%.        Intake/Output Summary (Last 24 hours) at 08/08/2023 0656 Last data filed at 08/08/2023 0300 Gross per 24 hour  Intake 245.3 ml  Output 400 ml  Net -154.7 ml   Filed Weights   08/07/23 0740 08/07/23 1118 08/08/23 0500  Weight: 96.9 kg 97.4 kg 96.9 kg    Examination: No distress Pupils equal R hand numbness/weakness stable Lungs clear Heart sounds regular Left fistula good thrill  No labs  Resolved Hospital Problem list     Assessment & Plan:   Hypertensive emergency w/ PRES End-stage renal disease Anemia in the setting of end-stage renal disease Right arm pain and paresthesia.  Status post removal of AVG on 9/17 due to steal symptoms.  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

## 2023-08-08 NOTE — Plan of Care (Signed)
  Problem: Education: Goal: Knowledge of General Education information will improve Description: Including pain rating scale, medication(s)/side effects and non-pharmacologic comfort measures Outcome: Progressing   Problem: Clinical Measurements: Goal: Ability to maintain clinical measurements within normal limits will improve Outcome: Progressing   

## 2023-08-08 NOTE — Progress Notes (Signed)
Responded to protocol consult for US guided IV due to vasopressor order. Spoke with RN, Gerilyn Pilgrim, who states no additional access is needed at this time. VAST available for any future consults.

## 2023-08-08 NOTE — TOC Benefit Eligibility Note (Signed)
Patient Product/process development scientist completed.    The patient is insured through [facility]. Patient has Medicare and is not eligible for a copay card, but may be able to apply for patient assistance, if available.    Ran test claim for Eliquis 5 mg and the current 30 day co-pay is $149.53 due to being in Coverage Gap (donut hole).   This test claim was processed through [facility]- copay amounts may vary at other pharmacies due to pharmacy/plan contracts, or as the patient moves through the different stages of their insurance plan.     Roland Earl, CPHT Pharmacy Technician III Certified Patient [facility] Patient Advocate Team Direct Number: [phone]  Fax: [phone]

## 2023-08-08 NOTE — Progress Notes (Signed)
Pt receives out-pt HD at [facility] on MWF. Will assist as needed.   Kohlton Gilpatrick Renal Navigator [phone] 

## 2023-08-09 ENCOUNTER — Other Ambulatory Visit (HOSPITAL_COMMUNITY): Payer: Self-pay

## 2023-08-09 DIAGNOSIS — I161 Hypertensive emergency: Secondary | ICD-10-CM | POA: Diagnosis not present

## 2023-08-09 MED ORDER — HYDRALAZINE HCL 50 MG PO TABS: 75.0000 mg | ORAL_TABLET | Freq: Three times a day (TID) | ORAL | Status: DC

## 2023-08-09 MED ORDER — CARVEDILOL 25 MG PO TABS
25.0000 mg | ORAL_TABLET | Freq: Two times a day (BID) | ORAL | 2 refills | Status: AC
  Filled 2023-08-09 – 2023-09-07 (×3): qty 60, 30d supply, fill #0
  Filled 2023-10-07: qty 60, 30d supply, fill #1

## 2023-08-09 MED ORDER — APIXABAN 5 MG PO TABS
5.0000 mg | ORAL_TABLET | Freq: Two times a day (BID) | ORAL | 2 refills | Status: DC
Start: 2023-08-09 — End: 2023-08-15
  Filled 2023-08-09: qty 60, 30d supply, fill #0

## 2023-08-09 MED ORDER — HYDRALAZINE HCL 50 MG PO TABS
50.0000 mg | ORAL_TABLET | Freq: Three times a day (TID) | ORAL | 2 refills | Status: DC
  Filled 2023-08-09 – 2023-09-04 (×2): qty 90, 30d supply, fill #0

## 2023-08-09 MED ORDER — HYDRALAZINE HCL 50 MG PO TABS
50.0000 mg | ORAL_TABLET | Freq: Three times a day (TID) | ORAL | Status: DC
  Administered 2023-08-09: 50 mg via ORAL
  Filled 2023-08-09: qty 1

## 2023-08-09 NOTE — Progress Notes (Signed)
Radial        Full                                          +----------+------------+---------+-----------+----------+-------+ Ulnar         Full                                          +----------+------------+---------+-----------+----------+-------+ Cephalic      Full                                          +----------+------------+---------+-----------+----------+-------+ Basilic       Full                                          +----------+------------+---------+-----------+----------+-------+  Summary:  Right: No evidence of deep vein thrombosis in the upper extremity. Findings consistent  with acute superficial vein thrombosis involving the right cephalic vein. Superficial thrombus detected in the cephalic vein is surrounding an IV in the mid forearm.  Left: No evidence of superficial vein thrombosis in the upper extremity. Findings consistent with acute deep vein thrombosis involving the left subclavian vein.  *See table(s) above for measurements and observations.  Diagnosing physician: Sherald Hess MD Electronically signed by Sherald Hess MD on 08/08/2023 at 3:49:15 PM.    Final    ECHOCARDIOGRAM COMPLETE  Result Date: 08/07/2023    ECHOCARDIOGRAM REPORT   Patient Name:   Christina Vasquez Date of Exam: 08/07/2023 Medical Rec #:  [id number]       Height:       62.0 in Accession #:    [id number]      Weight:       214.7 lb Date of Birth:  1956/10/23       BSA:          1.970 m Patient Age:    66 years        BP:           155/87 mmHg Patient Gender: F               HR:           69 bpm. Exam Location:  Inpatient Procedure: 2D Echo, Color Doppler, Cardiac Doppler and Intracardiac            Opacification Agent Indications:    R07.9* Chest pain, unspecified  History:        Patient has prior history of Echocardiogram examinations, most                 recent 05/27/2018. Risk Factors:Hypertension, Sleep Apnea and                 ESRD.  Sonographer:    Irving Burton Senior RDCS Referring Phys: [id number] PETER E BABCOCK IMPRESSIONS  1. Left ventricular ejection fraction, by estimation, is 60 to 65%. Left ventricular ejection fraction by PLAX is 60 %. The left ventricle has normal function. The left ventricle has no regional wall motion abnormalities. Left ventricular diastolic parameters are consistent with Grade I diastolic dysfunction (impaired relaxation).  2.  [facility] Progress Note   Subjective:   Patient seen and examined at bedside.  Reports symptomatic drop in BP last night.  Feeling better today.  Denies CP, SOB, abdominal pain and n/v/d. +DVT in L subclavian vein, now started on Eliquis.  Objective Vitals:   08/09/23 1100 08/09/23 1133 08/09/23 1200 08/09/23 1300  BP: 133/68  135/69 130/71  Pulse: 82 85 86 87  Resp: 17 16 (!) 21 15  Temp:  98.4 F (36.9 C)    TempSrc:  Oral    SpO2: 97% 96% 96% 97%  Weight:       Physical Exam General:well appearing female in NAD Heart:RRR, no mrg Lungs:CTAB, nml WOB on RA Abdomen:soft, NTND Extremities:no LE edema Dialysis Access: [facility]   Filed Weights   08/08/23 0500 08/09/23 0500  Weight: 96.9 kg 98 kg    Intake/Output Summary (Last 24 hours) at 08/09/2023 1418 Last data filed at 08/09/2023 1300 Gross per 24 hour  Intake 473.09 ml  Output 1100 ml  Net -626.91 ml    Additional Objective Labs: Basic Metabolic Panel: Recent Labs  Lab 08/06/23 1100 08/06/23 1108 08/06/23 1752 08/07/23 0341 08/08/23 0813  NA 134* 138  --  136 134*  K 4.1 4.3  --  4.4 4.1  CL 96* 102  --  99 99  CO2 24  --   --  24 24  GLUCOSE 102* 100*  --  107* 125*  BUN 34* 35*  --  41* 36*  CREATININE 5.91* 6.40* 6.28* 6.83* 6.02*  CALCIUM 8.8*  --   --  9.9 9.6  PHOS  --   --   --  6.5*  --    Liver Function Tests: Recent Labs  Lab 08/06/23 1100  AST 16  ALT 15  ALKPHOS 73  BILITOT 0.6  PROT 6.6  ALBUMIN 3.5   CBC: Recent Labs  Lab 08/06/23 1100 08/06/23 1108 08/06/23 1752 08/07/23 0341 08/08/23 0813  WBC 6.1  --  6.9 7.4 6.7  NEUTROABS 4.7  --   --   --   --   HGB 10.5*   < > 10.5* 12.3 11.0*  HCT 32.8*   < > 32.3* 37.2 32.7*  MCV 80.6  --  79.6* 78.2* 80.5  PLT 137*  --  171 178 177   < > = values in this interval not displayed.   CBG: Recent Labs  Lab 08/06/23 0823 08/06/23 0855 08/06/23 0907 08/06/23 1050 08/06/23 2055  GLUCAP 76 67* 87 101* 139*     Studies/Results: VAS [place] UPPER EXTREMITY VENOUS DUPLEX  Result Date: 08/08/2023 UPPER VENOUS STUDY  Patient Name:  Christina Vasquez  Date of Exam:   08/08/2023 Medical Rec #: [id number]        Accession #:    [id number] Date of Birth: November 14, 1955        Patient Gender: F Patient Age:   20 years Exam Location:  [facility] Procedure:      VAS [place] UPPER EXTREMITY VENOUS DUPLEX Referring Phys: Levon Hedger --------------------------------------------------------------------------------  Indications: Edema Other Indications: Prior right graft HD acces. Prior right HD access. Risk Factors: None identified. Limitations: Poor ultrasound/tissue interface and patient positioning, patient immobility. Comparison Study: No prior studies. Performing Technologist: Chanda Busing RVT  Examination Guidelines: A complete evaluation includes B-mode imaging, spectral Doppler, color Doppler, and power Doppler as needed of all accessible portions of each vessel. Bilateral testing is considered an integral part of a complete examination. Limited examinations for  [facility] Progress Note   Subjective:   Patient seen and examined at bedside.  Reports symptomatic drop in BP last night.  Feeling better today.  Denies CP, SOB, abdominal pain and n/v/d. +DVT in L subclavian vein, now started on Eliquis.  Objective Vitals:   08/09/23 1100 08/09/23 1133 08/09/23 1200 08/09/23 1300  BP: 133/68  135/69 130/71  Pulse: 82 85 86 87  Resp: 17 16 (!) 21 15  Temp:  98.4 F (36.9 C)    TempSrc:  Oral    SpO2: 97% 96% 96% 97%  Weight:       Physical Exam General:well appearing female in NAD Heart:RRR, no mrg Lungs:CTAB, nml WOB on RA Abdomen:soft, NTND Extremities:no LE edema Dialysis Access: [facility]   Filed Weights   08/08/23 0500 08/09/23 0500  Weight: 96.9 kg 98 kg    Intake/Output Summary (Last 24 hours) at 08/09/2023 1418 Last data filed at 08/09/2023 1300 Gross per 24 hour  Intake 473.09 ml  Output 1100 ml  Net -626.91 ml    Additional Objective Labs: Basic Metabolic Panel: Recent Labs  Lab 08/06/23 1100 08/06/23 1108 08/06/23 1752 08/07/23 0341 08/08/23 0813  NA 134* 138  --  136 134*  K 4.1 4.3  --  4.4 4.1  CL 96* 102  --  99 99  CO2 24  --   --  24 24  GLUCOSE 102* 100*  --  107* 125*  BUN 34* 35*  --  41* 36*  CREATININE 5.91* 6.40* 6.28* 6.83* 6.02*  CALCIUM 8.8*  --   --  9.9 9.6  PHOS  --   --   --  6.5*  --    Liver Function Tests: Recent Labs  Lab 08/06/23 1100  AST 16  ALT 15  ALKPHOS 73  BILITOT 0.6  PROT 6.6  ALBUMIN 3.5   CBC: Recent Labs  Lab 08/06/23 1100 08/06/23 1108 08/06/23 1752 08/07/23 0341 08/08/23 0813  WBC 6.1  --  6.9 7.4 6.7  NEUTROABS 4.7  --   --   --   --   HGB 10.5*   < > 10.5* 12.3 11.0*  HCT 32.8*   < > 32.3* 37.2 32.7*  MCV 80.6  --  79.6* 78.2* 80.5  PLT 137*  --  171 178 177   < > = values in this interval not displayed.   CBG: Recent Labs  Lab 08/06/23 0823 08/06/23 0855 08/06/23 0907 08/06/23 1050 08/06/23 2055  GLUCAP 76 67* 87 101* 139*     Studies/Results: VAS [place] UPPER EXTREMITY VENOUS DUPLEX  Result Date: 08/08/2023 UPPER VENOUS STUDY  Patient Name:  Christina Vasquez  Date of Exam:   08/08/2023 Medical Rec #: [id number]        Accession #:    [id number] Date of Birth: November 14, 1955        Patient Gender: F Patient Age:   20 years Exam Location:  [facility] Procedure:      VAS [place] UPPER EXTREMITY VENOUS DUPLEX Referring Phys: Levon Hedger --------------------------------------------------------------------------------  Indications: Edema Other Indications: Prior right graft HD acces. Prior right HD access. Risk Factors: None identified. Limitations: Poor ultrasound/tissue interface and patient positioning, patient immobility. Comparison Study: No prior studies. Performing Technologist: Chanda Busing RVT  Examination Guidelines: A complete evaluation includes B-mode imaging, spectral Doppler, color Doppler, and power Doppler as needed of all accessible portions of each vessel. Bilateral testing is considered an integral part of a complete examination. Limited examinations for  [facility] Progress Note   Subjective:   Patient seen and examined at bedside.  Reports symptomatic drop in BP last night.  Feeling better today.  Denies CP, SOB, abdominal pain and n/v/d. +DVT in L subclavian vein, now started on Eliquis.  Objective Vitals:   08/09/23 1100 08/09/23 1133 08/09/23 1200 08/09/23 1300  BP: 133/68  135/69 130/71  Pulse: 82 85 86 87  Resp: 17 16 (!) 21 15  Temp:  98.4 F (36.9 C)    TempSrc:  Oral    SpO2: 97% 96% 96% 97%  Weight:       Physical Exam General:well appearing female in NAD Heart:RRR, no mrg Lungs:CTAB, nml WOB on RA Abdomen:soft, NTND Extremities:no LE edema Dialysis Access: [facility]   Filed Weights   08/08/23 0500 08/09/23 0500  Weight: 96.9 kg 98 kg    Intake/Output Summary (Last 24 hours) at 08/09/2023 1418 Last data filed at 08/09/2023 1300 Gross per 24 hour  Intake 473.09 ml  Output 1100 ml  Net -626.91 ml    Additional Objective Labs: Basic Metabolic Panel: Recent Labs  Lab 08/06/23 1100 08/06/23 1108 08/06/23 1752 08/07/23 0341 08/08/23 0813  NA 134* 138  --  136 134*  K 4.1 4.3  --  4.4 4.1  CL 96* 102  --  99 99  CO2 24  --   --  24 24  GLUCOSE 102* 100*  --  107* 125*  BUN 34* 35*  --  41* 36*  CREATININE 5.91* 6.40* 6.28* 6.83* 6.02*  CALCIUM 8.8*  --   --  9.9 9.6  PHOS  --   --   --  6.5*  --    Liver Function Tests: Recent Labs  Lab 08/06/23 1100  AST 16  ALT 15  ALKPHOS 73  BILITOT 0.6  PROT 6.6  ALBUMIN 3.5   CBC: Recent Labs  Lab 08/06/23 1100 08/06/23 1108 08/06/23 1752 08/07/23 0341 08/08/23 0813  WBC 6.1  --  6.9 7.4 6.7  NEUTROABS 4.7  --   --   --   --   HGB 10.5*   < > 10.5* 12.3 11.0*  HCT 32.8*   < > 32.3* 37.2 32.7*  MCV 80.6  --  79.6* 78.2* 80.5  PLT 137*  --  171 178 177   < > = values in this interval not displayed.   CBG: Recent Labs  Lab 08/06/23 0823 08/06/23 0855 08/06/23 0907 08/06/23 1050 08/06/23 2055  GLUCAP 76 67* 87 101* 139*     Studies/Results: VAS [place] UPPER EXTREMITY VENOUS DUPLEX  Result Date: 08/08/2023 UPPER VENOUS STUDY  Patient Name:  Christina Vasquez  Date of Exam:   08/08/2023 Medical Rec #: [id number]        Accession #:    [id number] Date of Birth: November 14, 1955        Patient Gender: F Patient Age:   20 years Exam Location:  [facility] Procedure:      VAS [place] UPPER EXTREMITY VENOUS DUPLEX Referring Phys: Levon Hedger --------------------------------------------------------------------------------  Indications: Edema Other Indications: Prior right graft HD acces. Prior right HD access. Risk Factors: None identified. Limitations: Poor ultrasound/tissue interface and patient positioning, patient immobility. Comparison Study: No prior studies. Performing Technologist: Chanda Busing RVT  Examination Guidelines: A complete evaluation includes B-mode imaging, spectral Doppler, color Doppler, and power Doppler as needed of all accessible portions of each vessel. Bilateral testing is considered an integral part of a complete examination. Limited examinations for  [facility] Progress Note   Subjective:   Patient seen and examined at bedside.  Reports symptomatic drop in BP last night.  Feeling better today.  Denies CP, SOB, abdominal pain and n/v/d. +DVT in L subclavian vein, now started on Eliquis.  Objective Vitals:   08/09/23 1100 08/09/23 1133 08/09/23 1200 08/09/23 1300  BP: 133/68  135/69 130/71  Pulse: 82 85 86 87  Resp: 17 16 (!) 21 15  Temp:  98.4 F (36.9 C)    TempSrc:  Oral    SpO2: 97% 96% 96% 97%  Weight:       Physical Exam General:well appearing female in NAD Heart:RRR, no mrg Lungs:CTAB, nml WOB on RA Abdomen:soft, NTND Extremities:no LE edema Dialysis Access: [facility]   Filed Weights   08/08/23 0500 08/09/23 0500  Weight: 96.9 kg 98 kg    Intake/Output Summary (Last 24 hours) at 08/09/2023 1418 Last data filed at 08/09/2023 1300 Gross per 24 hour  Intake 473.09 ml  Output 1100 ml  Net -626.91 ml    Additional Objective Labs: Basic Metabolic Panel: Recent Labs  Lab 08/06/23 1100 08/06/23 1108 08/06/23 1752 08/07/23 0341 08/08/23 0813  NA 134* 138  --  136 134*  K 4.1 4.3  --  4.4 4.1  CL 96* 102  --  99 99  CO2 24  --   --  24 24  GLUCOSE 102* 100*  --  107* 125*  BUN 34* 35*  --  41* 36*  CREATININE 5.91* 6.40* 6.28* 6.83* 6.02*  CALCIUM 8.8*  --   --  9.9 9.6  PHOS  --   --   --  6.5*  --    Liver Function Tests: Recent Labs  Lab 08/06/23 1100  AST 16  ALT 15  ALKPHOS 73  BILITOT 0.6  PROT 6.6  ALBUMIN 3.5   CBC: Recent Labs  Lab 08/06/23 1100 08/06/23 1108 08/06/23 1752 08/07/23 0341 08/08/23 0813  WBC 6.1  --  6.9 7.4 6.7  NEUTROABS 4.7  --   --   --   --   HGB 10.5*   < > 10.5* 12.3 11.0*  HCT 32.8*   < > 32.3* 37.2 32.7*  MCV 80.6  --  79.6* 78.2* 80.5  PLT 137*  --  171 178 177   < > = values in this interval not displayed.   CBG: Recent Labs  Lab 08/06/23 0823 08/06/23 0855 08/06/23 0907 08/06/23 1050 08/06/23 2055  GLUCAP 76 67* 87 101* 139*     Studies/Results: VAS [place] UPPER EXTREMITY VENOUS DUPLEX  Result Date: 08/08/2023 UPPER VENOUS STUDY  Patient Name:  Christina Vasquez  Date of Exam:   08/08/2023 Medical Rec #: [id number]        Accession #:    [id number] Date of Birth: November 14, 1955        Patient Gender: F Patient Age:   20 years Exam Location:  [facility] Procedure:      VAS [place] UPPER EXTREMITY VENOUS DUPLEX Referring Phys: Levon Hedger --------------------------------------------------------------------------------  Indications: Edema Other Indications: Prior right graft HD acces. Prior right HD access. Risk Factors: None identified. Limitations: Poor ultrasound/tissue interface and patient positioning, patient immobility. Comparison Study: No prior studies. Performing Technologist: Chanda Busing RVT  Examination Guidelines: A complete evaluation includes B-mode imaging, spectral Doppler, color Doppler, and power Doppler as needed of all accessible portions of each vessel. Bilateral testing is considered an integral part of a complete examination. Limited examinations for  Radial        Full                                          +----------+------------+---------+-----------+----------+-------+ Ulnar         Full                                          +----------+------------+---------+-----------+----------+-------+ Cephalic      Full                                          +----------+------------+---------+-----------+----------+-------+ Basilic       Full                                          +----------+------------+---------+-----------+----------+-------+  Summary:  Right: No evidence of deep vein thrombosis in the upper extremity. Findings consistent  with acute superficial vein thrombosis involving the right cephalic vein. Superficial thrombus detected in the cephalic vein is surrounding an IV in the mid forearm.  Left: No evidence of superficial vein thrombosis in the upper extremity. Findings consistent with acute deep vein thrombosis involving the left subclavian vein.  *See table(s) above for measurements and observations.  Diagnosing physician: Sherald Hess MD Electronically signed by Sherald Hess MD on 08/08/2023 at 3:49:15 PM.    Final    ECHOCARDIOGRAM COMPLETE  Result Date: 08/07/2023    ECHOCARDIOGRAM REPORT   Patient Name:   Christina Vasquez Date of Exam: 08/07/2023 Medical Rec #:  [id number]       Height:       62.0 in Accession #:    [id number]      Weight:       214.7 lb Date of Birth:  1956/10/23       BSA:          1.970 m Patient Age:    66 years        BP:           155/87 mmHg Patient Gender: F               HR:           69 bpm. Exam Location:  Inpatient Procedure: 2D Echo, Color Doppler, Cardiac Doppler and Intracardiac            Opacification Agent Indications:    R07.9* Chest pain, unspecified  History:        Patient has prior history of Echocardiogram examinations, most                 recent 05/27/2018. Risk Factors:Hypertension, Sleep Apnea and                 ESRD.  Sonographer:    Irving Burton Senior RDCS Referring Phys: [id number] PETER E BABCOCK IMPRESSIONS  1. Left ventricular ejection fraction, by estimation, is 60 to 65%. Left ventricular ejection fraction by PLAX is 60 %. The left ventricle has normal function. The left ventricle has no regional wall motion abnormalities. Left ventricular diastolic parameters are consistent with Grade I diastolic dysfunction (impaired relaxation).  2.

## 2023-08-09 NOTE — TOC Initial Note (Signed)
Transition of Care [facility]) - Initial/Assessment Note    Patient Details  Name: Christina Vasquez MRN: [id number] Date of Birth: October 27, 1956  Transition of Care [facility]) CM/SW Contact:    Gala Lewandowsky, RN Phone Number: 08/09/2023, 2:52 PM  Clinical Narrative: Patient presented for hypertensive emergency. Patient is aware that Case Manager is unable to provide blood pressure cuff for home. Patient states she can afford to purchase one. We discussed the [employer] at [facility]. Patient declined [facility] RN for disease management and v/s checks in the home. No further needs identified at this time.                   Expected Discharge Plan: Home/Self Care Barriers to Discharge: No Barriers Identified   Patient Goals and CMS Choice Patient states their goals for this hospitalization and ongoing recovery are:: to return home.  Expected Discharge Plan and Services In-house Referral: NA Discharge Planning Services: CM Consult   Living arrangements for the past 2 months: Apartment Expected Discharge Date: 08/09/23                [facility] Arranged: Patient Refused HH   Prior Living Arrangements/Services Living arrangements for the past 2 months: Apartment   Patient language and need for interpreter reviewed:: Yes        Need for Family Participation in Patient Care: Yes (Comment) Care giver support system in place?: Yes (comment)   Criminal Activity/Legal Involvement Pertinent to Current Situation/Hospitalization: No - Comment as needed  Activities of Daily Living   ADL Screening (condition at time of admission) Independently performs ADLs?: Yes (appropriate for developmental age) Does the patient have a NEW difficulty with bathing/dressing/toileting/self-feeding that is expected to last >3 days?: No Does the patient have a NEW difficulty with getting in/out of bed, walking, or climbing stairs that is expected to last >3 days?: No Does the patient have a NEW difficulty with  communication that is expected to last >3 days?: No Is the patient deaf or have difficulty hearing?: No Does the patient have difficulty seeing, even when wearing glasses/contacts?: No Does the patient have difficulty concentrating, remembering, or making decisions?: Yes (forgetful)  Permission Sought/Granted Permission sought to share information with : Case Manager     Emotional Assessment Appearance:: Appears stated age  Alcohol / Substance Use: Not Applicable Psych Involvement: No (comment)  Admission diagnosis:  Speech problem [R47.9] PRES (posterior reversible encephalopathy syndrome) [I67.83] Hypertensive emergency [I16.1] Patient Active Problem List   Diagnosis Date Noted   PRES (posterior reversible encephalopathy syndrome) 08/07/2023   ESRD on hemodialysis (HCC) 08/07/2023   OSA (obstructive sleep apnea) 08/07/2023   Anemia associated with chronic renal failure 08/07/2023   Arm paresthesia, right 08/07/2023   Hypertensive emergency 08/06/2023   Status post reverse total replacement of left shoulder 09/21/2022   Status post reverse total arthroplasty of right shoulder 06/01/2022   PCP:  Alyson Ingles, PA-C Pharmacy:   [id number] [place], [place] - [address] AT [facility] [address] & [facility] [address] [place] [address] Phone: [phone] Fax: [phone]  [facility] [address] [place] [address] Phone: [phone] Fax: [phone]  Social Determinants of Health (SDOH) Social History: SDOH Screenings   Food Insecurity: No Food Insecurity (08/06/2023)  Housing: Low Risk  (08/06/2023)  Transportation Needs: No Transportation Needs (08/06/2023)  Utilities: Not At Risk (08/06/2023)  Financial Resource Strain: Low Risk  (07/04/2018)   Received from  [facility]  Physical Activity: Unknown (07/04/2018)   Received from [facility], [facility]  Social Connections: Moderately Isolated (07/04/2018)   Received from [facility], [facility]  Stress: Stress Concern Present (07/04/2018)   Received from [facility], [facility]  Tobacco Use: Low Risk  (08/08/2023)   Readmission Risk Interventions     No data to display

## 2023-08-09 NOTE — Progress Notes (Signed)
NAME:  Christina Vasquez, MRN:  [id number], DOB:  July 21, 1956, LOS: 3 ADMISSION DATE:  08/06/2023, CONSULTATION DATE:  9/30 REFERRING MD:  Tegeler, CHIEF COMPLAINT:  hypertensive emergency and PRES   History of Present Illness:  This is a pleasant 67 year old female patient with end-stage renal disease and hypertension.  Also has had 2 failed dialysis access sites the left fistula, and failed right graft with associated significant nerve discomfort undergoes dialysis on Monday/Wednesday/Fridays.  Was in usual state of health until 9/28 when noted new onset of headache and intermittent chest discomfort.  The onset of the chest discomfort was not related to activity, and primarily over the right side of the chest, there was no neck or left arm discomfort however the headache persisted.  She has had no change in medications.  She has been taking her medications as prescribed.  Her dry weight has been stable at 98.5 kg.  She presented to dialysis on 9/30 was about 1 hour into therapy noting fairly significant headache and just not feeling well.  She became progressively confused and EMS was called.  On arrival blood pressure was 223/113.  Glucose 67, a code stroke was initially called and she was emergently brought to CT scanner.  This was negative, a subsequent MRI was obtained this showed T2 flair with hyperintense signal at bilateral occipital and posterior temporal lobes consistent with PRES, she was started on a Cleviprex drip, and critical care asked to admit.   Pertinent  Medical History  Hypertension, end-stage renal disease on Monday/Wednesday/Friday schedule dry weight 98.5 kg Thrombosed left upper arm fistula status post thrombectomy and surgical repair New right AV graft w/ sig pain felt 2/2 nerve pain reversed on 9/17 IV tunneled cath on right side Anemia of renal disease, hyperparathyroidism, depression, postoperative nausea vomited [place] sleep apnea not on CPAP, anxiety.  Significant Hospital  Events: Including procedures, antibiotic start and stop dates in addition to other pertinent events   Admitted with press on 9/30  Interim History / Subjective:  Now Bps are low with some nausea/dizziness after HD yesterday. Stable R hand numbness.  Objective   Blood pressure (!) 115/59, pulse 80, temperature 99.7 F (37.6 C), temperature source Oral, resp. rate 16, weight 98 kg, SpO2 95%.        Intake/Output Summary (Last 24 hours) at 08/09/2023 0804 Last data filed at 08/08/2023 1900 Gross per 24 hour  Intake 4.63 ml  Output 1100 ml  Net -1095.37 ml   Filed Weights   08/08/23 0500 08/09/23 0500  Weight: 96.9 kg 98 kg    Examination: No distress Pupils equal R hand numbness/weakness stable from yesterday Lungs clear Heart sounds regular Left fistula good thrill  BMP yesterday okay  Resolved Hospital Problem list     Assessment & Plan:   Hypertensive emergency w/ PRES End-stage renal disease Anemia in the setting of end-stage renal disease Right arm pain and paresthesia.  Status post removal of AVG on 9/17 due to steal symptoms.  VVS: nothing urgent diagnostic to do L subclavian DVT- associated with recent fistula ligation 06/01/23; will treat as provoked  - Continue coreg, drop hydralazine to 50 tid - Continue amlodipine - iHD per nephrology (MWF) - Eliquis x 3 months then repeat LUE [place], if clot is gone can be off [facility] - Home potentially this afternoon depending on BPs - Will need neurology f/u for PRES and RUE traumatic neuropathy  Best Practice (right click and "Reselect all SmartList Selections" daily)   Diet/type: Regular  consistency (see orders) DVT prophylaxis: prophylactic heparin  GI prophylaxis: N/A Lines: N/A Foley:  N/A Code Status:  full code Last date of multidisciplinary goals of care discussion [N/A]   Myrla Halsted MD PCCM

## 2023-08-09 NOTE — Progress Notes (Signed)
D/C order noted. Contacted [facility] to advise clinic of pt's d/c today and that pt should resume care tomorrow.   Melven Sartorius Renal Navigator [phone]

## 2023-08-14 ENCOUNTER — Encounter: Payer: Self-pay | Admitting: Neurology

## 2023-08-14 ENCOUNTER — Telehealth: Payer: Self-pay | Admitting: Neurology

## 2023-08-14 ENCOUNTER — Ambulatory Visit: Payer: Medicare Other | Admitting: Neurology

## 2023-08-14 VITALS — BP 146/82 | Ht 62.0 in | Wt 218.0 lb

## 2023-08-14 DIAGNOSIS — R202 Paresthesia of skin: Secondary | ICD-10-CM

## 2023-08-14 DIAGNOSIS — I6783 Posterior reversible encephalopathy syndrome: Secondary | ICD-10-CM

## 2023-08-14 DIAGNOSIS — R410 Disorientation, unspecified: Secondary | ICD-10-CM | POA: Diagnosis not present

## 2023-08-14 MED ORDER — GABAPENTIN 100 MG PO CAPS: 200.0000 mg | ORAL_CAPSULE | Freq: Three times a day (TID) | ORAL | 5 refills | Status: AC

## 2023-08-14 NOTE — Progress Notes (Signed)
Chief Complaint  Patient presents with   New Patient (Initial Visit)    Rm 14, Np right hand weakness and numbness post graft for dialysis, now has chest port       ASSESSMENT AND PLAN  Christina Vasquez is a 67 y.o. female   Right finger paresthesia weakness since right upper arm AV graft on July 24, 2023  Sensory loss in the distribution of right median nerve, significant right first 3 finger flexion weakness, suggestive of right median nerve neuropathy  EMG nerve conduction study Posterior reverse encephalopathy in September 2024  Abnormal MRI of the brain, evidence of bilateral occipital, posterior temporal hyperintensity signal abnormality,  EEG,  Repeat MRI of the brain to ensure recovery  DIAGNOSTIC DATA (LABS, IMAGING, TESTING) - I reviewed patient records, labs, notes, testing and imaging myself where available.   MEDICAL HISTORY:  Christina Vasquez, seen in request by vascular   Maeola Harman, MD Eldred Manges, Darci Current, PA-C   History is obtained from the patient and review of electronic medical records. I personally reviewed pertinent available imaging films in PACS.   PMHx of  ESRD- HD since 2020, MWF HTN Depression OSA, non compliance with CPAP GERD  She had renal disease, underwent right upper arm AV graft on July 24, 2023 by Dr. Waverly Ferrari, the basilar vein of the right arm was described as small, empty into the brachial system early, there was no good option for fistula, therefore the choice was made to place a upper arm graft, between the high brachial vein to artery  As soon as she woke up from anesthesia, she noticed numbness tingling pain to the right lateral forearm, weakness of the right first 3 fingers, not able to hold a pencil and fork, arterial Doppler of right upper extremity demonstrate triphasic waveform throughout the brachial radial and ulnar arteries, was diagnosed with steal syndrome,  It was so painful, she  had a second admission on July 31, 2023, then the graft was removed.  ER presentation August 06, 2023 for difficulty talking, confusion, also complains of headache, chest discomfort,  Was diagnosed with hypertensive emergency arriving blood pressure 223/113, MRI showed hyperintensity signal at bilateral occipital posterior temporal lobe, favored a diagnosis of PRES, additional scattered small vessel disease  MRI of the neck and brain showed no significant large vessel disease  Hospital admission also observed labile blood pressure, high imbalance, her antihypertensive regimen modified,  Also found left subclavian artery DVT, Eliquis for 23-month, then repeat ultrasound, if clot is gone, will be off anticoagulation   PHYSICAL EXAM:   Vitals:   08/14/23 1245  BP: (!) 146/82  Weight: 218 lb (98.9 kg)  Height: 5\' 2"  (1.575 m)   Body mass index is 39.87 kg/m.  PHYSICAL EXAMNIATION:  Gen: NAD, conversant, well nourised, well groomed                     Cardiovascular: Regular rate rhythm, no peripheral edema, warm, nontender. Eyes: Conjunctivae clear without exudates or hemorrhage Neck: Supple, no carotid bruits. Pulmonary: Clear to auscultation bilaterally   NEUROLOGICAL EXAM:  MENTAL STATUS: Speech/cognition: Awake, alert, oriented to history taking and casual conversation CRANIAL NERVES: CN II: Visual fields are full to confrontation. Pupils are round equal and briskly reactive to light. CN III, IV, VI: extraocular movement are normal. No ptosis. CN V: Facial sensation is intact to light touch CN VII: Face is symmetric with normal eye closure  CN VIII: Hearing is normal  to causal conversation. CN IX, X: Phonation is normal. CN XI: Head turning and shoulder shrug are intact  MOTOR: Dry right antecubital fossa scar, still have tenderness upon palpation, mild weakness of right pronation also limited by the pain, fairly normal supination, slight right wrist flexion  weakness, profound right first 3 finger flexion weakness   REFLEXES: Reflexes are 2+ and symmetric at the biceps, triceps, knees, and ankles. Plantar responses are flexor.  SENSORY: Intact to light touch, pinprick and vibratory sensation are intact in fingers and toes.  COORDINATION: There is no trunk or limb dysmetria noted.  GAIT/STANCE: Push-up to get up from seated position, steady gait, limited by her big body habitus  REVIEW OF SYSTEMS:  Full 14 system review of systems performed and notable only for as above All other review of systems were negative.   ALLERGIES: Allergies  Allergen Reactions   Meloxicam Hives and Rash   Augmentin [Amoxicillin-Pot Clavulanate] Nausea And Vomiting   Tetracyclines & Related Hives    HOME MEDICATIONS: Current Outpatient Medications  Medication Sig Dispense Refill   acetaminophen (TYLENOL) 650 MG CR tablet Take 1,300 mg by mouth every 8 (eight) hours as needed for pain.     amLODipine (NORVASC) 10 MG tablet Take 10 mg by mouth daily.     apixaban (ELIQUIS) 5 MG TABS tablet Take 1 tablet (5 mg total) by mouth 2 (two) times daily. 60 tablet 2   carvedilol (COREG) 25 MG tablet Take 1 tablet (25 mg total) by mouth 2 (two) times daily with a meal. Do not take on days before dialysis Do not take after dialysis if systolic blood pressure is less than 110. 60 tablet 2   cinacalcet (SENSIPAR) 60 MG tablet Take 60 mg by mouth every Monday, Wednesday, and Friday with hemodialysis.     folic acid (FOLVITE) 1 MG tablet Take 1 tablet (1 mg total) by mouth daily. 90 tablet 0   hydrALAZINE (APRESOLINE) 50 MG tablet Take 1 tablet (50 mg total) by mouth every 8 (eight) hours. Do not take on days before dialysis Do not take after dialysis if systolic blood pressure is less than 110. 90 tablet 2   omeprazole (PRILOSEC) 40 MG capsule Take 40 mg by mouth daily.     oxyCODONE (OXY IR/ROXICODONE) 5 MG immediate release tablet Take 1 tablet (5 mg total) by mouth  every 6 (six) hours as needed for severe pain. 10 tablet 0   sucroferric oxyhydroxide (VELPHORO) 500 MG chewable tablet Chew 1,500 mg by mouth 3 (three) times daily with meals.     venlafaxine XR (EFFEXOR-XR) 37.5 MG 24 hr capsule Take 37.5 mg by mouth daily with breakfast.     VITAMIN D PO Take 4 capsules by mouth every Monday, Wednesday, and Friday with hemodialysis.     Current Facility-Administered Medications  Medication Dose Route Frequency Provider Last Rate Last Admin   diclofenac Sodium (VOLTAREN) 1 % topical gel 4 g  4 g Topical QID Huel Cote, MD        PAST MEDICAL HISTORY: Past Medical History:  Diagnosis Date   Anemia    Anxiety    Arthritis    Hyperparathyroidism (HCC)    Hypertension    Major depressive disorder 08/15/2018   PONV (postoperative nausea and vomiting)    Happened one time. Has had sx since then with no N/V   Renal disorder 12/19/2018   Stage 4 ESRD. HD-MWF   Sleep apnea    does not use CPAP  PAST SURGICAL HISTORY: Past Surgical History:  Procedure Laterality Date   A/V FISTULAGRAM  2019   ABDOMINAL HYSTERECTOMY  2005   AV FISTULA PLACEMENT Right 07/24/2023   Procedure: RIGHT UPPER EXTREMITY ARTERIOVENOUS GRAFT CREATION;  Surgeon: Chuck Hint, MD;  Location: [facility] OR;  Service: Vascular;  Laterality: Right;   AVGG REMOVAL Right 07/31/2023   Procedure: REMOVAL OF RIGHT ARM ARTERIOVENOUS GORETEX GRAFT (AVGG);  Surgeon: Daria Pastures, MD;  Location: [facility] OR;  Service: Vascular;  Laterality: Right;   BREAST EXCISIONAL BIOPSY Left 2017   scar not visible   CHOLECYSTECTOMY  2002   COLONOSCOPY  2010   COLONOSCOPY WITH PROPOFOL N/A 01/02/2023   Procedure: COLONOSCOPY WITH PROPOFOL;  Surgeon: Jeani Hawking, MD;  Location: [facility];  Service: Gastroenterology;  Laterality: N/A;   IR AV DIALY SHUNT INTRO NEEDLE/INTRAC INITIAL W/PTA/STENT/IMG LT  2021   Stent placed in Left Arm Fistula. Placed in [place]   LIGATION OF ARTERIOVENOUS   FISTULA Left 06/01/2023   Procedure: LIGATION OF LEFT ARM ARTERIOVENOUS  FISTULA WITH HEMATOMA EVACUATION;  Surgeon: Maeola Harman, MD;  Location: [facility] OR;  Service: Vascular;  Laterality: Left;   POLYPECTOMY  01/02/2023   Procedure: POLYPECTOMY;  Surgeon: Jeani Hawking, MD;  Location: [facility];  Service: Gastroenterology;;   REVERSE SHOULDER ARTHROPLASTY Right 06/01/2022   Procedure: REVERSE SHOULDER ARTHROPLASTY;  Surgeon: Bjorn Pippin, MD;  Location: [facility] OR;  Service: Orthopedics;  Laterality: Right;   REVERSE SHOULDER ARTHROPLASTY Left 09/21/2022   Procedure: REVERSE SHOULDER ARTHROPLASTY;  Surgeon: Bjorn Pippin, MD;  Location: [facility] OR;  Service: Orthopedics;  Laterality: Left;   TOTAL KNEE ARTHROPLASTY Right 2008   TUBAL LIGATION      FAMILY HISTORY: Family History  Problem Relation Age of Onset   Breast cancer Sister    Breast cancer Cousin        maternal first cousin    SOCIAL HISTORY: Social History   Socioeconomic History   Marital status: Widowed    Spouse name: Not on file   Number of children: 2   Years of education: Not on file   Highest education level: Not on file  Occupational History   Not on file  Tobacco Use   Smoking status: Never   Smokeless tobacco: Never  Vaping Use   Vaping status: Never Used  Substance and Sexual Activity   Alcohol use: Not Currently   Drug use: Never   Sexual activity: Not Currently    Birth control/protection: Surgical    Comment: Hysterectomy  Other Topics Concern   Not on file  Social History Narrative   Right handed   Caffeine-occasionally   Live alone, windowed   Social Determinants of Health   Financial Resource Strain: Low Risk  (07/04/2018)   Received from [facility], [facility]   Overall Financial Resource Strain (CARDIA)    Difficulty of Paying Living Expenses: Not hard at all  Food Insecurity: No Food Insecurity (08/06/2023)   Hunger Vital Sign    Worried  About Running Out of Food in the Last Year: Never true    Ran Out of Food in the Last Year: Never true  Transportation Needs: No Transportation Needs (08/06/2023)   PRAPARE - Administrator, Civil Service (Medical): No    Lack of Transportation (Non-Medical): No  Physical Activity: Unknown (07/04/2018)   Received from [facility], [facility]   Exercise Vital Sign  Days of Exercise per Week: 0 days    Minutes of Exercise per Session: Not on file  Stress: Stress Concern Present (07/04/2018)   Received from [facility], [facility]   [employer] of Occupational Health - Occupational Stress Questionnaire    Feeling of Stress : Very much  Social Connections: Moderately Isolated (07/04/2018)   Received from [facility], [facility]   Social Connection and Isolation Panel [NHANES]    Frequency of Communication with Friends and Family: Twice a week    Frequency of Social Gatherings with Friends and Family: Never    Attends Religious Services: More than 4 times per year    Active Member of [employer] or Organizations: No    Attends Banker Meetings: Never    Marital Status: Widowed  Intimate Partner Violence: Not At Risk (08/06/2023)   Humiliation, Afraid, Rape, and Kick questionnaire    Fear of Current or Ex-Partner: No    Emotionally Abused: No    Physically Abused: No    Sexually Abused: No      Levert Feinstein, M.D. Ph.D.  [facility] [address] [place], [place] [address] Ph: [phone] Fax: [phone]  CC:  Maeola Harman, MD [address] [place],  [place] [address]  Alyson Ingles, [place]

## 2023-08-14 NOTE — Telephone Encounter (Signed)
[employer] medicare NPR sent to GI [phone] 

## 2023-08-15 ENCOUNTER — Ambulatory Visit (HOSPITAL_COMMUNITY)
Admission: RE | Admit: 2023-08-15 | Discharge: 2023-08-15 | Disposition: A | Discharge status: Home / Self Care | Payer: Medicare Other | Source: Ambulatory Visit | Attending: Surgery | Admitting: Surgery

## 2023-08-15 DIAGNOSIS — I82409 Acute embolism and thrombosis of unspecified deep veins of unspecified lower extremity: Secondary | ICD-10-CM | POA: Insufficient documentation

## 2023-08-15 DIAGNOSIS — I82622 Acute embolism and thrombosis of deep veins of left upper extremity: Secondary | ICD-10-CM | POA: Diagnosis present

## 2023-08-15 MED ORDER — APIXABAN 5 MG PO TABS: 5.0000 mg | ORAL_TABLET | Freq: Two times a day (BID) | ORAL | 0 refills | Status: DC

## 2023-08-15 NOTE — Patient Instructions (Signed)
-  Continue Eliquis 5 mg (1 tablet) twice daily.  -Your refills have been sent to your [facility] on [address]. You may need to call the pharmacy to ask them to fill this when you start to run low on your current supply.  -Follow up with Dr. Hetty Blend on Friday.  -It is important to take your medication around the same time every day.  -Avoid NSAIDs like ibuprofen (Advil, Motrin) and naproxen (Aleve) as well as aspirin doses over 100 mg daily. -Tylenol (acetaminophen) is the preferred over the counter pain medication to lower the risk of bleeding. -Be sure to alert all of your health care providers that you are taking an anticoagulant prior to starting a new medication or having a procedure. -Monitor for signs and symptoms of bleeding (abnormal bruising, prolonged bleeding, nose bleeds, bleeding from gums, discolored urine, black tarry stools). If you have fallen and hit your head OR if your bleeding is severe or not stopping, seek emergency care.  -Go to the emergency room if emergent signs and symptoms of new clot occur (new or worse swelling and pain in an arm or leg, shortness of breath, chest pain, fast or irregular heartbeats, lightheadedness, dizziness, fainting, coughing up blood) or if you experience a significant color change (pale or blue) in the extremity that has the DVT.   If you have any questions or need to reschedule an appointment, please call [phone] [facility].  If you are having an emergency, call 911 or present to the nearest emergency room.   What is a DVT?  -Deep vein thrombosis (DVT) is a condition in which a blood clot forms in a vein of the deep venous system which can occur in the lower leg, thigh, pelvis, arm, or neck. This condition is serious and can be life-threatening if the clot travels to the arteries of the lungs and causing a blockage (pulmonary embolism, PE). A DVT can also damage veins in the leg, which can lead to long-term venous disease, leg pain,  swelling, discoloration, and ulcers or sores (post-thrombotic syndrome).  -Treatment may include taking an anticoagulant medication to prevent more clots from forming and the current clot from growing, wearing compression stockings, and/or surgical procedures to remove or dissolve the clot.

## 2023-08-15 NOTE — Progress Notes (Cosign Needed)
DVT Clinic Note  Name: Christina Vasquez     MRN: [id number]     DOB: 05-28-1956     Sex: female  PCP: Alyson Ingles, PA-C  Today's Visit: Visit Information: Initial Visit  Referred to DVT Clinic by:  [facility] - PCCM  - Joneen Roach, NP Referred to CPP by: Dr. Myra Gianotti Reason for referral:  Chief Complaint  Patient presents with   Med Management - DVT   HISTORY OF PRESENT ILLNESS: Christina Vasquez is a 67 y.o. female with PMH ESRD on HD, HTN, who presents after diagnosis of DVT for medication management. She was recently admitted to [facility] 9/30-10/3/24 for PRES. During this hospitalization she was found to have an acute DVT in her left subclavian vein felt to be provoked by her recent left AV fistula ligation in July. She was started on treatment with Eliquis with plans to anticoagulate the provoked DVT for 3 months. She was referred to the DVT Clinic at hospital discharge to help with affording Eliquis as she is currently in the donut hole.  Today patient reports that the swelling in her left arm has significantly improved. She is still having significant tingling and numbness in her right arm. Denies abnormal bleeding or bruising. Denies missed doses of Eliquis. She is concerned about her ability to afford the remaining months of her Eliquis treatment.   Positive Thrombotic Risk Factors: Recent surgery (within 3 months), Recent admission to hospital with acute illness (within 3 months), Obesity, Older Age, Central venous catheterization Bleeding Risk Factors: Age >65 years, Renal failure, Anticoagulant therapy  Negative Thrombotic Risk Factors: Previous VTE, Recent trauma (within 3 months), Paralysis, paresis, or recent plaster cast immobilization of lower extremity, Bed rest >72 hours within 3 months, Sedentary journey lasting >8 hours within 4 weeks, Pregnancy, Within 6 weeks postpartum, Recent cesarean section (within 3 months), Estrogen therapy, Testosterone  therapy, Erythropoiesis-stimulating agent, Recent COVID diagnosis (within 3 months), Active cancer, Non-malignant, chronic inflammatory condition, Known thrombophilic condition, Smoking  Rx Insurance Coverage: Medicare Rx Affordability: Patient is currently in the donut hole, so Eliquis costs $149/month Rx Assistance Provided: Medication samples Preferred Pharmacy: [facility] on [address]   Past Medical History:  Diagnosis Date   Anemia    Anxiety    Arthritis    Hyperparathyroidism (HCC)    Hypertension    Major depressive disorder 08/15/2018   PONV (postoperative nausea and vomiting)    Happened one time. Has had sx since then with no N/V   Renal disorder 12/19/2018   Stage 4 ESRD. HD-MWF   Sleep apnea    does not use CPAP    Past Surgical History:  Procedure Laterality Date   A/V FISTULAGRAM  2019   ABDOMINAL HYSTERECTOMY  2005   AV FISTULA PLACEMENT Right 07/24/2023   Procedure: RIGHT UPPER EXTREMITY ARTERIOVENOUS GRAFT CREATION;  Surgeon: Chuck Hint, MD;  Location: [facility] OR;  Service: Vascular;  Laterality: Right;   AVGG REMOVAL Right 07/31/2023   Procedure: REMOVAL OF RIGHT ARM ARTERIOVENOUS GORETEX GRAFT (AVGG);  Surgeon: Daria Pastures, MD;  Location: [facility] OR;  Service: Vascular;  Laterality: Right;   BREAST EXCISIONAL BIOPSY Left 2017   scar not visible   CHOLECYSTECTOMY  2002   COLONOSCOPY  2010   COLONOSCOPY WITH PROPOFOL N/A 01/02/2023   Procedure: COLONOSCOPY WITH PROPOFOL;  Surgeon: Jeani Hawking, MD;  Location: [facility]COPY;  Service: Gastroenterology;  Laterality: N/A;   IR AV DIALY SHUNT INTRO NEEDLE/INTRAC INITIAL W/PTA/STENT/IMG LT  2021   Stent placed in Left Arm Fistula. Placed in [place]   LIGATION OF ARTERIOVENOUS  FISTULA Left 06/01/2023   Procedure: LIGATION OF LEFT ARM ARTERIOVENOUS  FISTULA WITH HEMATOMA EVACUATION;  Surgeon: Maeola Harman, MD;  Location: [facility] OR;  Service: Vascular;  Laterality: Left;   POLYPECTOMY  01/02/2023    Procedure: POLYPECTOMY;  Surgeon: Jeani Hawking, MD;  Location: [facility];  Service: Gastroenterology;;   REVERSE SHOULDER ARTHROPLASTY Right 06/01/2022   Procedure: REVERSE SHOULDER ARTHROPLASTY;  Surgeon: Bjorn Pippin, MD;  Location: [facility] OR;  Service: Orthopedics;  Laterality: Right;   REVERSE SHOULDER ARTHROPLASTY Left 09/21/2022   Procedure: REVERSE SHOULDER ARTHROPLASTY;  Surgeon: Bjorn Pippin, MD;  Location: [facility] OR;  Service: Orthopedics;  Laterality: Left;   TOTAL KNEE ARTHROPLASTY Right 2008   TUBAL LIGATION      Social History   Socioeconomic History   Marital status: Widowed    Spouse name: Not on file   Number of children: 2   Years of education: Not on file   Highest education level: Not on file  Occupational History   Not on file  Tobacco Use   Smoking status: Never   Smokeless tobacco: Never  Vaping Use   Vaping status: Never Used  Substance and Sexual Activity   Alcohol use: Not Currently   Drug use: Never   Sexual activity: Not Currently    Birth control/protection: Surgical    Comment: Hysterectomy  Other Topics Concern   Not on file  Social History Narrative   Right handed   Caffeine-occasionally   Live alone, windowed   Social Determinants of Health   Financial Resource Strain: Low Risk  (07/04/2018)   Received from [facility], [facility]   Overall Financial Resource Strain (CARDIA)    Difficulty of Paying Living Expenses: Not hard at all  Food Insecurity: No Food Insecurity (08/06/2023)   Hunger Vital Sign    Worried About Running Out of Food in the Last Year: Never true    Ran Out of Food in the Last Year: Never true  Transportation Needs: No Transportation Needs (08/06/2023)   PRAPARE - Administrator, Civil Service (Medical): No    Lack of Transportation (Non-Medical): No  Physical Activity: Unknown (07/04/2018)   Received from [facility], [facility]    Exercise Vital Sign    Days of Exercise per Week: 0 days    Minutes of Exercise per Session: Not on file  Stress: Stress Concern Present (07/04/2018)   Received from [facility], [facility]   [employer] of Occupational Health - Occupational Stress Questionnaire    Feeling of Stress : Very much  Social Connections: Moderately Isolated (07/04/2018)   Received from [facility], [facility]   Social Connection and Isolation Panel [NHANES]    Frequency of Communication with Friends and Family: Twice a week    Frequency of Social Gatherings with Friends and Family: Never    Attends Religious Services: More than 4 times per year    Active Member of [employer] or Organizations: No    Attends Banker Meetings: Never    Marital Status: Widowed  Intimate Partner Violence: Not At Risk (08/06/2023)   Humiliation, Afraid, Rape, and Kick questionnaire    Fear of Current or Ex-Partner: No    Emotionally Abused: No    Physically Abused: No    Sexually  Abused: No    Family History  Problem Relation Age of Onset   Breast cancer Sister    Breast cancer Cousin        maternal first cousin    Allergies as of 08/15/2023 - Review Complete 08/15/2023  Allergen Reaction Noted   Meloxicam Hives and Rash 08/20/2013   Augmentin [amoxicillin-pot clavulanate] Nausea And Vomiting 05/08/2023   Tetracyclines & related Hives 09/19/2015    Current Outpatient Medications on File Prior to Encounter  Medication Sig Dispense Refill   acetaminophen (TYLENOL) 650 MG CR tablet Take 1,300 mg by mouth every 8 (eight) hours as needed for pain.     amLODipine (NORVASC) 10 MG tablet Take 10 mg by mouth daily.     carvedilol (COREG) 25 MG tablet Take 1 tablet (25 mg total) by mouth 2 (two) times daily with a meal. Do not take on days before dialysis Do not take after dialysis if systolic blood pressure is less than 110. 60 tablet 2    cinacalcet (SENSIPAR) 60 MG tablet Take 60 mg by mouth every Monday, Wednesday, and Friday with hemodialysis.     folic acid (FOLVITE) 1 MG tablet Take 1 tablet (1 mg total) by mouth daily. 90 tablet 0   gabapentin (NEURONTIN) 100 MG capsule Take 2 capsules (200 mg total) by mouth 3 (three) times daily. 180 capsule 5   hydrALAZINE (APRESOLINE) 50 MG tablet Take 1 tablet (50 mg total) by mouth every 8 (eight) hours. Do not take on days before dialysis Do not take after dialysis if systolic blood pressure is less than 110. 90 tablet 2   omeprazole (PRILOSEC) 40 MG capsule Take 40 mg by mouth daily.     sucroferric oxyhydroxide (VELPHORO) 500 MG chewable tablet Chew 1,500 mg by mouth 3 (three) times daily with meals.     venlafaxine XR (EFFEXOR-XR) 37.5 MG 24 hr capsule Take 37.5 mg by mouth daily with breakfast.     VITAMIN D PO Take 4 capsules by mouth every Monday, Wednesday, and Friday with hemodialysis.     oxyCODONE (OXY IR/ROXICODONE) 5 MG immediate release tablet Take 1 tablet (5 mg total) by mouth every 6 (six) hours as needed for severe pain. (Patient not taking: Reported on 08/15/2023) 10 tablet 0   No current facility-administered medications on file prior to encounter.   LABS:  CBC     Component Value Date/Time   WBC 6.7 08/08/2023 0813   RBC 4.06 08/08/2023 0813   HGB 11.0 (L) 08/08/2023 0813   HGB 12.1 07/12/2023 0747   HCT 32.7 (L) 08/08/2023 0813   PLT 177 08/08/2023 0813   PLT 154 07/12/2023 0747   MCV 80.5 08/08/2023 0813   MCH 27.1 08/08/2023 0813   MCHC 33.6 08/08/2023 0813   RDW 15.9 (H) 08/08/2023 0813   LYMPHSABS 0.9 08/06/2023 1100   MONOABS 0.3 08/06/2023 1100   EOSABS 0.1 08/06/2023 1100   BASOSABS 0.0 08/06/2023 1100    Hepatic Function      Component Value Date/Time   PROT 6.6 08/06/2023 1100   ALBUMIN 3.5 08/06/2023 1100   AST 16 08/06/2023 1100   AST 15 07/12/2023 0747   ALT 15 08/06/2023 1100   ALT 14 07/12/2023 0747   ALKPHOS 73 08/06/2023 1100    BILITOT 0.6 08/06/2023 1100   BILITOT 0.4 07/12/2023 0747    Renal Function   Lab Results  Component Value Date   CREATININE 6.02 (H) 08/08/2023   CREATININE 6.83 (H) 08/07/2023  CREATININE 6.28 (H) 08/06/2023    Estimated Creatinine Clearance: 10.1 mL/min (A) (by C-G formula based on SCr of 6.02 mg/dL (H)).   [facility] Vascular Lab Studies:  08/08/23 VAS [place] UPPER EXTREMITY VENOUS DUPLEX BILATERAL  Right:  No evidence of deep vein thrombosis in the upper extremity. Findings  consistent with acute superficial vein thrombosis involving the right cephalic vein.  Superficial thrombus detected in the cephalic vein is surrounding an IV in  the mid forearm.   Left:  No evidence of superficial vein thrombosis in the upper extremity.  Findings consistent with acute deep vein thrombosis involving the left subclavian  vein.   ASSESSMENT: Location of DVT: Left upper extremity Cause of DVT: provoked by a transient risk factor  Patient was recently diagnosed with an acute DVT in the left subclavian vein, felt to be related to recent fistula ligation in July and is being treated as a provoked DVT with plans to anticoagulate for 3 months. She also had a superficial thrombus in the right cephalic vein around an IV in the forearm. Due to her kidney function, Eliquis is the best choice of anticoagulation for her. She is tolerating Eliquis well so far with no adverse effects and no missed doses.   She was referred to the DVT Clinic to help navigate medication access as she is currently in the coverage gap (donut hole) for Medicare, making Eliquis around $150/month which is unaffordable for her. After reviewing her annual income and pharmacy expense reports for 2024, she unfortunately does not meet the requirements for patient assistance through [employer] for Eliquis. Her income would meet it, but she has to have spent 3% of her annual income on out of pocket medication costs to qualify. She has only met about half  of that amount and will not meet 3% by the end of 2024. She also doesn't meet the income requirement for Medicare Extra Help. She used the one time free card to fill the first month at hospital discharge and needs access to the remaining 2 months of treatment. I was able to provide her with a one month supply of Eliquis samples today, and she tells me she will be able to pay the remaining month's co-pay by November when she runs out of her current supply between what she got from the hospital and samples I gave her today. This would complete her 3 months of anticoagulation. She will reach out if this changes.   Medication samples have been provided to the patient. Two LOTs were used to provide a total of a 28 day supply:   Drug name: Eliquis (apixaban)       Strength: 5 mg        Qty: 14 tablets  LOT: [id number]  Exp.Date: 06/05/24  Drug name: Eliquis (apixaban)       Strength: 5 mg        Qty: 42 tablets  LOT: [id number]  Exp.Date: 01/03/25  Dosing instructions: Take 5 mg (1 tablet) by mouth twice daily.  The patient has been instructed regarding the correct time, dose, and frequency of taking this medication, including desired effects and most common side effects.   PLAN: -Continue apixaban (Eliquis) 5 mg twice daily. -Expected duration of therapy: 3 months. Therapy started on 08/08/23. -Patient educated on purpose, proper use and potential adverse effects of apixaban (Eliquis). -Discussed importance of taking medication around the same time every day. -Advised patient of medications to avoid (NSAIDs, aspirin doses >100 mg daily). -Educated that Tylenol (acetaminophen)  is the preferred analgesic to lower the risk of bleeding. -Advised patient to alert all providers of anticoagulation therapy prior to starting a new medication or having a procedure. -Emphasized importance of monitoring for signs and symptoms of bleeding (abnormal bruising, prolonged bleeding, nose bleeds, bleeding from gums, discolored  urine, black tarry stools). -Educated patient to present to the ED if emergent signs and symptoms of new thrombosis occur.  Follow up: with Dr. Hetty Blend at appt on Friday. DVT Clinic available as needed.  Pervis Hocking, PharmD, [place], CPP Deep Vein Thrombosis Clinic Clinical Pharmacist Practitioner Office: [phone]  I have evaluated the patient's chart/imaging and refer this patient to the Clinical Pharmacist Practitioner for medication management. I have reviewed the CPP's documentation and agree with her assessment and plan. I was immediately available during the visit for questions and collaboration.   Durene Cal, MD

## 2023-08-16 NOTE — Progress Notes (Signed)
Patient ID: Christina Vasquez, female   DOB: 01/28/1956, 67 y.o.   MRN: [id number]  Reason for Consult: Follow-up   Referred by Alyson Ingles, PA*  Subjective:     HPI  Christina Vasquez is a 67 y.o. female who presents for follow-up after ligation and excision of right upper extremity AVG.  She had a right upper extremity AVG placed by Dr. 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  The AVG placement of the right arm was complicated by pain numbness and tingling of her hand and fingers which necessitated ligation and excision on 07/31/2023.  Her and her daughter are very frustrated because she feels as though she still has decreased function of her hand and cannot hold a pen or fork.  She still has numbness of her first, second and third fingers with decreased range of motion.  She is currently dialyzing through a right IJ TDC.  She was also recently diagnosed with a left axillary acute DVT and is on Eliquis.   Past Medical History:  Diagnosis Date   Anemia    Anxiety    Arthritis    Hyperparathyroidism (HCC)    Hypertension    Major depressive disorder 08/15/2018   PONV (postoperative nausea and vomiting)    Happened one time. Has had sx since then with no N/V   Renal disorder 12/19/2018   Stage 4 ESRD. HD-MWF   Sleep apnea    does not use CPAP   Family History  Problem Relation Age of Onset   Breast cancer Sister    Breast cancer Cousin        maternal first cousin   Past Surgical History:  Procedure Laterality Date   A/V FISTULAGRAM  2019   ABDOMINAL HYSTERECTOMY  2005   AV FISTULA PLACEMENT Right 07/24/2023   Procedure: RIGHT UPPER EXTREMITY ARTERIOVENOUS GRAFT CREATION;  Surgeon: Chuck Hint, MD;  Location: [facility] OR;  Service: Vascular;  Laterality: Right;   AVGG REMOVAL Right 07/31/2023   Procedure: REMOVAL OF RIGHT ARM ARTERIOVENOUS GORETEX GRAFT (AVGG);  Surgeon: Daria Pastures, MD;   Location: [facility] OR;  Service: Vascular;  Laterality: Right;   BREAST EXCISIONAL BIOPSY Left 2017   scar not visible   CHOLECYSTECTOMY  2002   COLONOSCOPY  2010   COLONOSCOPY WITH PROPOFOL N/A 01/02/2023   Procedure: COLONOSCOPY WITH PROPOFOL;  Surgeon: Jeani Hawking, MD;  Location: [facility];  Service: Gastroenterology;  Laterality: N/A;   IR AV DIALY SHUNT INTRO NEEDLE/INTRAC INITIAL W/PTA/STENT/IMG LT  2021   Stent placed in Left Arm Fistula. Placed in [place]   LIGATION OF ARTERIOVENOUS  FISTULA Left 06/01/2023   Procedure: LIGATION OF LEFT ARM ARTERIOVENOUS  FISTULA WITH HEMATOMA EVACUATION;  Surgeon: Maeola Harman, MD;  Location: [facility] OR;  Service: Vascular;  Laterality: Left;   POLYPECTOMY  01/02/2023   Procedure: POLYPECTOMY;  Surgeon: Jeani Hawking, MD;  Location: [facility];  Service: Gastroenterology;;   REVERSE SHOULDER ARTHROPLASTY Right 06/01/2022   Procedure: REVERSE SHOULDER ARTHROPLASTY;  Surgeon: Bjorn Pippin, MD;  Location: [facility] OR;  Service: Orthopedics;  Laterality: Right;   REVERSE SHOULDER ARTHROPLASTY Left 09/21/2022   Procedure: REVERSE SHOULDER ARTHROPLASTY;  Surgeon: Bjorn Pippin, MD;  Location: [facility] OR;  Service: Orthopedics;  Laterality: Left;   TOTAL KNEE ARTHROPLASTY Right 2008   TUBAL LIGATION      Short Social History:  Social History   Tobacco Use  Smoking status: Never   Smokeless tobacco: Never  Substance Use Topics   Alcohol use: Not Currently    Allergies  Allergen Reactions   Meloxicam Hives and Rash   Augmentin [Amoxicillin-Pot Clavulanate] Nausea And Vomiting   Tetracyclines & Related Hives    Current Outpatient Medications  Medication Sig Dispense Refill   acetaminophen (TYLENOL) 650 MG CR tablet Take 1,300 mg by mouth every 8 (eight) hours as needed for pain.     amLODipine (NORVASC) 10 MG tablet Take 10 mg by mouth daily.     apixaban (ELIQUIS) 5 MG TABS tablet Take 1 tablet (5 mg total) by mouth 2 (two) times daily. 60  tablet 0   carvedilol (COREG) 25 MG tablet Take 1 tablet (25 mg total) by mouth 2 (two) times daily with a meal. Do not take on days before dialysis Do not take after dialysis if systolic blood pressure is less than 110. 60 tablet 2   cinacalcet (SENSIPAR) 60 MG tablet Take 60 mg by mouth every Monday, Wednesday, and Friday with hemodialysis.     folic acid (FOLVITE) 1 MG tablet Take 1 tablet (1 mg total) by mouth daily. 90 tablet 0   gabapentin (NEURONTIN) 100 MG capsule Take 2 capsules (200 mg total) by mouth 3 (three) times daily. 180 capsule 5   hydrALAZINE (APRESOLINE) 50 MG tablet Take 1 tablet (50 mg total) by mouth every 8 (eight) hours. Do not take on days before dialysis Do not take after dialysis if systolic blood pressure is less than 110. 90 tablet 2   omeprazole (PRILOSEC) 40 MG capsule Take 40 mg by mouth daily.     oxyCODONE (OXY IR/ROXICODONE) 5 MG immediate release tablet Take 1 tablet (5 mg total) by mouth every 6 (six) hours as needed for severe pain. 10 tablet 0   sucroferric oxyhydroxide (VELPHORO) 500 MG chewable tablet Chew 1,500 mg by mouth 3 (three) times daily with meals.     venlafaxine XR (EFFEXOR-XR) 37.5 MG 24 hr capsule Take 37.5 mg by mouth daily with breakfast.     VITAMIN D PO Take 4 capsules by mouth every Monday, Wednesday, and Friday with hemodialysis.     No current facility-administered medications for this visit.    REVIEW OF SYSTEMS  Negative other than noted in HPI     Objective:  Objective   Vitals:   08/17/23 1021  BP: (!) 187/94  Pulse: 94  Temp: 97.7 F (36.5 C)  TempSrc: Temporal  SpO2: 96%  Weight: 219 lb 6.4 oz (99.5 kg)  Height: 5\' 2"  (1.575 m)   Body mass index is 40.13 kg/m.  Physical Exam General: no acute distress Cardiac: hemodynamically stable, nontachycardic Pulm: normal work of breathing Neuro: alert, no focal deficit Extremities: Right upper extremity incisions healing well, Dermabond in place, no drainage or  erythema. Vascular: Palpable radials bilaterally MSK: Decreased active range of motion of the right 1st through 3rd fingers.  Slightly decreased grip strength on the right   Data: Right Findings:  +----------+------------+---------+-----------+----------+-------+  RIGHT    CompressiblePhasicitySpontaneousPropertiesSummary  +----------+------------+---------+-----------+----------+-------+  IJV          Full       Yes       Yes                       +----------+------------+---------+-----------+----------+-------+  Subclavian   Full       Yes       Yes                       +----------+------------+---------+-----------+----------+-------+  Axillary     Full       Yes       Yes                       +----------+------------+---------+-----------+----------+-------+  Brachial     Full                                           +----------+------------+---------+-----------+----------+-------+  Radial       Full                                           +----------+------------+---------+-----------+----------+-------+  Ulnar        Full                                           +----------+------------+---------+-----------+----------+-------+  Cephalic     None                                   Acute   +----------+------------+---------+-----------+----------+-------+  Basilic      Full                                           +----------+------------+---------+-----------+----------+-------+   Superficial thrombus detected in the cephalic vein is surrounding an IV in  the mid forearm.    Left Findings:  +----------+------------+---------+-----------+----------+-------+  LEFT     CompressiblePhasicitySpontaneousPropertiesSummary  +----------+------------+---------+-----------+----------+-------+  IJV          Full       Yes       Yes                        +----------+------------+---------+-----------+----------+-------+  Subclavian   None       No        No                Acute   +----------+------------+---------+-----------+----------+-------+  Axillary     Full       Yes       Yes                       +----------+------------+---------+-----------+----------+-------+  Brachial     Full                                           +----------+------------+---------+-----------+----------+-------+  Radial       Full                                           +----------+------------+---------+-----------+----------+-------+  Ulnar        Full                                           +----------+------------+---------+-----------+----------+-------+  Cephalic     Full                                           +----------+------------+---------+-----------+----------+-------+  Basilic      Full                                           +----------+------------+---------+-----------+----------+-------+       Assessment/Plan:     Kenyarda Cardillo is a 67 y.o. female is ESRD who is status post right AVG ligation and excision on 07/31/2023 due to steal and possible IMN.  Her right hand is still not back to baseline and has decreased function.  I had an extensive discussion with the daughter and the patient about the plan going forward in order to address her hand symptoms as well as future access.  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  I explained that peripheral nerves can heal it is an extremely long process and will likely never be 100%.  We decided for the hand that she would have referral to Occupational Therapy as well as a orthopedic hand specialist.    In regards for her HD access I explained that the new finding of a left axillary DVT complicates things.  We ultimately decided that she would continue her Eliquis for 3  months and follow-up with a venous duplex to reassess.  I explained that if there still is an outflow obstruction a left upper extremity access would not be an option and we would have to decide between a repeat right arm access in which she already has had a complication versus lower extremity access versus lifelong TDC.     Daria Pastures MD [facility]

## 2023-08-17 ENCOUNTER — Ambulatory Visit: Payer: Medicare Other | Admitting: Vascular Surgery

## 2023-08-17 ENCOUNTER — Encounter: Payer: Self-pay | Admitting: Vascular Surgery

## 2023-08-17 VITALS — BP 187/94 | HR 94 | Temp 97.7°F | Ht 62.0 in | Wt 219.4 lb

## 2023-08-17 DIAGNOSIS — N186 End stage renal disease: Secondary | ICD-10-CM

## 2023-08-18 ENCOUNTER — Ambulatory Visit
Admission: RE | Admit: 2023-08-18 | Discharge: 2023-08-18 | Disposition: A | Discharge status: Home / Self Care | Payer: Medicare Other | Source: Ambulatory Visit | Attending: Neurology | Admitting: Neurology

## 2023-08-18 DIAGNOSIS — R202 Paresthesia of skin: Secondary | ICD-10-CM

## 2023-08-18 DIAGNOSIS — R410 Disorientation, unspecified: Secondary | ICD-10-CM

## 2023-08-18 DIAGNOSIS — I6783 Posterior reversible encephalopathy syndrome: Secondary | ICD-10-CM | POA: Diagnosis not present

## 2023-09-03 ENCOUNTER — Ambulatory Visit: Payer: Medicare Other | Admitting: Orthopedic Surgery

## 2023-09-03 ENCOUNTER — Ambulatory Visit (INDEPENDENT_AMBULATORY_CARE_PROVIDER_SITE_OTHER): Payer: Medicare Other

## 2023-09-03 ENCOUNTER — Ambulatory Visit (INDEPENDENT_AMBULATORY_CARE_PROVIDER_SITE_OTHER): Payer: Medicare Other | Admitting: Neurology

## 2023-09-03 DIAGNOSIS — R200 Anesthesia of skin: Secondary | ICD-10-CM | POA: Diagnosis not present

## 2023-09-03 DIAGNOSIS — G5601 Carpal tunnel syndrome, right upper limb: Secondary | ICD-10-CM

## 2023-09-03 DIAGNOSIS — R410 Disorientation, unspecified: Secondary | ICD-10-CM | POA: Diagnosis not present

## 2023-09-03 NOTE — Progress Notes (Signed)
Christina Vasquez - 67 y.o. female MRN [id number]  Date of birth: 1955/11/18  Office Visit Note: Visit Date: 09/03/2023 PCP: Carilyn Goodpasture, NP Referred by: Daria Pastures, MD  Subjective: No chief complaint on file.  HPI: Christina Vasquez is a pleasant 67 y.o. female who presents today for evaluation of right hand numbness and tingling in the radial sided digits with associated weakness.  She underwent recent AV graft surgery with subsequent removal approximately 1 month prior.  She states that after the initial AV graft placement at the right upper arm, she started to experience numbness and tingling in the radial sided digits with associated weakness.  She was subsequently seen by the vascular team and underwent AVG excision.  Her symptoms of numbness in the radial sided digits with associated weakness have persisted.  Pertinent ROS were reviewed with the patient and found to be negative unless otherwise specified above in HPI.   Visit Reason: Right hand Duration of symptoms: 6 weeks Hand dominance: right Occupation: Retired Diabetic: No Smoking: No Heart/Lung History: none Blood Thinners:  none  Prior Testing/EMG: none Injections (Date): none Treatments: none Prior Surgery:none  *had a graft put in around bicep area for dialysis; ever since then, her hand has been sore, and fingers tingle/numb*  Assessment & Plan: Visit Diagnoses:  1. Carpal tunnel syndrome, right upper limb     Plan: Extensive discussion was had the patient today regarding her right upper extremity complaints.  She is demonstrating median nerve symptoms throughout the right hand with numbness in the median nerve distribution without discernible 2-point discrimination and associated weakness to the APB and AIN musculature.  There is concern for potential median nerve injury versus compressive hematoma or potential neuropraxia in the setting of her recent upper extremity AV graft placement.  I have  recommended that she undergo an EMG as scheduled for the right upper extremity, we will see if we can expedite this study.  She will return to me after the EMG is complete to review results and discuss further treatment.  Follow-up: No follow-ups on file.   Meds & Orders: No orders of the defined types were placed in this encounter.   Orders Placed This Encounter  Procedures   XR Wrist Complete Right   Ambulatory referral to Physical Medicine Rehab     Procedures: No procedures performed      Clinical History: No specialty comments available.  She reports that she has never smoked. She has never used smokeless tobacco.  Recent Labs    08/06/23 1115  HGBA1C 5.2    Objective:   Vital Signs: There were no vitals taken for this visit.  Physical Exam  Gen: Well-appearing, in no acute distress; non-toxic CV: Regular Rate. Well-perfused. Warm.  Resp: Breathing unlabored on room air; no wheezing. Psych: Fluid speech in conversation; appropriate affect; Christina thought process  Ortho Exam PHYSICAL EXAM:  General: Patient is well appearing and in no distress. Cervical spine mobility is full in all directions:  Skin and Muscle: Right antecubital fossa with prior longitudinal incision well-healed  Range of Motion and Palpation Tests: Mobility is full about the elbows with flexion and extension. Forearm supination and pronation are 85/85 bilaterally.  Wrist flexion/extension is 75/65 bilaterally.  Digital flexion and extension are full.  Thumb opposition is full to the base of the small fingers bilaterally.     Neurologic, Vascular, Motor: Sensation is diminished to light touch in the right median nerve distribution.    Tinel's  testing positive right wrist, right AC fossa APB: 3+/5 Index finger FDP 3/5, FPL 3+/5  Fingers pink and well perfused.  Capillary refill is brisk.     Lab Results  Component Value Date   HGBA1C 5.2 08/06/2023     Imaging: XR Wrist Complete  Right  Result Date: 09/03/2023 X-rays of the right wrist demonstrate scapholunate widening, mild in nature.  There is notable degenerative change at the radiocarpal interval, joint space narrowing is appreciated.  There is also narrowing at the distal radial ulnar joint consistent with degenerative change.   Past Medical/Family/Surgical/Social History: Medications & Allergies reviewed per EMR, new medications updated. Patient Active Problem List   Diagnosis Date Noted   Deep venous thrombosis (HCC) 08/15/2023   Confusion 08/14/2023   PRES (posterior reversible encephalopathy syndrome) 08/07/2023   ESRD on hemodialysis (HCC) 08/07/2023   OSA (obstructive sleep apnea) 08/07/2023   Anemia associated with chronic renal failure 08/07/2023   Arm paresthesia, right 08/07/2023   Hypertensive emergency 08/06/2023   Status post reverse total replacement of left shoulder 09/21/2022   Status post reverse total arthroplasty of right shoulder 06/01/2022   Past Medical History:  Diagnosis Date   Anemia    Anxiety    Arthritis    Hyperparathyroidism (HCC)    Hypertension    Major depressive disorder 08/15/2018   PONV (postoperative nausea and vomiting)    Happened one time. Has had sx since then with no N/V   Renal disorder 12/19/2018   Stage 4 ESRD. HD-MWF   Sleep apnea    does not use CPAP   Family History  Problem Relation Age of Onset   Breast cancer Sister    Breast cancer Cousin        maternal first cousin   Past Surgical History:  Procedure Laterality Date   A/V FISTULAGRAM  2019   ABDOMINAL HYSTERECTOMY  2005   AV FISTULA PLACEMENT Right 07/24/2023   Procedure: RIGHT UPPER EXTREMITY ARTERIOVENOUS GRAFT CREATION;  Surgeon: Chuck Hint, MD;  Location: [facility] OR;  Service: Vascular;  Laterality: Right;   AVGG REMOVAL Right 07/31/2023   Procedure: REMOVAL OF RIGHT ARM ARTERIOVENOUS GORETEX GRAFT (AVGG);  Surgeon: Daria Pastures, MD;  Location: [facility] OR;  Service: Vascular;   Laterality: Right;   BREAST EXCISIONAL BIOPSY Left 2017   scar not visible   CHOLECYSTECTOMY  2002   COLONOSCOPY  2010   COLONOSCOPY WITH PROPOFOL N/A 01/02/2023   Procedure: COLONOSCOPY WITH PROPOFOL;  Surgeon: Jeani Hawking, MD;  Location: [facility];  Service: Gastroenterology;  Laterality: N/A;   IR AV DIALY SHUNT INTRO NEEDLE/INTRAC INITIAL W/PTA/STENT/IMG LT  2021   Stent placed in Left Arm Fistula. Placed in [place]   LIGATION OF ARTERIOVENOUS  FISTULA Left 06/01/2023   Procedure: LIGATION OF LEFT ARM ARTERIOVENOUS  FISTULA WITH HEMATOMA EVACUATION;  Surgeon: Maeola Harman, MD;  Location: [facility] OR;  Service: Vascular;  Laterality: Left;   POLYPECTOMY  01/02/2023   Procedure: POLYPECTOMY;  Surgeon: Jeani Hawking, MD;  Location: [facility];  Service: Gastroenterology;;   REVERSE SHOULDER ARTHROPLASTY Right 06/01/2022   Procedure: REVERSE SHOULDER ARTHROPLASTY;  Surgeon: Bjorn Pippin, MD;  Location: [facility] OR;  Service: Orthopedics;  Laterality: Right;   REVERSE SHOULDER ARTHROPLASTY Left 09/21/2022   Procedure: REVERSE SHOULDER ARTHROPLASTY;  Surgeon: Bjorn Pippin, MD;  Location: [facility] OR;  Service: Orthopedics;  Laterality: Left;   TOTAL KNEE ARTHROPLASTY Right 2008   TUBAL LIGATION     Social History  Occupational History   Not on file  Tobacco Use   Smoking status: Never   Smokeless tobacco: Never  Vaping Use   Vaping status: Never Used  Substance and Sexual Activity   Alcohol use: Not Currently   Drug use: Never   Sexual activity: Not Currently    Birth control/protection: Surgical    Comment: Hysterectomy    Mackynzie Woolford Fara Boros) Denese Killings, M.D. [facility] 9:55 PM

## 2023-09-04 ENCOUNTER — Telehealth: Payer: Self-pay | Admitting: Physical Medicine and Rehabilitation

## 2023-09-04 ENCOUNTER — Other Ambulatory Visit: Payer: Medicare Other | Admitting: *Deleted

## 2023-09-04 NOTE — Telephone Encounter (Signed)
Patient called returning your call. UY#[phone]

## 2023-09-05 ENCOUNTER — Other Ambulatory Visit (HOSPITAL_COMMUNITY): Payer: Self-pay

## 2023-09-05 ENCOUNTER — Other Ambulatory Visit: Payer: Self-pay

## 2023-09-05 ENCOUNTER — Telehealth: Payer: Self-pay | Admitting: Physical Medicine and Rehabilitation

## 2023-09-05 ENCOUNTER — Ambulatory Visit: Payer: Medicare Other | Attending: Vascular Surgery | Admitting: Occupational Therapy

## 2023-09-05 DIAGNOSIS — M6281 Muscle weakness (generalized): Secondary | ICD-10-CM | POA: Insufficient documentation

## 2023-09-05 DIAGNOSIS — R278 Other lack of coordination: Secondary | ICD-10-CM | POA: Diagnosis present

## 2023-09-05 DIAGNOSIS — R208 Other disturbances of skin sensation: Secondary | ICD-10-CM | POA: Insufficient documentation

## 2023-09-05 DIAGNOSIS — M79641 Pain in right hand: Secondary | ICD-10-CM | POA: Insufficient documentation

## 2023-09-05 NOTE — Telephone Encounter (Signed)
Pt is requesting a call in regards to a nerve study.

## 2023-09-05 NOTE — Therapy (Signed)
OUTPATIENT OCCUPATIONAL THERAPY ORTHO EVALUATION  Patient Name: Christina Vasquez MRN: [id number] DOB:Mar 06, 1956, 67 y.o., female Today's Date: 09/05/2023  PCP: Carilyn Goodpasture, NP REFERRING PROVIDER: Daria Pastures, MD  END OF SESSION:  OT End of Session - 09/05/23 1549     Visit Number 1    Number of Visits 13    Date for OT Re-Evaluation 10/19/23    Authorization Type [employer] Medicare    OT Start Time 1450    OT Stop Time 1534    OT Time Calculation (min) 44 min             Past Medical History:  Diagnosis Date   Anemia    Anxiety    Arthritis    Hyperparathyroidism (HCC)    Hypertension    Major depressive disorder 08/15/2018   PONV (postoperative nausea and vomiting)    Happened one time. Has had sx since then with no N/V   Renal disorder 12/19/2018   Stage 4 ESRD. HD-MWF   Sleep apnea    does not use CPAP   Past Surgical History:  Procedure Laterality Date   A/V FISTULAGRAM  2019   ABDOMINAL HYSTERECTOMY  2005   AV FISTULA PLACEMENT Right 07/24/2023   Procedure: RIGHT UPPER EXTREMITY ARTERIOVENOUS GRAFT CREATION;  Surgeon: Chuck Hint, MD;  Location: [facility] OR;  Service: Vascular;  Laterality: Right;   AVGG REMOVAL Right 07/31/2023   Procedure: REMOVAL OF RIGHT ARM ARTERIOVENOUS GORETEX GRAFT (AVGG);  Surgeon: Daria Pastures, MD;  Location: [facility] OR;  Service: Vascular;  Laterality: Right;   BREAST EXCISIONAL BIOPSY Left 2017   scar not visible   CHOLECYSTECTOMY  2002   COLONOSCOPY  2010   COLONOSCOPY WITH PROPOFOL N/A 01/02/2023   Procedure: COLONOSCOPY WITH PROPOFOL;  Surgeon: Jeani Hawking, MD;  Location: [facility];  Service: Gastroenterology;  Laterality: N/A;   IR AV DIALY SHUNT INTRO NEEDLE/INTRAC INITIAL W/PTA/STENT/IMG LT  2021   Stent placed in Left Arm Fistula. Placed in [place]   LIGATION OF ARTERIOVENOUS  FISTULA Left 06/01/2023   Procedure: LIGATION OF LEFT ARM ARTERIOVENOUS  FISTULA WITH HEMATOMA EVACUATION;  Surgeon: Maeola Harman, MD;  Location: [facility] OR;  Service: Vascular;  Laterality: Left;   POLYPECTOMY  01/02/2023   Procedure: POLYPECTOMY;  Surgeon: Jeani Hawking, MD;  Location: [facility];  Service: Gastroenterology;;   REVERSE SHOULDER ARTHROPLASTY Right 06/01/2022   Procedure: REVERSE SHOULDER ARTHROPLASTY;  Surgeon: Bjorn Pippin, MD;  Location: [facility] OR;  Service: Orthopedics;  Laterality: Right;   REVERSE SHOULDER ARTHROPLASTY Left 09/21/2022   Procedure: REVERSE SHOULDER ARTHROPLASTY;  Surgeon: Bjorn Pippin, MD;  Location: [facility] OR;  Service: Orthopedics;  Laterality: Left;   TOTAL KNEE ARTHROPLASTY Right 2008   TUBAL LIGATION     Patient Active Problem List   Diagnosis Date Noted   Deep venous thrombosis (HCC) 08/15/2023   Confusion 08/14/2023   PRES (posterior reversible encephalopathy syndrome) 08/07/2023   ESRD on hemodialysis (HCC) 08/07/2023   OSA (obstructive sleep apnea) 08/07/2023   Anemia associated with chronic renal failure 08/07/2023   Arm paresthesia, right 08/07/2023   Hypertensive emergency 08/06/2023   Status post reverse total replacement of left shoulder 09/21/2022   Status post reverse total arthroplasty of right shoulder 06/01/2022    ONSET DATE: 07/31/23  REFERRING DIAG: right hand weakness and numbness s/p AV graft G56.01 (ICD-10-CM) - Carpal tunnel syndrome, right upper limb  THERAPY DIAG:  Muscle weakness (generalized)  Other disturbances of skin sensation  Other  lack of coordination  Pain in right hand  Rationale for Evaluation and Treatment: Rehabilitation  SUBJECTIVE:   SUBJECTIVE STATEMENT: Pt reports AV fistula in L arm that stopped working, so they placed AV fistula in R arm.  After the numbness wore off she reports continued numbness along radial distrubution.  Pt reports decreased grasp strength and decreased ROM.  Pt reports awaiting schedule for nerve conduction test, to tell them how to proceed. Pt accompanied by: self  PERTINENT HISTORY: PMH  ESRD on HD, HTN, L axillary acute DVT on Eliquis; She had a right upper extremity AVG placed by Dr. 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  The AVG placement of the right arm was complicated by pain numbness and tingling of her hand and fingers which necessitated ligation and excision on 07/31/2023.   PRECAUTIONS: Other: L axillary acute DVT on Eliquis  RED FLAGS: None   WEIGHT BEARING RESTRICTIONS: No  PAIN:  Are you having pain? Yes: NPRS scale: 9.5/10 Pain location: finger tips Pain description: tingling, burning, throbbing Aggravating factors: prolonged elbow flexion  Relieving factors: massage  FALLS: Has patient fallen in last 6 months? No  LIVING ENVIRONMENT: Lives with: lives alone, reports plan to move to a new place with daughter in January Lives in: House/apartment Stairs: Yes: External: 3 steps; on left going up Has following equipment at home: Single point cane and None  PLOF: Independent and Independent with basic ADLs  PATIENT GOALS: to be able to use my R hand again without pain/throbbing  NEXT MD VISIT: 11/23/23 with Dr. Hetty Blend, TBD with Dr. Fara Boros  OBJECTIVE:  Note: Objective measures were completed at Evaluation unless otherwise noted.  HAND DOMINANCE: Right  ADLs: Overall ADLs: Independent - Mod I Transfers/ambulation related to ADLs: Independent, does report utilizing SPC when L knee is bothering her Eating: difficulty with scooping (particularly resistance like ice cream), cutting with knife Tub Shower transfers: currently not taking a shower due to HD port  FUNCTIONAL OUTCOME MEASURES: Quick Dash: 54.54% impairment  UPPER EXTREMITY ROM:     Active ROM Right eval Left eval  Shoulder flexion 120 130  Shoulder abduction    Shoulder adduction    Shoulder extension    Shoulder internal rotation [facility]  Shoulder external rotation [facility] WFL  Elbow flexion WFL WFL  Elbow  extension [facility] WFL  Wrist flexion 75   Wrist extension 65   Wrist ulnar deviation    Wrist radial deviation    Wrist pronation    Wrist supination    (Blank rows = not tested)  HAND FUNCTION: Grip strength: Right: 14 lbs; Left: 45 lbs  COORDINATION: 9 Hole Peg test: Right: 47.81 sec; Left: 25.25 sec  SENSATION: Light touch: impaired along radial distribution with decreased sensation in long and medial side of ring finger, near absent in index finger on R  EDEMA: swelling initially s/p surgery, none at this time  COGNITION: Overall cognitive status: Within functional limits for tasks assessed  OBSERVATIONS: pt with guarding of RUE   TODAY'S TREATMENT:  DATE:  09/05/23 Engaged in hook and full fist with R hand as well as thumb adduction.  Pt reports decreased strength with movements, but able to complete near full ROM with each position.    PATIENT EDUCATION: Education details: Educated on role and purpose of OT as well as potential interventions and goals for therapy based on initial evaluation findings. Person educated: Patient Education method: Explanation and Handouts Education comprehension: verbalized understanding and needs further education  HOME EXERCISE PROGRAM: See pt instructions - carpal tunnel handout from [place] [facility]  GOALS: Goals reviewed with patient? No  SHORT TERM GOALS: Target date: 09/28/23  Pt will be independent with coordination and strengthening HEP. Baseline: Goal status: INITIAL  2.  Pt will verbalize understanding of task modifications and/or potential AE needs to increase ease, safety, and independence w/ ADLs. Baseline:  Goal status: INITIAL  3.  Pt will verbalize understanding of use of modalities and desensitization strategies to decrease pain.  Baseline:  Goal status: INITIAL   LONG TERM GOALS:  Target date: 10/19/23  Pt will demonstrate improved grip strength to 25# or greater to facilitate increased ease and independence with ADLs/IADLs (pulling up pants; cutting foods, scooping ice cream, holding a larger cup,opening food jars/containers). Baseline: R: 14# and L: 45# Goal status: INITIAL  2.  Pt will verbalize understanding of joint protection techniques (avoid repetitive hand/wrist movements, perform tasks with wrist in neutral, and modify activities that cause symptoms). Baseline:  Goal status: INITIAL  3.  Pt will report decreased pain to 5/10 or less overall. Baseline:  Goal status: INITIAL  4.  Pt will report and/or demonstrate improved ability to engage in handwriting tasks for 5 mins without pain increasing > 2/10 on pain scale. Baseline:  Goal status: INITIAL  5.  Pt will improve functional ability by decreased impairment per Quick DASH assessment from 54% to 40% or better, for better quality of life. Baseline: 54.54% Goal status: INITIAL  ASSESSMENT:  CLINICAL IMPRESSION: Patient is a 67 y.o. female who was seen today for occupational therapy evaluation for right hand weakness and numbness s/p AV graft. Pt demonstrating decreased coordination, strength, and sensation in dominant RUE impacting independence with ADLs and IADLs.  Pt currently lives alone with plan for pt and daughter to move in together in the new year.  Pt will benefit from skilled occupational therapy services to address strength and coordination, ROM, pain management, altered sensation, GM/FM control, safety awareness, introduction of compensatory strategies/AE prn, and implementation of an HEP to improve participation and safety during ADLs and IADLs.    PERFORMANCE DEFICITS: in functional skills including ADLs, IADLs, coordination, sensation, ROM, strength, pain, Fine motor control, Gross motor control, decreased knowledge of precautions, decreased knowledge of use of DME, and UE functional use and  psychosocial skills including environmental adaptation.   IMPAIRMENTS: are limiting patient from ADLs, IADLs, and social participation.   COMORBIDITIES: may have co-morbidities  that affects occupational performance. Patient will benefit from skilled OT to address above impairments and improve overall function.  MODIFICATION OR ASSISTANCE TO COMPLETE EVALUATION: No modification of tasks or assist necessary to complete an evaluation.  OT OCCUPATIONAL PROFILE AND HISTORY: Problem focused assessment: Including review of records relating to presenting problem.  CLINICAL DECISION MAKING: LOW - limited treatment options, no task modification necessary  REHAB POTENTIAL: Good  EVALUATION COMPLEXITY: Low      PLAN:  OT FREQUENCY: 2x/week  OT DURATION: 6 weeks  PLANNED INTERVENTIONS: 16109 OT Re-evaluation, 97535 self care/ADL training,  97110 therapeutic exercise, 97530 therapeutic activity, 97112 neuromuscular re-education, 97140 manual therapy, 97010 moist heat, 97010 cryotherapy, compression bandaging, psychosocial skills training, coping strategies training, and patient/family education  RECOMMENDED OTHER SERVICES: NA  CONSULTED AND AGREED WITH PLAN OF CARE: Patient  PLAN FOR NEXT SESSION: initiate tendon gliding, grip and pinch strengthening with resistive clothespins and/or theraputty.  Begin educating on coordination HEP   Rosalio Loud, OTR/L 09/05/2023, 3:50 PM    [facility] at [facility] [address] [place], [address] [place], [place] [address] Phone # [phone] Fax # [phone]

## 2023-09-06 ENCOUNTER — Other Ambulatory Visit: Payer: Self-pay

## 2023-09-06 NOTE — Patient Instructions (Signed)
   Educated on exercise 1 and 5 this session.

## 2023-09-07 ENCOUNTER — Other Ambulatory Visit (HOSPITAL_COMMUNITY): Payer: Self-pay

## 2023-09-09 ENCOUNTER — Other Ambulatory Visit: Payer: Medicare Other

## 2023-09-11 ENCOUNTER — Other Ambulatory Visit: Payer: Self-pay | Admitting: Family Medicine

## 2023-09-11 DIAGNOSIS — Z0000 Encounter for general adult medical examination without abnormal findings: Secondary | ICD-10-CM

## 2023-09-13 ENCOUNTER — Ambulatory Visit: Payer: Medicare Other | Admitting: Occupational Therapy

## 2023-09-13 ENCOUNTER — Ambulatory Visit
Admission: RE | Admit: 2023-09-13 | Discharge: 2023-09-13 | Disposition: A | Discharge status: Home / Self Care | Payer: Medicare Other | Source: Ambulatory Visit | Attending: Family Medicine | Admitting: Family Medicine

## 2023-09-13 ENCOUNTER — Ambulatory Visit: Payer: Medicare Other | Attending: Vascular Surgery | Admitting: Occupational Therapy

## 2023-09-13 ENCOUNTER — Other Ambulatory Visit: Payer: Self-pay

## 2023-09-13 DIAGNOSIS — R278 Other lack of coordination: Secondary | ICD-10-CM | POA: Insufficient documentation

## 2023-09-13 DIAGNOSIS — Z0000 Encounter for general adult medical examination without abnormal findings: Secondary | ICD-10-CM

## 2023-09-13 DIAGNOSIS — M6281 Muscle weakness (generalized): Secondary | ICD-10-CM

## 2023-09-13 DIAGNOSIS — R208 Other disturbances of skin sensation: Secondary | ICD-10-CM | POA: Diagnosis present

## 2023-09-13 DIAGNOSIS — M79641 Pain in right hand: Secondary | ICD-10-CM | POA: Insufficient documentation

## 2023-09-13 DIAGNOSIS — I808 Phlebitis and thrombophlebitis of other sites: Secondary | ICD-10-CM

## 2023-09-13 NOTE — Therapy (Signed)
OUTPATIENT OCCUPATIONAL THERAPY ORTHO  Treatment Note  Patient Name: Christina Vasquez MRN: [id number] DOB:[birth year], 67 y.o., female Today's Date: 09/13/2023  PCP: Carilyn Goodpasture, NP REFERRING PROVIDER: Daria Pastures, MD  END OF SESSION:  OT End of Session - 09/13/23 1410     Visit Number 2    Number of Visits 13    Date for OT Re-Evaluation 10/19/23    Authorization Type [employer] Medicare    OT Start Time 1406    OT Stop Time 1446    OT Time Calculation (min) 40 min              Past Medical History:  Diagnosis Date   Anemia    Anxiety    Arthritis    Hyperparathyroidism (HCC)    Hypertension    Major depressive disorder 08/15/2018   PONV (postoperative nausea and vomiting)    Happened one time. Has had sx since then with no N/V   Renal disorder 12/19/2018   Stage 4 ESRD. HD-MWF   Sleep apnea    does not use CPAP   Past Surgical History:  Procedure Laterality Date   A/V FISTULAGRAM  2019   ABDOMINAL HYSTERECTOMY  2005   AV FISTULA PLACEMENT Right 07/24/2023   Procedure: RIGHT UPPER EXTREMITY ARTERIOVENOUS GRAFT CREATION;  Surgeon: Chuck Hint, MD;  Location: [facility] OR;  Service: Vascular;  Laterality: Right;   AVGG REMOVAL Right 07/31/2023   Procedure: REMOVAL OF RIGHT ARM ARTERIOVENOUS GORETEX GRAFT (AVGG);  Surgeon: Daria Pastures, MD;  Location: [facility] OR;  Service: Vascular;  Laterality: Right;   BREAST EXCISIONAL BIOPSY Left 2017   scar not visible   CHOLECYSTECTOMY  2002   COLONOSCOPY  2010   COLONOSCOPY WITH PROPOFOL N/A 01/02/2023   Procedure: COLONOSCOPY WITH PROPOFOL;  Surgeon: Jeani Hawking, MD;  Location: [facility];  Service: Gastroenterology;  Laterality: N/A;   IR AV DIALY SHUNT INTRO NEEDLE/INTRAC INITIAL W/PTA/STENT/IMG LT  2021   Stent placed in Left Arm Fistula. Placed in [place]   LIGATION OF ARTERIOVENOUS  FISTULA Left 06/01/2023   Procedure: LIGATION OF LEFT ARM ARTERIOVENOUS  FISTULA WITH HEMATOMA EVACUATION;  Surgeon: Maeola Harman, MD;  Location: [facility] OR;  Service: Vascular;  Laterality: Left;   POLYPECTOMY  01/02/2023   Procedure: POLYPECTOMY;  Surgeon: Jeani Hawking, MD;  Location: [facility];  Service: Gastroenterology;;   REVERSE SHOULDER ARTHROPLASTY Right 06/01/2022   Procedure: REVERSE SHOULDER ARTHROPLASTY;  Surgeon: Bjorn Pippin, MD;  Location: [facility] OR;  Service: Orthopedics;  Laterality: Right;   REVERSE SHOULDER ARTHROPLASTY Left 09/21/2022   Procedure: REVERSE SHOULDER ARTHROPLASTY;  Surgeon: Bjorn Pippin, MD;  Location: [facility] OR;  Service: Orthopedics;  Laterality: Left;   TOTAL KNEE ARTHROPLASTY Right 2008   TUBAL LIGATION     Patient Active Problem List   Diagnosis Date Noted   Deep venous thrombosis (HCC) 08/15/2023   Confusion 08/14/2023   PRES (posterior reversible encephalopathy syndrome) 08/07/2023   ESRD on hemodialysis (HCC) 08/07/2023   OSA (obstructive sleep apnea) 08/07/2023   Anemia associated with chronic renal failure 08/07/2023   Arm paresthesia, right 08/07/2023   Hypertensive emergency 08/06/2023   Status post reverse total replacement of left shoulder 09/21/2022   Status post reverse total arthroplasty of right shoulder 06/01/2022    ONSET DATE: 07/31/23  REFERRING DIAG: right hand weakness and numbness s/p AV graft G56.01 (ICD-10-CM) - Carpal tunnel syndrome, right upper limb  THERAPY DIAG:  Muscle weakness (generalized)  Other disturbances of skin  sensation  Other lack of coordination  Pain in right hand  Rationale for Evaluation and Treatment: Rehabilitation  SUBJECTIVE:   SUBJECTIVE STATEMENT: Pt apologetic of missing 11 oclock session.  Pt reports that her hands feel "fullness" in index, long, and ring finger and feels that sometimes she will be playing a game on her phone and her hands with jerk.   Pt accompanied by: self  PERTINENT HISTORY: PMH ESRD on HD, HTN, L axillary acute DVT on Eliquis; She had a right upper extremity AVG placed by Dr.  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  The AVG placement of the right arm was complicated by pain numbness and tingling of her hand and fingers which necessitated ligation and excision on 07/31/2023.   PRECAUTIONS: Other: L axillary acute DVT on Eliquis  RED FLAGS: None   WEIGHT BEARING RESTRICTIONS: No  PAIN:  Are you having pain? Yes: NPRS scale: 2-3/10 Pain location: finger tips Pain description: tingling, throbbing Aggravating factors: prolonged elbow flexion  Relieving factors: massage  FALLS: Has patient fallen in last 6 months? No  LIVING ENVIRONMENT: Lives with: lives alone, reports plan to move to a new place with daughter in January Lives in: House/apartment Stairs: Yes: External: 3 steps; on left going up Has following equipment at home: Single point cane and None  PLOF: Independent and Independent with basic ADLs  PATIENT GOALS: to be able to use my R hand again without pain/throbbing  NEXT MD VISIT: 11/23/23 with Dr. Hetty Blend, TBD with Dr. Fara Boros  OBJECTIVE:  Note: Objective measures were completed at Evaluation unless otherwise noted.  HAND DOMINANCE: Right  ADLs: Overall ADLs: Independent - Mod I Transfers/ambulation related to ADLs: Independent, does report utilizing SPC when L knee is bothering her Eating: difficulty with scooping (particularly resistance like ice cream), cutting with knife Tub Shower transfers: currently not taking a shower due to HD port  FUNCTIONAL OUTCOME MEASURES: Quick Dash: 54.54% impairment  UPPER EXTREMITY ROM:     Active ROM Right eval Left eval  Shoulder flexion 120 130  Shoulder abduction    Shoulder adduction    Shoulder extension    Shoulder internal rotation [facility]  Shoulder external rotation [facility] WFL  Elbow flexion WFL WFL  Elbow extension [facility] WFL  Wrist flexion 75   Wrist extension 65   Wrist ulnar deviation    Wrist radial deviation     Wrist pronation    Wrist supination    (Blank rows = not tested)  HAND FUNCTION: Grip strength: Right: 14 lbs; Left: 45 lbs  COORDINATION: 9 Hole Peg test: Right: 47.81 sec; Left: 25.25 sec  SENSATION: Light touch: impaired along radial distribution with decreased sensation in long and medial side of ring finger, near absent in index finger on R  EDEMA: swelling initially s/p surgery, none at this time  COGNITION: Overall cognitive status: Within functional limits for tasks assessed  OBSERVATIONS: pt with guarding of RUE   TODAY'S TREATMENT:  DATE:  09/13/23 ROM: OT instructed pt in tendon gliding with pt demonstrating difficulty with "duck mouth" initially.  However when OT demonstrating table top and then transitioning to duck mouth, pt with improved ability to complete.  Pt completing remainder of movements with increased ease.  Completed thumb opposition with good mobility, with exception of decreased ROM when reaching towards tip of small finger.  Transitioned to thumb flexion to base on small finger, with improvements noted. Theraputty: engaged in grip and pinch strengthening with yellow theraputty. OT providing min cues to attend to alignment of wrist/forearm during movements to decrease over extension when attempting to complete full grasp.  Pt completing tip to tip and 3 point pinch with decreased flexion and strength from index finger.  Pt demonstrating improvements with pinch and pull and pinch and twist movements as able to incorporate increased UB movements.    09/05/23 Engaged in hook and full fist with R hand as well as thumb adduction.  Pt reports decreased strength with movements, but able to complete near full ROM with each position.    PATIENT EDUCATION: Education details: ROM and strengthening with and without putty Person educated:  Patient Education method: Explanation and Handouts Education comprehension: verbalized understanding and needs further education  HOME EXERCISE PROGRAM: See pt instructions - carpal tunnel handout from [place] [facility]  Access Code: [email or web address] URL: [email or web address] Date: 09/13/2023 Prepared by: [facility] - Outpatient  Rehab - [facility]  Exercises - Hand AROM Tendon Gliding Series  - 3-4 x daily - 10 reps - Thumb Radial Adduction with Thumb Flexion AROM on Table  - 3-4 x daily - 10 reps - Thumb AROM Opposition  - 3-4 x daily - 10 reps - Rolling Putty on Table  - 2 x daily - 10 reps - Tip Pinch with Putty  - 2 x daily - 10 reps - 3-Point Pinch with Putty  - 2 x daily - 10 reps - Key Pinch with Putty  - 2 x daily - 10 reps - Finger Pinch and Pull with Putty  - 2 x daily - 10 reps - Putty Squeezes  - 2 x daily - 10 reps - Finger Lumbricals with Putty  - 2 x daily - 10 reps  GOALS: Goals reviewed with patient? No  SHORT TERM GOALS: Target date: 09/28/23  Pt will be independent with coordination and strengthening HEP. Baseline: Goal status: IN PROGRESS  2.  Pt will verbalize understanding of task modifications and/or potential AE needs to increase ease, safety, and independence w/ ADLs. Baseline:  Goal status: IN PROGRESS  3.  Pt will verbalize understanding of use of modalities and desensitization strategies to decrease pain.  Baseline:  Goal status: IN PROGRESS   LONG TERM GOALS: Target date: 10/19/23  Pt will demonstrate improved grip strength to 25# or greater to facilitate increased ease and independence with ADLs/IADLs (pulling up pants; cutting foods, scooping ice cream, holding a larger cup,opening food jars/containers). Baseline: R: 14# and L: 45# Goal status: IN PROGRESS  2.  Pt will verbalize understanding of joint protection techniques (avoid repetitive hand/wrist movements, perform tasks with wrist in neutral, and modify activities  that cause symptoms). Baseline:  Goal status: IN PROGRESS  3.  Pt will report decreased pain to 5/10 or less overall. Baseline:  Goal status: IN PROGRESS  4.  Pt will report and/or demonstrate improved ability to engage in handwriting tasks for 5 mins without pain increasing > 2/10 on pain scale. Baseline:  Goal status:  IN PROGRESS  5.  Pt will improve functional ability by decreased impairment per Quick DASH assessment from 54% to 40% or better, for better quality of life. Baseline: 54.54% Goal status: IN PROGRESS  ASSESSMENT:  CLINICAL IMPRESSION: Pt demonstrating good tolerance to ROM with tendon gliding and thumb opposition and flexion exercises.  Pt still with reports of "fullness" and "throbbing" in fingers along radial nerve innervation, but able to tolerate ROM this session.  Pt demonstrating good tolerance to yellow theraputty with grip and various pinches, least control and engagement with isolated index finger movements.    PERFORMANCE DEFICITS: in functional skills including ADLs, IADLs, coordination, sensation, ROM, strength, pain, Fine motor control, Gross motor control, decreased knowledge of precautions, decreased knowledge of use of DME, and UE functional use and psychosocial skills including environmental adaptation.       PLAN:  OT FREQUENCY: 2x/week  OT DURATION: 6 weeks  PLANNED INTERVENTIONS: 97168 OT Re-evaluation, 97535 self care/ADL training, 19147 therapeutic exercise, 97530 therapeutic activity, 97112 neuromuscular re-education, 97140 manual therapy, 97010 moist heat, 97010 cryotherapy, compression bandaging, psychosocial skills training, coping strategies training, and patient/family education  RECOMMENDED OTHER SERVICES: NA  CONSULTED AND AGREED WITH PLAN OF CARE: Patient  PLAN FOR NEXT SESSION: Radial and/or median nerve glides,  grip and pinch strengthening with resistive clothespins, review putty as needed.  Begin educating on coordination  HEP   Rosalio Loud, OTR/L 09/13/2023, 2:11 PM    [facility] Rehab at [facility] [address] [place], [address] [place], [place] [address] Phone # [phone] Fax # [phone]

## 2023-09-15 NOTE — Procedures (Signed)
   HISTORY: 67 year old female with end-stage renal disease, history of posterior reversible encephalopathy, abnormal MRI scans  TECHNIQUE:  This is a routine 16 channel EEG recording with one channel devoted to a limited EKG recording.  It was performed during wakefulness, drowsiness and asleep.  Hyperventilation and photic stimulation were performed as activating procedures.  There are minimum muscle and movement artifact noted.  Upon maximum arousal, posterior dominant waking rhythm consistent of rhythmic alpha range activity. Activities are symmetric over the bilateral posterior derivations and attenuated with eye opening.  Photic stimulation did not alter the tracing.  Hyperventilation produced mild/moderate buildup with higher amplitude and the slower activities noted.  During EEG recording, patient developed drowsiness and no deeper stage of sleep was achieved During EEG recording, there was no epileptiform discharge noted.  EKG demonstrate normal sinus rhythm.  CONCLUSION: This is a  normal awake EEG.  There is no electrodiagnostic evidence of epileptiform discharge.  Levert Feinstein, M.D. Ph.D.  [facility] [address] [place], [place] [address] Phone: [phone] Fax:      [phone]

## 2023-09-18 ENCOUNTER — Ambulatory Visit: Payer: Medicare Other | Admitting: Occupational Therapy

## 2023-09-18 ENCOUNTER — Ambulatory Visit: Payer: Medicare Other | Admitting: Physical Medicine and Rehabilitation

## 2023-09-18 ENCOUNTER — Encounter: Payer: Self-pay | Admitting: Physical Medicine and Rehabilitation

## 2023-09-18 DIAGNOSIS — R531 Weakness: Secondary | ICD-10-CM | POA: Diagnosis not present

## 2023-09-18 DIAGNOSIS — R202 Paresthesia of skin: Secondary | ICD-10-CM | POA: Diagnosis not present

## 2023-09-18 DIAGNOSIS — K5730 Diverticulosis of large intestine without perforation or abscess without bleeding: Secondary | ICD-10-CM | POA: Insufficient documentation

## 2023-09-18 DIAGNOSIS — R208 Other disturbances of skin sensation: Secondary | ICD-10-CM

## 2023-09-18 DIAGNOSIS — M6281 Muscle weakness (generalized): Secondary | ICD-10-CM

## 2023-09-18 DIAGNOSIS — M79601 Pain in right arm: Secondary | ICD-10-CM

## 2023-09-18 DIAGNOSIS — R278 Other lack of coordination: Secondary | ICD-10-CM

## 2023-09-18 DIAGNOSIS — M79641 Pain in right hand: Secondary | ICD-10-CM

## 2023-09-18 NOTE — Therapy (Signed)
OUTPATIENT OCCUPATIONAL THERAPY ORTHO  Treatment Note  Patient Name: Christina Vasquez MRN: [id number] DOB:Dec 11, 1955, 67 y.o., female Today's Date: 09/18/2023  PCP: Carilyn Goodpasture, NP REFERRING PROVIDER: Daria Pastures, MD  END OF SESSION:  OT End of Session - 09/18/23 1318     Visit Number 3    Number of Visits 13    Date for OT Re-Evaluation 10/19/23    Authorization Type [employer] Medicare    OT Start Time 1318    OT Stop Time 1400    OT Time Calculation (min) 42 min               Past Medical History:  Diagnosis Date   Anemia    Anxiety    Arthritis    Hyperparathyroidism (HCC)    Hypertension    Major depressive disorder 08/15/2018   PONV (postoperative nausea and vomiting)    Happened one time. Has had sx since then with no N/V   Renal disorder 12/19/2018   Stage 4 ESRD. HD-MWF   Sleep apnea    does not use CPAP   Past Surgical History:  Procedure Laterality Date   A/V FISTULAGRAM  2019   ABDOMINAL HYSTERECTOMY  2005   AV FISTULA PLACEMENT Right 07/24/2023   Procedure: RIGHT UPPER EXTREMITY ARTERIOVENOUS GRAFT CREATION;  Surgeon: Chuck Hint, MD;  Location: [facility] OR;  Service: Vascular;  Laterality: Right;   AVGG REMOVAL Right 07/31/2023   Procedure: REMOVAL OF RIGHT ARM ARTERIOVENOUS GORETEX GRAFT (AVGG);  Surgeon: Daria Pastures, MD;  Location: [facility] OR;  Service: Vascular;  Laterality: Right;   BREAST EXCISIONAL BIOPSY Left 2017   scar not visible   CHOLECYSTECTOMY  2002   COLONOSCOPY  2010   COLONOSCOPY WITH PROPOFOL N/A 01/02/2023   Procedure: COLONOSCOPY WITH PROPOFOL;  Surgeon: Jeani Hawking, MD;  Location: [facility] ENDOSCOPY;  Service: Gastroenterology;  Laterality: N/A;   IR AV DIALY SHUNT INTRO NEEDLE/INTRAC INITIAL W/PTA/STENT/IMG LT  2021   Stent placed in Left Arm Fistula. Placed in [place]   LIGATION OF ARTERIOVENOUS  FISTULA Left 06/01/2023   Procedure: LIGATION OF LEFT ARM ARTERIOVENOUS  FISTULA WITH HEMATOMA EVACUATION;  Surgeon: Maeola Harman, MD;  Location: [facility] OR;  Service: Vascular;  Laterality: Left;   POLYPECTOMY  01/02/2023   Procedure: POLYPECTOMY;  Surgeon: Jeani Hawking, MD;  Location: [facility];  Service: Gastroenterology;;   REVERSE SHOULDER ARTHROPLASTY Right 06/01/2022   Procedure: REVERSE SHOULDER ARTHROPLASTY;  Surgeon: Bjorn Pippin, MD;  Location: [facility] OR;  Service: Orthopedics;  Laterality: Right;   REVERSE SHOULDER ARTHROPLASTY Left 09/21/2022   Procedure: REVERSE SHOULDER ARTHROPLASTY;  Surgeon: Bjorn Pippin, MD;  Location: [facility] OR;  Service: Orthopedics;  Laterality: Left;   TOTAL KNEE ARTHROPLASTY Right 2008   TUBAL LIGATION     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   Deep venous thrombosis (HCC) 08/15/2023   Confusion 08/14/2023   PRES (posterior reversible encephalopathy syndrome) 08/07/2023   ESRD on hemodialysis (HCC) 08/07/2023   OSA (obstructive sleep apnea) 08/07/2023   Anemia associated with chronic renal failure 08/07/2023   Arm paresthesia, right 08/07/2023   Hypertensive emergency 08/06/2023   Status post reverse total replacement of left shoulder 09/21/2022   Status post reverse total arthroplasty of right shoulder 06/01/2022   Peripheral vascular disease (HCC) 06/20/2021   Iron deficiency anemia, unspecified 12/26/2018   Secondary hyperparathyroidism (HCC) 07/11/2017    ONSET DATE: 07/31/23  REFERRING DIAG: right  hand weakness and numbness s/p AV graft G56.01 (ICD-10-CM) - Carpal tunnel syndrome, right upper limb  THERAPY DIAG:  Muscle weakness (generalized)  Other disturbances of skin sensation  Other lack of coordination  Pain in right hand  Rationale for Evaluation and Treatment: Rehabilitation  SUBJECTIVE:   SUBJECTIVE STATEMENT: Pt reports this are going okay, but reports that she is itchy and scratching and notices hives where she is scratching.  Pt guesses that it may be medicine related.  Had  nerve conduction study today, where MD reports "definite nerve damage" per pt. Pt accompanied by: self  PERTINENT HISTORY: PMH ESRD on HD, HTN, L axillary acute DVT on Eliquis; She had a right upper extremity AVG placed by Dr. 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  The AVG placement of the right arm was complicated by pain numbness and tingling of her hand and fingers which necessitated ligation and excision on 07/31/2023.   PRECAUTIONS: Other: L axillary acute DVT on Eliquis  RED FLAGS: None   WEIGHT BEARING RESTRICTIONS: No  PAIN:  Are you having pain? No  FALLS: Has patient fallen in last 6 months? No  LIVING ENVIRONMENT: Lives with: lives alone, reports plan to move to a new place with daughter in January Lives in: House/apartment Stairs: Yes: External: 3 steps; on left going up Has following equipment at home: Single point cane and None  PLOF: Independent and Independent with basic ADLs  PATIENT GOALS: to be able to use my R hand again without pain/throbbing  NEXT MD VISIT: 11/23/23 with Dr. Hetty Blend, TBD with Dr. Fara Boros  OBJECTIVE:  Note: Objective measures were completed at Evaluation unless otherwise noted.  HAND DOMINANCE: Right  ADLs: Overall ADLs: Independent - Mod I Transfers/ambulation related to ADLs: Independent, does report utilizing SPC when L knee is bothering her Eating: difficulty with scooping (particularly resistance like ice cream), cutting with knife Tub Shower transfers: currently not taking a shower due to HD port  FUNCTIONAL OUTCOME MEASURES: Quick Dash: 54.54% impairment  UPPER EXTREMITY ROM:     Active ROM Right eval Left eval  Shoulder flexion 120 130  Shoulder abduction    Shoulder adduction    Shoulder extension    Shoulder internal rotation [facility]  Shoulder external rotation [facility] WFL  Elbow flexion WFL WFL  Elbow extension [facility] WFL  Wrist flexion 75   Wrist  extension 65   Wrist ulnar deviation    Wrist radial deviation    Wrist pronation    Wrist supination    (Blank rows = not tested)  HAND FUNCTION: Grip strength: Right: 14 lbs; Left: 45 lbs  COORDINATION: 9 Hole Peg test: Right: 47.81 sec; Left: 25.25 sec  SENSATION: Light touch: impaired along radial distribution with decreased sensation in long and medial side of ring finger, near absent in index finger on R  EDEMA: swelling initially s/p surgery, none at this time  COGNITION: Overall cognitive status: Within functional limits for tasks assessed  OBSERVATIONS: pt with guarding of RUE   TODAY'S TREATMENT:  DATE:  09/18/23 Resistance Clothespins 2, 4, 6# with RUE for mid functional reaching and sustained pinch. Pt completing 2# resistance without any issues, therefore increased to 4#.  Pt reports increased strain with 4# resistance clothespins.  Incorporating increased functional reach with placing 2,4,6# clothespins on vertical dowel with pt having increased difficulty with 6# clothespin, therefore returned to decreased resistance and then back to complete 6# resistance with pt able to complete.  Theraputty: engaged in pinch and pull, pinch and twist, and removal of 10 stones from yellow theraputty with increased focus on pinch and manipulation as needed to open various food containers.  Completed BUE pinch and pull to pull off 10 small pieces and then roll into small ball utilizing only finger tips on R hand.   09/13/23 ROM: OT instructed pt in tendon gliding with pt demonstrating difficulty with "duck mouth" initially.  However when OT demonstrating table top and then transitioning to duck mouth, pt with improved ability to complete.  Pt completing remainder of movements with increased ease.  Completed thumb opposition with good mobility, with exception of decreased ROM when reaching towards tip of small finger.   Transitioned to thumb flexion to base on small finger, with improvements noted. Theraputty: engaged in grip and pinch strengthening with yellow theraputty. OT providing min cues to attend to alignment of wrist/forearm during movements to decrease over extension when attempting to complete full grasp.  Pt completing tip to tip and 3 point pinch with decreased flexion and strength from index finger.  Pt demonstrating improvements with pinch and pull and pinch and twist movements as able to incorporate increased UB movements.    09/05/23 Engaged in hook and full fist with R hand as well as thumb adduction.  Pt reports decreased strength with movements, but able to complete near full ROM with each position.    PATIENT EDUCATION: Education details: ROM and strengthening with and without putty Person educated: Patient Education method: Explanation and Handouts Education comprehension: verbalized understanding and needs further education  HOME EXERCISE PROGRAM: See pt instructions - carpal tunnel handout from [place] [facility]  Access Code: [email or web address] URL: [email or web address] Date: 09/18/2023 Prepared by: [facility] - Outpatient  Rehab - [facility]  Exercises - Hand AROM Tendon Gliding Series  - 3-4 x daily - 10 reps - Thumb Radial Adduction with Thumb Flexion AROM on Table  - 3-4 x daily - 10 reps - Thumb AROM Opposition  - 3-4 x daily - 10 reps - Rolling Putty on Table  - 2 x daily - 10 reps - Tip Pinch with Putty  - 2 x daily - 10 reps - 3-Point Pinch with Putty  - 2 x daily - 10 reps - Key Pinch with Putty  - 2 x daily - 10 reps - Finger Pinch and Pull with Putty  - 2 x daily - 10 reps - Putty Squeezes  - 2 x daily - 10 reps - Finger Lumbricals with Putty  - 2 x daily - 10 reps - Removing Marbles from Putty  - 2 x daily - 10 reps - Tip Pinch with Putty  - 2 x daily - 10 reps  GOALS: Goals reviewed with patient? No  SHORT TERM GOALS: Target date:  09/28/23  Pt will be independent with coordination and strengthening HEP. Baseline: Goal status: IN PROGRESS  2.  Pt will verbalize understanding of task modifications and/or potential AE needs to increase ease, safety, and independence w/ ADLs. Baseline:  Goal status: IN PROGRESS  3.  Pt will verbalize understanding of use of modalities and desensitization strategies to decrease pain.  Baseline:  Goal status: IN PROGRESS   LONG TERM GOALS: Target date: 10/19/23  Pt will demonstrate improved grip strength to 25# or greater to facilitate increased ease and independence with ADLs/IADLs (pulling up pants; cutting foods, scooping ice cream, holding a larger cup,opening food jars/containers). Baseline: R: 14# and L: 45# Goal status: IN PROGRESS  2.  Pt will verbalize understanding of joint protection techniques (avoid repetitive hand/wrist movements, perform tasks with wrist in neutral, and modify activities that cause symptoms). Baseline:  Goal status: IN PROGRESS  3.  Pt will report decreased pain to 5/10 or less overall. Baseline:  Goal status: IN PROGRESS  4.  Pt will report and/or demonstrate improved ability to engage in handwriting tasks for 5 mins without pain increasing > 2/10 on pain scale. Baseline:  Goal status: IN PROGRESS  5.  Pt will improve functional ability by decreased impairment per Quick DASH assessment from 54% to 40% or better, for better quality of life. Baseline: 54.54% Goal status: IN PROGRESS  ASSESSMENT:  CLINICAL IMPRESSION: Pt demonstrating good tolerance to resistance clothespins with ability to increase from 2 to 6# resistance.  Pt reports difficulty with 6#, but able to complete.  Pt demonstrating good tolerance to yellow theraputty with grip and various pinches, even demonstrating improved coordination with removing stones from putty and pulling off small pieces and rotating in finger tips to make small balls.  Pt with decreased reports of  "fullness" or pain as previous session.   PERFORMANCE DEFICITS: in functional skills including ADLs, IADLs, coordination, sensation, ROM, strength, pain, Fine motor control, Gross motor control, decreased knowledge of precautions, decreased knowledge of use of DME, and UE functional use and psychosocial skills including environmental adaptation.       PLAN:  OT FREQUENCY: 2x/week  OT DURATION: 6 weeks  PLANNED INTERVENTIONS: 97168 OT Re-evaluation, 97535 self care/ADL training, 45409 therapeutic exercise, 97530 therapeutic activity, 97112 neuromuscular re-education, 97140 manual therapy, 97010 moist heat, 97010 cryotherapy, compression bandaging, psychosocial skills training, coping strategies training, and patient/family education  RECOMMENDED OTHER SERVICES: NA  CONSULTED AND AGREED WITH PLAN OF CARE: Patient  PLAN FOR NEXT SESSION: Radial and/or median nerve glides,  grip strengthening with hand gripper and/or flex bar, review putty as needed.  Begin educating on coordination HEP   Rosalio Loud, OTR/L 09/18/2023, 1:19 PM    [facility] at [facility] [address] [place], [address] [place], [place] [address] Phone # [phone] Fax # [phone]

## 2023-09-18 NOTE — Progress Notes (Signed)
Functional Pain Scale - descriptive words and definitions  Mild (2)   Noticeable when not distracted/no impact on ADL's/sleep only slightly affected and able to   use both passive and active distraction for comfort. Mild range order  Average Pain 2  RUE NCS, she states she has numbness, tingling and slight pain in right arm. She has difficultly grasping and pinching due to numbness. She is in PT. Numbness and tingling in thumb and other fingers.

## 2023-09-18 NOTE — Progress Notes (Signed)
Christina Vasquez - 67 y.o. female MRN [id number]  Date of birth: 08-22-1956  Office Visit Note: Visit Date: 09/18/2023 PCP: Carilyn Goodpasture, NP Referred by: Samuella Cota, MD  Subjective: Chief Complaint  Patient presents with   Right Arm - Numbness, Tingling, Pain   HPI: Christina Vasquez is a 67 y.o. female who comes in today at the request of Dr. Bonner Puna for evaluation and management of chronic, worsening and severe pain, numbness and tingling in the Right upper extremities.  Patient is Right hand dominant.  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  She underwent right upper arm AV graft on July 24, 2023 by Dr. Waverly Ferrari, and he reports there was no good option for fistula, therefore the choice was made to place a upper arm graft, between the high brachial vein to artery.  She reports immediate symptom onset after waking up from anesthesia.  She noticed numbness tingling pain to the right lateral forearm, weakness of the right first 3 fingers, not able to hold a pencil and fork, arterial Doppler of right upper extremity demonstrate triphasic waveform throughout the brachial radial and ulnar arteries, was diagnosed with steal syndrome.  It was so painful, she had a second admission on July 31, 2023, then the graft was removed.  During that second hospitalization she had some confusion and ultimately saw Levert Feinstein, MD at [facility].  Those notes were reviewed and she had suggested EMG of the upper limb.  Subsequently she saw Lawerance Sabal, MD for the same complaints.  She has had progressive weakness in the hand with dropping objects.  No left-sided complaints.  No symptoms down the arms consistent with radiculopathy.  She has a history of bilateral total  shoulder arthroplasties by Dr. Everardo Pacific at [facility].   I spent more than 30 minutes speaking face-to-face with the patient with 50% of the time in counseling and discussing coordination of care.       Review of Systems  Musculoskeletal:  Positive for joint pain.  Neurological:  Positive for tingling and focal weakness.  All other systems reviewed and are negative.  Otherwise per HPI.  Assessment & Plan: Visit Diagnoses:    ICD-10-CM   1. Paresthesia of skin  R20.2 NCV with EMG (electromyography)    2. Right arm pain  M79.601     3. Weakness  R53.1        Plan: Impression: Clearly appears to be a median nerve injury more consistent near the cubital fossa and where the fistula was repaired and inserted but cannot rule out a concomitant median neuropathy at the wrist.  Electrodiagnostic study performed today.  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 The lesion is characterized by sensory and motor with evidence of axonal injury.   There is no significant electrodiagnostic evidence of any other focal nerve entrapment, brachial plexopathy or cervical radiculopathy.   Recommendations: 1.  Follow-up with referring physician. 2.  Continue current management of symptoms. 3.  Suggest surgical evaluation.  Meds & Orders: No orders of the defined types were placed in this encounter.   Orders Placed This Encounter  Procedures   NCV with EMG (electromyography)  Follow-up: Return for Anshul Argarwala, MD.   Procedures: No procedures performed  EMG & NCV Findings: Evaluation of the right median motor nerve showed prolonged distal onset latency (4.5 ms), reduced amplitude (2.0 mV), and decreased conduction velocity (Elbow-Wrist, 35 m/s).  The right median (across palm) sensory nerve showed prolonged distal  peak latency (Wrist, 19.8 ms), reduced amplitude (0.3 V), and prolonged distal peak latency (Palm, 15.0 ms).  The right ulnar sensory nerve showed prolonged distal peak latency (4.0 ms) and decreased conduction velocity (Wrist-5th Digit, 35 m/s).  All remaining nerves (as indicated in the following tables) were within normal limits.    Needle evaluation of the right abductor pollicis brevis muscle showed increased insertional activity, widespread spontaneous activity, and diminished recruitment.  The right pronator teres muscle showed increased insertional activity, increased spontaneous activity, and diminished recruitment.  All remaining muscles (as indicated in the following table) showed no evidence of electrical instability.    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 The lesion is characterized by sensory and motor with evidence of axonal injury.   There is no significant electrodiagnostic evidence of any other focal nerve entrapment, brachial plexopathy or cervical radiculopathy.   Recommendations: 1.  Follow-up with referring physician. 2.  Continue current management of symptoms. 3.  Suggest surgical evaluation.  ___________________________ Naaman Plummer FAAPMR Board Certified, American Board of Physical Medicine and Rehabilitation    Nerve Conduction Studies Anti Sensory Summary Table   Stim Site NR Peak (ms) Norm Peak (ms) P-T Amp (V) Norm P-T Amp Site1 Site2 Delta-P (ms) Dist (cm) Vel (m/s) Norm Vel (m/s)  Right Median Acr Palm Anti Sensory (2nd Digit)  32C  Wrist    *19.8 <3.6 *0.3 >10 Wrist Palm 4.8 0.0    Palm    *15.0 <2.0 5.0         Site 3    14.5  4.5         Right Radial Anti Sensory (Base 1st Digit)  31.3C  Wrist    2.3 <3.1 16.4  Wrist Base 1st Digit 2.3 0.0    Right Ulnar Anti Sensory  (5th Digit)  31.7C  Wrist    *4.0 <3.7 21.8 >15.0 Wrist 5th Digit 4.0 14.0 *35 >38   Motor Summary Table   Stim Site NR Onset (ms) Norm Onset (ms) O-P Amp (mV) Norm O-P Amp Site1 Site2 Delta-0 (ms) Dist (cm) Vel (m/s) Norm Vel (m/s)  Right Median Motor (Abd Poll Brev)  31.1C  Wrist    *4.5 <4.2 *2.0 >5 Elbow Wrist 6.2 21.5 *35 >50  Elbow    10.7  1.1         Right Ulnar Motor (Abd Dig Min)  31C  Wrist    3.4 <4.2 8.8 >3 B Elbow Wrist 3.6 20.0 56 >53  B Elbow    7.0  5.4  A Elbow B Elbow 1.5 10.0 67 >53  A Elbow    8.5  6.4          EMG   Side Muscle Nerve Root Ins Act Fibs Psw Amp Dur Poly Recrt Int Dennie Bible Comment  Right Abd Poll Brev Median C8-T1 *Incr *4+ *4+ Nml Nml 0 *Reduced Nml   Right 1stDorInt Ulnar C8-T1 Nml Nml Nml Nml Nml 0 Nml Nml   Right PronatorTeres Median C6-7 *Incr *3+ *3+ Nml Nml 0 *Reduced  Nml   Right Biceps Musculocut C5-6 Nml Nml Nml Nml Nml 0 Nml Nml   Right Deltoid Axillary C5-6 Nml Nml Nml Nml Nml 0 Nml Nml     Nerve Conduction Studies Anti Sensory Left/Right Comparison   Stim Site L Lat (ms) R Lat (ms) L-R Lat (ms) L Amp (V) R Amp (V) L-R Amp (%) Site1 Site2 L Vel (m/s) R Vel (m/s) L-R Vel (m/s)  Median Acr Palm Anti Sensory (2nd Digit)  32C  Wrist  *19.8   *0.3  Wrist Palm     Palm  *15.0   5.0        Site 3  14.5   4.5        Radial Anti Sensory (Base 1st Digit)  31.3C  Wrist  2.3   16.4  Wrist Base 1st Digit     Ulnar Anti Sensory (5th Digit)  31.7C  Wrist  *4.0   21.8  Wrist 5th Digit  *35    Motor Left/Right Comparison   Stim Site L Lat (ms) R Lat (ms) L-R Lat (ms) L Amp (mV) R Amp (mV) L-R Amp (%) Site1 Site2 L Vel (m/s) R Vel (m/s) L-R Vel (m/s)  Median Motor (Abd Poll Brev)  31.1C  Wrist  *4.5   *2.0  Elbow Wrist  *35   Elbow  10.7   1.1        Ulnar Motor (Abd Dig Min)  31C  Wrist  3.4   8.8  B Elbow Wrist  56   B Elbow  7.0   5.4  A Elbow B Elbow  67   A Elbow  8.5   6.4           Waveforms:             Clinical  History: No specialty comments available.   She reports that she has never smoked. She has never used smokeless tobacco.  Recent Labs    08/06/23 1115  HGBA1C 5.2    Objective:  VS:  HT:    WT:   BMI:     BP:   HR: bpm  TEMP: ( )  RESP:  Physical Exam Vitals and nursing note reviewed.  Constitutional:      General: She is not in acute distress.    Appearance: Normal appearance. She is well-developed. She is not ill-appearing.  HENT:     Head: Normocephalic and atraumatic.  Eyes:     Conjunctiva/sclera: Conjunctivae normal.     Pupils: Pupils are equal, round, and reactive to light.  Cardiovascular:     Rate and Rhythm: Normal rate.     Pulses: Normal pulses.  Pulmonary:     Effort: Pulmonary effort is normal.  Musculoskeletal:        General: No swelling, tenderness or deformity.     Right lower leg: No edema.     Left lower leg: No edema.     Comments: Inspection reveals well-healed scar in the right cubital fossa as well as mild flattening of the right APB but no atrophy of the bilateral FDI or hand intrinsics. There is no swelling, color changes, allodynia or dystrophic changes. There is 5 out of 5 strength in the bilateral wrist extension, finger abduction but weakness of the radial digits long finger flexion. There is decreased sensation to light touch in the right median nerve distribution. There is a negative Froment's test bilaterally. There is a negative Tinel's test at the bilateral wrist and elbow.  There is a negative Phalen's test bilaterally. There is a negative Hoffmann's test bilaterally.  Skin:    General: Skin is warm and dry.     Findings: No erythema or rash.  Neurological:     General: No focal deficit present.     Mental Status: She is alert and oriented to person, place, and time.     Cranial Nerves: No cranial nerve deficit.     Sensory: Sensory deficit present.     Motor: Weakness present. No abnormal muscle tone.     Coordination: Coordination  normal.     Gait: Gait normal.  Psychiatric:        Mood and Affect: Mood normal.        Behavior: Behavior normal.     Ortho Exam  Imaging: No results found.  Past Medical/Family/Surgical/Social History: Medications & Allergies reviewed per EMR, new medications updated. 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   Deep venous thrombosis (HCC) 08/15/2023   Confusion 08/14/2023   PRES (posterior reversible encephalopathy syndrome) 08/07/2023   ESRD on hemodialysis (HCC) 08/07/2023   OSA (obstructive sleep apnea) 08/07/2023   Anemia associated with chronic renal failure 08/07/2023   Arm paresthesia, right 08/07/2023   Hypertensive emergency 08/06/2023   Status post reverse total replacement of left shoulder 09/21/2022   Status post reverse total arthroplasty of right shoulder 06/01/2022   Peripheral vascular disease (HCC) 06/20/2021   Iron deficiency anemia, unspecified 12/26/2018   Secondary hyperparathyroidism (HCC) 07/11/2017   Past Medical History:  Diagnosis Date   Anemia    Anxiety    Arthritis    Hyperparathyroidism (HCC)    Hypertension    Major depressive disorder 08/15/2018   PONV (postoperative nausea and vomiting)    Happened one time. Has had sx since then with no N/V   Renal disorder 12/19/2018   Stage 4 ESRD. HD-MWF   Sleep apnea    does not use CPAP   Family History  Problem Relation Age of Onset   Breast cancer Sister    Breast cancer Cousin        maternal first cousin   Past Surgical History:  Procedure Laterality Date   A/V FISTULAGRAM  2019   ABDOMINAL HYSTERECTOMY  2005   AV FISTULA PLACEMENT Right 07/24/2023   Procedure: RIGHT UPPER EXTREMITY ARTERIOVENOUS GRAFT CREATION;  Surgeon: Chuck Hint, MD;  Location: [facility] OR;  Service: Vascular;  Laterality: Right;   AVGG REMOVAL Right 07/31/2023   Procedure: REMOVAL OF RIGHT ARM ARTERIOVENOUS GORETEX GRAFT (AVGG);  Surgeon: Daria Pastures, MD;  Location: [facility] OR;  Service: Vascular;  Laterality: Right;   BREAST EXCISIONAL BIOPSY Left 2017   scar not visible   CHOLECYSTECTOMY  2002   COLONOSCOPY  2010   COLONOSCOPY WITH PROPOFOL N/A 01/02/2023   Procedure: COLONOSCOPY WITH PROPOFOL;  Surgeon: Jeani Hawking, MD;  Location: [facility] END[facility]COPY;  Service: Gastroenterology;  Laterality: N/A;   IR AV DIALY SHUNT INTRO NEEDLE/INTRAC INITIAL W/PTA/STENT/IMG LT  2021   Stent placed in Left Arm Fistula. Placed in [place]   LIGATION OF ARTERIOVENOUS  FISTULA Left 06/01/2023   Procedure: LIGATION OF LEFT ARM ARTERIOVENOUS  FISTULA WITH HEMATOMA EVACUATION;  Surgeon: Maeola Harman, MD;  Location: [facility] OR;  Service: Vascular;  Laterality: Left;   POLYPECTOMY  01/02/2023   Procedure: POLYPECTOMY;  Surgeon: Jeani Hawking, MD;  Location: [facility]COPY;  Service: Gastroenterology;;   REVERSE SHOULDER ARTHROPLASTY Right 06/01/2022  Procedure: REVERSE SHOULDER ARTHROPLASTY;  Surgeon: Bjorn Pippin, MD;  Location: [facility] OR;  Service: Orthopedics;  Laterality: Right;   REVERSE SHOULDER ARTHROPLASTY Left 09/21/2022   Procedure: REVERSE SHOULDER ARTHROPLASTY;  Surgeon: Bjorn Pippin, MD;  Location: [facility] OR;  Service: Orthopedics;  Laterality: Left;   TOTAL KNEE ARTHROPLASTY Right 2008   TUBAL LIGATION     Social History   Occupational History   Not on file  Tobacco Use   Smoking status: Never   Smokeless tobacco: Never  Vaping Use   Vaping status: Never Used  Substance and Sexual Activity   Alcohol use: Not Currently   Drug use: Never   Sexual activity: Not Currently    Birth control/protection: Surgical    Comment: Hysterectomy

## 2023-09-18 NOTE — Procedures (Signed)
EMG & NCV Findings: Evaluation of the right median motor nerve showed prolonged distal onset latency (4.5 ms), reduced amplitude (2.0 mV), and decreased conduction velocity (Elbow-Wrist, 35 m/s).  The right median (across palm) sensory nerve showed prolonged distal peak latency (Wrist, 19.8 ms), reduced amplitude (0.3 V), and prolonged distal peak latency (Palm, 15.0 ms).  The right ulnar sensory nerve showed prolonged distal peak latency (4.0 ms) and decreased conduction velocity (Wrist-5th Digit, 35 m/s).  All remaining nerves (as indicated in the following tables) were within normal limits.    Needle evaluation of the right abductor pollicis brevis muscle showed increased insertional activity, widespread spontaneous activity, and diminished recruitment.  The right pronator teres muscle showed increased insertional activity, increased spontaneous activity, and diminished recruitment.  All remaining muscles (as indicated in the following table) showed no evidence of electrical instability.    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 The lesion is characterized by sensory and motor with evidence of axonal injury.   There is no significant electrodiagnostic evidence of any other focal nerve entrapment, brachial plexopathy or cervical radiculopathy.   Recommendations: 1.  Follow-up with referring physician. 2.  Continue current management of symptoms. 3.  Suggest surgical evaluation.  ___________________________ Naaman Plummer FAAPMR Board Certified, American Board of Physical Medicine and Rehabilitation    Nerve Conduction Studies Anti Sensory Summary Table   Stim Site NR Peak (ms) Norm Peak (ms) P-T Amp (V) Norm P-T Amp Site1 Site2 Delta-P (ms) Dist (cm) Vel (m/s) Norm Vel (m/s)  Right Median Acr Palm Anti Sensory (2nd  Digit)  32C  Wrist    *19.8 <3.6 *0.3 >10 Wrist Palm 4.8 0.0    Palm    *15.0 <2.0 5.0         Site 3    14.5  4.5         Right Radial Anti Sensory (Base 1st Digit)  31.3C  Wrist    2.3 <3.1 16.4  Wrist Base 1st Digit 2.3 0.0    Right Ulnar Anti Sensory (5th Digit)  31.7C  Wrist    *4.0 <3.7 21.8 >15.0 Wrist 5th Digit 4.0 14.0 *35 >38   Motor Summary Table   Stim Site NR Onset (ms) Norm Onset (ms) O-P Amp (mV) Norm O-P Amp Site1 Site2 Delta-0 (ms) Dist (cm) Vel (m/s) Norm Vel (m/s)  Right Median Motor (Abd Poll Brev)  31.1C  Wrist    *4.5 <4.2 *2.0 >5 Elbow Wrist 6.2 21.5 *35 >50  Elbow    10.7  1.1         Right Ulnar Motor (Abd Dig Min)  31C  Wrist    3.4 <4.2 8.8 >3 B Elbow Wrist 3.6 20.0 56 >53  B Elbow    7.0  5.4  A Elbow B Elbow 1.5 10.0 67 >53  A Elbow    8.5  6.4          EMG   Side Muscle Nerve Root Ins Act Fibs Psw Amp Dur Poly Recrt Int Dennie Bible Comment  Right Abd Poll Brev Median C8-T1 *Incr *4+ *4+ Nml Nml 0 *Reduced Nml   Right 1stDorInt Ulnar C8-T1 Nml Nml Nml Nml Nml 0 Nml Nml   Right PronatorTeres Median C6-7 *Incr *3+ *3+ Nml Nml 0 *Reduced Nml   Right Biceps Musculocut C5-6 Nml Nml Nml Nml Nml 0 Nml  Nml   Right Deltoid Axillary C5-6 Nml Nml Nml Nml Nml 0 Nml Nml     Nerve Conduction Studies Anti Sensory Left/Right Comparison   Stim Site L Lat (ms) R Lat (ms) L-R Lat (ms) L Amp (V) R Amp (V) L-R Amp (%) Site1 Site2 L Vel (m/s) R Vel (m/s) L-R Vel (m/s)  Median Acr Palm Anti Sensory (2nd Digit)  32C  Wrist  *19.8   *0.3  Wrist Palm     Palm  *15.0   5.0        Site 3  14.5   4.5        Radial Anti Sensory (Base 1st Digit)  31.3C  Wrist  2.3   16.4  Wrist Base 1st Digit     Ulnar Anti Sensory (5th Digit)  31.7C  Wrist  *4.0   21.8  Wrist 5th Digit  *35    Motor Left/Right Comparison   Stim Site L Lat (ms) R Lat (ms) L-R Lat (ms) L Amp (mV) R Amp (mV) L-R Amp (%) Site1 Site2 L Vel (m/s) R Vel (m/s) L-R Vel (m/s)  Median Motor (Abd Poll Brev)  31.1C   Wrist  *4.5   *2.0  Elbow Wrist  *35   Elbow  10.7   1.1        Ulnar Motor (Abd Dig Min)  31C  Wrist  3.4   8.8  B Elbow Wrist  56   B Elbow  7.0   5.4  A Elbow B Elbow  67   A Elbow  8.5   6.4           Waveforms:

## 2023-09-19 ENCOUNTER — Other Ambulatory Visit: Payer: Medicare Other | Admitting: *Deleted

## 2023-09-24 ENCOUNTER — Ambulatory Visit: Payer: Medicare Other | Admitting: Orthopedic Surgery

## 2023-09-24 ENCOUNTER — Encounter (INDEPENDENT_AMBULATORY_CARE_PROVIDER_SITE_OTHER): Payer: Medicare Other | Admitting: Neurology

## 2023-09-24 ENCOUNTER — Telehealth: Payer: Self-pay | Admitting: Neurology

## 2023-09-24 DIAGNOSIS — R200 Anesthesia of skin: Secondary | ICD-10-CM | POA: Diagnosis not present

## 2023-09-24 DIAGNOSIS — R410 Disorientation, unspecified: Secondary | ICD-10-CM

## 2023-09-24 NOTE — Progress Notes (Signed)
Christina Vasquez - 67 y.o. female MRN [id number]  Date of birth: Sep 07, 1956  Office Visit Note: Visit Date: 09/24/2023 PCP: Carilyn Goodpasture, NP Referred by: Carilyn Goodpasture, NP  Subjective: No chief complaint on file.  HPI: Christina Vasquez is a pleasant 67 y.o. female who presents today for follow up of right hand numbness and tingling in the radial sided digits with associated weakness.  She underwent AV graft surgery with subsequent removal in Sept of this year.  She states that after the initial AV graft placement at the right upper arm, she started to experience numbness and tingling in the radial sided digits with associated weakness.  She was subsequently seen by the vascular team and underwent AVG excision.  Today, she presents for follow-up after undergoing recent EMG testing of the right upper extremity.  Her symptoms of numbness in the radial sided digits with associated weakness have persisted, however she has noted a subtle improvement in her numbness as well as improvement with her range of motion and strength of the right hand with ongoing therapy.  Pertinent ROS were reviewed with the patient and found to be negative unless otherwise specified above in HPI.    Assessment & Plan: Visit Diagnoses:  No diagnosis found.   Plan: Extensive discussion was had the patient today regarding her right upper extremity complaints.  She continues to demonstrate median nerve symptoms throughout the right hand with numbness in the median nerve distribution without discernible 2-point discrimination and associated weakness to the APB and AIN musculature.  There remains concern for potential median nerve injury versus compressive hematoma or potential neuropraxia in the setting of her recent upper extremity AV graft placement.   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  I discussed this in detail with Dr. Alvester Morin today who performed the EMG study.  I also discussed with Dr. Shon Baton who performs ultrasound, if this would be potentially evaluated appropriately with ultrasound to traced the median nerve more proximally and look for any significant lesion or compression proximal to the antecubital fossa.  He stated this could be done after her next follow-up should her symptoms persist which I am in agreement with.  Patient would like to continue with therapy given her mild but progressive improvement which I am in agreement with.  Particularly, if this is neuropraxic in nature, she is well within the period of time that she should continue to see ongoing potential improvement in nerve function.  We discussed the potential of surgical exploration at the site of the AV graft placement and potential decompression of the median nerve versus neurolysis in this region, there does not appear to be any significant discontinuity of the median nerve based on her nerve study.  From a timing standpoint, she will return to me in January of next year, as she has some social obligations that would preclude any significant operation before that time.  This will allow for ongoing therapy in order to gain maximal medical improvement with conservative treatment prior to any aggressive surgical intervention.  Greater than 45 minutes was spent in the care of this patient as well as discussion with other fellow physicians regarding care moving forward.   Follow-up: No follow-ups on file.   Meds & Orders: No orders of the defined types were placed in  this encounter.   No orders of the defined types were placed in this encounter.    Procedures: No procedures performed      Clinical History: No specialty comments available.  She reports that she has never smoked. She has never used smokeless tobacco.  Recent Labs     08/06/23 1115  HGBA1C 5.2    Objective:   Vital Signs: There were no vitals taken for this visit.  Physical Exam  Gen: Well-appearing, in no acute distress; non-toxic CV: Regular Rate. Well-perfused. Warm.  Resp: Breathing unlabored on room air; no wheezing. Psych: Fluid speech in conversation; appropriate affect; normal thought process  Ortho Exam PHYSICAL EXAM:  General: Patient is well appearing and in no distress. Cervical spine mobility is full in all directions:  Skin and Muscle: Right antecubital fossa with prior longitudinal incision well-healed  Range of Motion and Palpation Tests: Mobility is full about the elbows with flexion and extension. Forearm supination and pronation are 85/85 bilaterally.  Wrist flexion/extension is 75/65 bilaterally.  Digital flexion and extension are full.  Thumb opposition is full to the base of the small fingers bilaterally.    Has maintained strength with wrist pronation with both elbow flexed and extended, able to perform pronation against resistance without significant pain   Neurologic, Vascular, Motor: Sensation is diminished to light touch in the right median nerve distribution.    Tinel's testing positive right wrist, right AC fossa APB: 3+/5 Index finger FDP 3/5, FPL 3+/5  Fingers pink and well perfused.  Capillary refill is brisk.     Lab Results  Component Value Date   HGBA1C 5.2 08/06/2023     Imaging: No results found.  Past Medical/Family/Surgical/Social History: Medications & Allergies reviewed per EMR, new medications updated. 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   Deep venous thrombosis (HCC) 08/15/2023   Confusion 08/14/2023   PRES (posterior reversible encephalopathy syndrome) 08/07/2023   ESRD on hemodialysis (HCC) 08/07/2023   OSA (obstructive sleep apnea) 08/07/2023   Anemia associated with chronic renal failure 08/07/2023   Arm  paresthesia, right 08/07/2023   Hypertensive emergency 08/06/2023   Status post reverse total replacement of left shoulder 09/21/2022   Status post reverse total arthroplasty of right shoulder 06/01/2022   Peripheral vascular disease (HCC) 06/20/2021   Iron deficiency anemia, unspecified 12/26/2018   Secondary hyperparathyroidism (HCC) 07/11/2017   Past Medical History:  Diagnosis Date   Anemia    Anxiety    Arthritis    Hyperparathyroidism (HCC)    Hypertension    Major depressive disorder 08/15/2018   PONV (postoperative nausea and vomiting)    Happened one time. Has had sx since then with no N/V   Renal disorder 12/19/2018   Stage 4 ESRD. HD-MWF   Sleep apnea    does not use CPAP   Family History  Problem Relation Age of Onset   Breast cancer Sister    Breast cancer Cousin        maternal first cousin   Past Surgical History:  Procedure Laterality Date   A/V FISTULAGRAM  2019   ABDOMINAL HYSTERECTOMY  2005   AV FISTULA PLACEMENT Right 07/24/2023   Procedure: RIGHT UPPER EXTREMITY ARTERIOVENOUS GRAFT CREATION;  Surgeon: Chuck Hint, MD;  Location: [facility] OR;  Service: Vascular;  Laterality: Right;   AVGG REMOVAL Right 07/31/2023   Procedure: REMOVAL OF RIGHT ARM ARTERIOVENOUS GORETEX GRAFT (AVGG);  Surgeon: Daria Pastures, MD;  Location: [facility] OR;  Service: Vascular;  Laterality: Right;   BREAST EXCISIONAL BIOPSY Left 2017   scar not visible   CHOLECYSTECTOMY  2002   COLONOSCOPY  2010   COLONOSCOPY WITH PROPOFOL N/A 01/02/2023   Procedure: COLONOSCOPY WITH PROPOFOL;  Surgeon: Jeani Hawking, MD;  Location: [facility] ENDOSCOPY;  Service: Gastroenterology;  Laterality: N/A;   IR AV DIALY SHUNT INTRO NEEDLE/INTRAC INITIAL W/PTA/STENT/IMG LT  2021   Stent placed in Left Arm Fistula. Placed in [place]   LIGATION OF ARTERIOVENOUS  FISTULA Left 06/01/2023   Procedure: LIGATION OF LEFT ARM ARTERIOVENOUS  FISTULA WITH HEMATOMA EVACUATION;  Surgeon: Maeola Harman, MD;   Location[facility] OR;  Service: Vascular;  Laterality: Left;   POLYPECTOMY  01/02/2023   Procedure: POLYPECTOMY;  Surgeon: Jeani Hawking, MD;  Location: [facility];  Service: Gastroenterology;;   REVERSE SHOULDER ARTHROPLASTY Right 06/01/2022   Procedure: REVERSE SHOULDER ARTHROPLASTY;  Surgeon: Bjorn Pippin, MD;  Location: [facility] OR;  Service: Orthopedics;  Laterality: Right;   REVERSE SHOULDER ARTHROPLASTY Left 09/21/2022   Procedure: REVERSE SHOULDER ARTHROPLASTY;  Surgeon: Bjorn Pippin, MD;  Location: [facility] OR;  Service: Orthopedics;  Laterality: Left;   TOTAL KNEE ARTHROPLASTY Right 2008   TUBAL LIGATION     Social History   Occupational History   Not on file  Tobacco Use   Smoking status: Never   Smokeless tobacco: Never  Vaping Use   Vaping status: Never Used  Substance and Sexual Activity   Alcohol use: Not Currently   Drug use: Never   Sexual activity: Not Currently    Birth control/protection: Surgical    Comment: Hysterectomy    Jina Olenick Fara Boros) Denese Killings, M.D. [facility] 7:55 PM

## 2023-09-24 NOTE — Telephone Encounter (Signed)
Pt cancelled Nerve Conduction and EMG due to Dr. Alvester Morin already done on 09/18/23.

## 2023-09-26 ENCOUNTER — Encounter: Payer: Self-pay | Admitting: Physical Medicine and Rehabilitation

## 2023-09-27 ENCOUNTER — Ambulatory Visit: Payer: Medicare Other | Admitting: Occupational Therapy

## 2023-09-27 DIAGNOSIS — R278 Other lack of coordination: Secondary | ICD-10-CM

## 2023-09-27 DIAGNOSIS — M6281 Muscle weakness (generalized): Secondary | ICD-10-CM

## 2023-09-27 DIAGNOSIS — R208 Other disturbances of skin sensation: Secondary | ICD-10-CM

## 2023-09-27 DIAGNOSIS — M79641 Pain in right hand: Secondary | ICD-10-CM

## 2023-09-27 NOTE — Telephone Encounter (Signed)
I talked with patient, MRI of the brain in October showed significant improvement, resolution of previous T2/FLAIR hyperintensity signal in the cerebral hemispheres and cerebellum, but there is evidence of mild small vessel disease  Her reported slow mentation unlikely related to her multiple chronic medical additions including end-stage renal disease, required hemodialysis, obstructive sleep apnea, noncompliant with her CPAP machine  Emphasized the importance of healthy lifestyle, compliance with the medications, also her CPAP machine, keep up with her dialysis schedule  Please see the MyChart message reply(ies) for my assessment and plan.    This patient gave consent for this Medical Advice Message and is aware that it may result in a bill to [employer], as well as the possibility of receiving a bill for a co-payment or deductible. They are an established patient, but are not seeking medical advice exclusively about a problem treated during an in person or video visit in the last seven days. I did not recommend an in person or video visit within seven days of my reply.    I spent a total of 7 minutes cumulative time within 7 days through [employer].  Levert Feinstein, MD

## 2023-09-27 NOTE — Patient Instructions (Signed)
In-Hand Manipulation Skills Rotation:  Hold pen, try to "twirl" like a baton, keeping flat with surface of table. Try going BOTH directions 10x For increased challenge, can complete with coin.  Flip:  Hold pen in writing position, flip in an arch to "erase" position, then back to "write" position. Do not lift hand off table.  10x   Translation:  Open hand palm up, put an object in your palm and then use your fingers and thumb to move it to the tips of your fingers, pinched against your thumb.  10x (Bigger is easier (fat marker), smaller is harder (penny))  Shift:  Hold pen like a dart, start "shifting" it forward until you are holding it at the base, then shift it backwards until you are holding it at the tip again. 10x (like putting a key in a key hole)  

## 2023-09-27 NOTE — Therapy (Signed)
OUTPATIENT OCCUPATIONAL THERAPY ORTHO  Treatment Note  Patient Name: Christina Vasquez MRN: [id number] DOB:1956-04-24, 67 y.o., female Today's Date: 09/27/2023  PCP: Carilyn Goodpasture, NP REFERRING PROVIDER: Daria Pastures, MD  END OF SESSION:  OT End of Session - 09/27/23 0803     Visit Number 4    Number of Visits 13    Date for OT Re-Evaluation 10/19/23    Authorization Type [employer] Medicare    OT Start Time 0800    OT Stop Time 0842    OT Time Calculation (min) 42 min                Past Medical History:  Diagnosis Date   Anemia    Anxiety    Arthritis    Hyperparathyroidism (HCC)    Hypertension    Major depressive disorder 08/15/2018   PONV (postoperative nausea and vomiting)    Happened one time. Has had sx since then with no N/V   Renal disorder 12/19/2018   Stage 4 ESRD. HD-MWF   Sleep apnea    does not use CPAP   Past Surgical History:  Procedure Laterality Date   A/V FISTULAGRAM  2019   ABDOMINAL HYSTERECTOMY  2005   AV FISTULA PLACEMENT Right 07/24/2023   Procedure: RIGHT UPPER EXTREMITY ARTERIOVENOUS GRAFT CREATION;  Surgeon: Chuck Hint, MD;  Location[facility] OR;  Service: Vascular;  Laterality: Right;   AVGG REMOVAL Right 07/31/2023   Procedure: REMOVAL OF RIGHT ARM ARTERIOVENOUS GORETEX GRAFT (AVGG);  Surgeon: Daria Pastures, MD;  Location: [facility] OR;  Service: Vascular;  Laterality: Right;   BREAST EXCISIONAL BIOPSY Left 2017   scar not visible   CHOLECYSTECTOMY  2002   COLONOSCOPY  2010   COLONOSCOPY WITH PROPOFOL N/A 01/02/2023   Procedure: COLONOSCOPY WITH PROPOFOL;  Surgeon: Jeani Hawking, MD;  Location: [facility] ENDOSCOPY;  Service: Gastroenterology;  Laterality: N/A;   IR AV DIALY SHUNT INTRO NEEDLE/INTRAC INITIAL W/PTA/STENT/IMG LT  2021   Stent placed in Left Arm Fistula. Placed in [place]   LIGATION OF ARTERIOVENOUS  FISTULA Left 06/01/2023   Procedure: LIGATION OF LEFT ARM ARTERIOVENOUS  FISTULA WITH HEMATOMA EVACUATION;  Surgeon: Maeola Harman, MD;  Location: [facility] OR;  Service: Vascular;  Laterality: Left;   POLYPECTOMY  01/02/2023   Procedure: POLYPECTOMY;  Surgeon: Jeani Hawking, MD;  Location: [facility];  Service: Gastroenterology;;   REVERSE SHOULDER ARTHROPLASTY Right 06/01/2022   Procedure: REVERSE SHOULDER ARTHROPLASTY;  Surgeon: Bjorn Pippin, MD;  Location: [facility] OR;  Service: Orthopedics;  Laterality: Right;   REVERSE SHOULDER ARTHROPLASTY Left 09/21/2022   Procedure: REVERSE SHOULDER ARTHROPLASTY;  Surgeon: Bjorn Pippin, MD;  Location: [facility] OR;  Service: Orthopedics;  Laterality: Left;   TOTAL KNEE ARTHROPLASTY Right 2008   TUBAL LIGATION     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   Deep venous thrombosis (HCC) 08/15/2023   Confusion 08/14/2023   PRES (posterior reversible encephalopathy syndrome) 08/07/2023   ESRD on hemodialysis (HCC) 08/07/2023   OSA (obstructive sleep apnea) 08/07/2023   Anemia associated with chronic renal failure 08/07/2023   Arm paresthesia, right 08/07/2023   Hypertensive emergency 08/06/2023   Status post reverse total replacement of left shoulder 09/21/2022   Status post reverse total arthroplasty of right shoulder 06/01/2022   Peripheral vascular disease (HCC) 06/20/2021   Iron deficiency anemia, unspecified 12/26/2018   Secondary hyperparathyroidism (HCC) 07/11/2017    ONSET DATE: 07/31/23  REFERRING DIAG:  right hand weakness and numbness s/p AV graft G56.01 (ICD-10-CM) - Carpal tunnel syndrome, right upper limb  THERAPY DIAG:  Pain in right hand  Other lack of coordination  Other disturbances of skin sensation  Muscle weakness (generalized)  Rationale for Evaluation and Treatment: Rehabilitation  SUBJECTIVE:   SUBJECTIVE STATEMENT: Pt reports hand still tingles, reporting "pins and needles" in thumb, index, and long finger.  Pt reports seeing hand doctor and they discussed the possibility of doing  carpal tunnel release and "clean out" at damaged nerves to increase function.   Pt accompanied by: self  PERTINENT HISTORY: PMH ESRD on HD, HTN, L axillary acute DVT on Eliquis; She had a right upper extremity AVG placed by Dr. 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  The AVG placement of the right arm was complicated by pain numbness and tingling of her hand and fingers which necessitated ligation and excision on 07/31/2023.   PRECAUTIONS: Other: L axillary acute DVT on Eliquis  RED FLAGS: None   WEIGHT BEARING RESTRICTIONS: No  PAIN:  Are you having pain? No  FALLS: Has patient fallen in last 6 months? No  LIVING ENVIRONMENT: Lives with: lives alone, reports plan to move to a new place with daughter in January Lives in: House/apartment Stairs: Yes: External: 3 steps; on left going up Has following equipment at home: Single point cane and None  PLOF: Independent and Independent with basic ADLs  PATIENT GOALS: to be able to use my R hand again without pain/throbbing  NEXT MD VISIT: 11/23/23 with Dr. Hetty Blend, TBD with Dr. Fara Boros  OBJECTIVE:  Note: Objective measures were completed at Evaluation unless otherwise noted.  HAND DOMINANCE: Right  ADLs: Overall ADLs: Independent - Mod I Transfers/ambulation related to ADLs: Independent, does report utilizing SPC when L knee is bothering her Eating: difficulty with scooping (particularly resistance like ice cream), cutting with knife Tub Shower transfers: currently not taking a shower due to HD port  FUNCTIONAL OUTCOME MEASURES: Quick Dash: 54.54% impairment  UPPER EXTREMITY ROM:     Active ROM Right eval Left eval  Shoulder flexion 120 130  Shoulder abduction    Shoulder adduction    Shoulder extension    Shoulder internal rotation [facility]  Shoulder external rotation [facility] WFL  Elbow flexion WFL WFL  Elbow extension [facility] WFL  Wrist flexion 75   Wrist  extension 65   Wrist ulnar deviation    Wrist radial deviation    Wrist pronation    Wrist supination    (Blank rows = not tested)  HAND FUNCTION: Grip strength: Right: 14 lbs; Left: 45 lbs  COORDINATION: 9 Hole Peg test: Right: 47.81 sec; Left: 25.25 sec  SENSATION: Light touch: impaired along radial distribution with decreased sensation in long and medial side of ring finger, near absent in index finger on R  EDEMA: swelling initially s/p surgery, none at this time  COGNITION: Overall cognitive status: Within functional limits for tasks assessed  OBSERVATIONS: pt with guarding of RUE   TODAY'S TREATMENT:  DATE:  09/27/23 Coordination: engaged in large grip peg pattern replication, incorporating rotation in finger tips prior to placing into resistive peg board.  Pt demonstrating good motor control when rotating peg in between thumb, index, and long finger.  OT challenging pt to rotate clockwise and counterclockwise.   Pen tricks: engaged in rotation, flip, translation, and shift with pen. OT providing demonstration and adding drawings to instructions to facilitate increased carryover of technique. Pt demonstrating difficulty translation, benefiting from breaking task down and demonstration with coin.  Pt with improved ability to complete with pen over coin. OT encouraged pt to complete above with pen at this time, but to continue to attempt coin as she feels appropriate.  Coordination: w/ RUE including: picking up various small objects/coins and placing them in containers, picking up various small objects/coins and trying to see how many pt is able to hold in-hand without drops, picking up coins and stacking/unstacking them.  Pt reports needing to rely on vision to compensate for impaired sensation.    09/18/23 Resistance Clothespins 2, 4, 6# with RUE for mid functional reaching and sustained pinch. Pt completing 2#  resistance without any issues, therefore increased to 4#.  Pt reports increased strain with 4# resistance clothespins.  Incorporating increased functional reach with placing 2,4,6# clothespins on vertical dowel with pt having increased difficulty with 6# clothespin, therefore returned to decreased resistance and then back to complete 6# resistance with pt able to complete.  Theraputty: engaged in pinch and pull, pinch and twist, and removal of 10 stones from yellow theraputty with increased focus on pinch and manipulation as needed to open various food containers.  Completed BUE pinch and pull to pull off 10 small pieces and then roll into small ball utilizing only finger tips on R hand.   09/13/23 ROM: OT instructed pt in tendon gliding with pt demonstrating difficulty with "duck mouth" initially.  However when OT demonstrating table top and then transitioning to duck mouth, pt with improved ability to complete.  Pt completing remainder of movements with increased ease.  Completed thumb opposition with good mobility, with exception of decreased ROM when reaching towards tip of small finger.  Transitioned to thumb flexion to base on small finger, with improvements noted. Theraputty: engaged in grip and pinch strengthening with yellow theraputty. OT providing min cues to attend to alignment of wrist/forearm during movements to decrease over extension when attempting to complete full grasp.  Pt completing tip to tip and 3 point pinch with decreased flexion and strength from index finger.  Pt demonstrating improvements with pinch and pull and pinch and twist movements as able to incorporate increased UB movements.    PATIENT EDUCATION: Education details: ROM and strengthening with and without putty Person educated: Patient Education method: Explanation and Handouts Education comprehension: verbalized understanding and needs further education  HOME EXERCISE PROGRAM: See pt instructions - carpal tunnel  handout from [place] [facility]  Access Code: [email or web address] URL: [email or web address] Date: 09/18/2023 Prepared by: [facility] - Outpatient  Rehab - [facility]  Exercises - Hand AROM Tendon Gliding Series  - 3-4 x daily - 10 reps - Thumb Radial Adduction with Thumb Flexion AROM on Table  - 3-4 x daily - 10 reps - Thumb AROM Opposition  - 3-4 x daily - 10 reps - Rolling Putty on Table  - 2 x daily - 10 reps - Tip Pinch with Putty  - 2 x daily - 10 reps - 3-Point Pinch with Putty  - 2 x  daily - 10 reps - Key Pinch with Putty  - 2 x daily - 10 reps - Finger Pinch and Pull with Putty  - 2 x daily - 10 reps - Putty Squeezes  - 2 x daily - 10 reps - Finger Lumbricals with Putty  - 2 x daily - 10 reps - Removing Marbles from Putty  - 2 x daily - 10 reps - Tip Pinch with Putty  - 2 x daily - 10 reps  GOALS: Goals reviewed with patient? No  SHORT TERM GOALS: Target date: 09/28/23  Pt will be independent with coordination and strengthening HEP. Baseline: Goal status: IN PROGRESS - Pt reports completing the putty exercises "sometimes" 09/27/23  2.  Pt will verbalize understanding of task modifications and/or potential AE needs to increase ease, safety, and independence w/ ADLs. Baseline:  Goal status: MET - 09/27/23  3.  Pt will verbalize understanding of use of modalities and desensitization strategies to decrease pain.  Baseline:  Goal status: MET - pt reports use of hand warmers and/or heater when hands are extra cold, takes tylenol scheduled to keep pain at bay - 09/27/23   LONG TERM GOALS: Target date: 10/19/23  Pt will demonstrate improved grip strength to 25# or greater to facilitate increased ease and independence with ADLs/IADLs (pulling up pants; cutting foods, scooping ice cream, holding a larger cup,opening food jars/containers). Baseline: R: 14# and L: 45# Goal status: IN PROGRESS  2.  Pt will verbalize understanding of joint protection techniques  (avoid repetitive hand/wrist movements, perform tasks with wrist in neutral, and modify activities that cause symptoms). Baseline:  Goal status: IN PROGRESS  3.  Pt will report decreased pain to 5/10 or less overall. Baseline:  Goal status: IN PROGRESS  4.  Pt will report and/or demonstrate improved ability to engage in handwriting tasks for 5 mins without pain increasing > 2/10 on pain scale. Baseline:  Goal status: IN PROGRESS  5.  Pt will improve functional ability by decreased impairment per Quick DASH assessment from 54% to 40% or better, for better quality of life. Baseline: 54.54% Goal status: IN PROGRESS  ASSESSMENT:  CLINICAL IMPRESSION: Pt demonstrating good tolerance to coordination tasks, demonstrating mild difficulty with translation but good motor control with rotation, flip, shift, and stacking of items.  Pt benefiting from use of larger items compared to smaller/flatter items.   PERFORMANCE DEFICITS: in functional skills including ADLs, IADLs, coordination, sensation, ROM, strength, pain, Fine motor control, Gross motor control, decreased knowledge of precautions, decreased knowledge of use of DME, and UE functional use and psychosocial skills including environmental adaptation.       PLAN:  OT FREQUENCY: 2x/week  OT DURATION: 6 weeks  PLANNED INTERVENTIONS: 97168 OT Re-evaluation, 97535 self care/ADL training, 63016 therapeutic exercise, 97530 therapeutic activity, 97112 neuromuscular re-education, 97140 manual therapy, 97010 moist heat, 97010 cryotherapy, compression bandaging, psychosocial skills training, coping strategies training, and patient/family education  RECOMMENDED OTHER SERVICES: NA  CONSULTED AND AGREED WITH PLAN OF CARE: Patient  PLAN FOR NEXT SESSION: Radial and/or median nerve glides,  grip strengthening with hand gripper and/or flex bar, review putty as needed.  Review and add to coordination HEP PRN   Rosalio Loud, OTR/L 09/27/2023, 8:03  AM    [facility] at [facility] [address] [place], [address] [place], [place] [address] Phone # [phone] Fax # [phone]

## 2023-10-01 ENCOUNTER — Ambulatory Visit: Payer: Medicare Other | Admitting: Occupational Therapy

## 2023-10-01 DIAGNOSIS — M79641 Pain in right hand: Secondary | ICD-10-CM

## 2023-10-01 DIAGNOSIS — R278 Other lack of coordination: Secondary | ICD-10-CM

## 2023-10-01 DIAGNOSIS — M6281 Muscle weakness (generalized): Secondary | ICD-10-CM

## 2023-10-01 DIAGNOSIS — R208 Other disturbances of skin sensation: Secondary | ICD-10-CM

## 2023-10-01 NOTE — Therapy (Signed)
OUTPATIENT OCCUPATIONAL THERAPY ORTHO  Treatment Note  Patient Name: Christina Vasquez MRN: [id number] DOB:21-Jul-1956, 67 y.o., female Today's Date: 10/01/2023  PCP: Carilyn Goodpasture, NP REFERRING PROVIDER: Daria Pastures, MD  END OF SESSION:  OT End of Session - 10/01/23 1406     Visit Number 5    Number of Visits 13    Date for OT Re-Evaluation 10/19/23    Authorization Type [employer] Medicare    OT Start Time 1404    OT Stop Time 1444    OT Time Calculation (min) 40 min                 Past Medical History:  Diagnosis Date   Anemia    Anxiety    Arthritis    Hyperparathyroidism (HCC)    Hypertension    Major depressive disorder 08/15/2018   PONV (postoperative nausea and vomiting)    Happened one time. Has had sx since then with no N/V   Renal disorder 12/19/2018   Stage 4 ESRD. HD-MWF   Sleep apnea    does not use CPAP   Past Surgical History:  Procedure Laterality Date   A/V FISTULAGRAM  2019   ABDOMINAL HYSTERECTOMY  2005   AV FISTULA PLACEMENT Right 07/24/2023   Procedure: RIGHT UPPER EXTREMITY ARTERIOVENOUS GRAFT CREATION;  Surgeon: Chuck Hint, MD;  Location: [facility] OR;  Service: Vascular;  Laterality: Right;   AVGG REMOVAL Right 07/31/2023   Procedure: REMOVAL OF RIGHT ARM ARTERIOVENOUS GORETEX GRAFT (AVGG);  Surgeon: Daria Pastures, MD;  Location: [facility] OR;  Service: Vascular;  Laterality: Right;   BREAST EXCISIONAL BIOPSY Left 2017   scar not visible   CHOLECYSTECTOMY  2002   COLONOSCOPY  2010   COLONOSCOPY WITH PROPOFOL N/A 01/02/2023   Procedure: COLONOSCOPY WITH PROPOFOL;  Surgeon: Jeani Hawking, MD;  Location: [facility] ENDOSCOPY;  Service: Gastroenterology;  Laterality: N/A;   IR AV DIALY SHUNT INTRO NEEDLE/INTRAC INITIAL W/PTA/STENT/IMG LT  2021   Stent placed in Left Arm Fistula. Placed in [place]   LIGATION OF ARTERIOVENOUS  FISTULA Left 06/01/2023   Procedure: LIGATION OF LEFT ARM ARTERIOVENOUS  FISTULA WITH HEMATOMA EVACUATION;  Surgeon: Maeola Harman, MD;  Location: [facility] OR;  Service: Vascular;  Laterality: Left;   POLYPECTOMY  01/02/2023   Procedure: POLYPECTOMY;  Surgeon: Jeani Hawking, MD;  Location: [facility];  Service: Gastroenterology;;   REVERSE SHOULDER ARTHROPLASTY Right 06/01/2022   Procedure: REVERSE SHOULDER ARTHROPLASTY;  Surgeon: Bjorn Pippin, MD;  Location: [facility] OR;  Service: Orthopedics;  Laterality: Right;   REVERSE SHOULDER ARTHROPLASTY Left 09/21/2022   Procedure: REVERSE SHOULDER ARTHROPLASTY;  Surgeon: Bjorn Pippin, MD;  Location: [facility] OR;  Service: Orthopedics;  Laterality: Left;   TOTAL KNEE ARTHROPLASTY Right 2008   TUBAL LIGATION     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   Deep venous thrombosis (HCC) 08/15/2023   Confusion 08/14/2023   PRES (posterior reversible encephalopathy syndrome) 08/07/2023   ESRD on hemodialysis (HCC) 08/07/2023   OSA (obstructive sleep apnea) 08/07/2023   Anemia associated with chronic renal failure 08/07/2023   Arm paresthesia, right 08/07/2023   Hypertensive emergency 08/06/2023   Status post reverse total replacement of left shoulder 09/21/2022   Status post reverse total arthroplasty of right shoulder 06/01/2022   Peripheral vascular disease (HCC) 06/20/2021   Iron deficiency anemia, unspecified 12/26/2018   Secondary hyperparathyroidism (HCC) 07/11/2017    ONSET DATE: 07/31/23  REFERRING  DIAG: right hand weakness and numbness s/p AV graft G56.01 (ICD-10-CM) - Carpal tunnel syndrome, right upper limb  THERAPY DIAG:  Pain in right hand  Other lack of coordination  Other disturbances of skin sensation  Muscle weakness (generalized)  Rationale for Evaluation and Treatment: Rehabilitation  SUBJECTIVE:   SUBJECTIVE STATEMENT: Pt reports hand still tingles, reporting "pins and needles" in thumb, index, and long finger.  Pt reports seeing hand doctor and they discussed the possibility of doing  carpal tunnel release and "clean out" at damaged nerves to increase function.   Pt accompanied by: self  PERTINENT HISTORY: PMH ESRD on HD, HTN, L axillary acute DVT on Eliquis; She had a right upper extremity AVG placed by Dr. 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  The AVG placement of the right arm was complicated by pain numbness and tingling of her hand and fingers which necessitated ligation and excision on 07/31/2023.   PRECAUTIONS: Other: L axillary acute DVT on Eliquis  RED FLAGS: None   WEIGHT BEARING RESTRICTIONS: No  PAIN:  Are you having pain? No  FALLS: Has patient fallen in last 6 months? No  LIVING ENVIRONMENT: Lives with: lives alone, reports plan to move to a new place with daughter in January Lives in: House/apartment Stairs: Yes: External: 3 steps; on left going up Has following equipment at home: Single point cane and None  PLOF: Independent and Independent with basic ADLs  PATIENT GOALS: to be able to use my R hand again without pain/throbbing  NEXT MD VISIT: 11/23/23 with Dr. Hetty Blend, TBD with Dr. Fara Boros  OBJECTIVE:  Note: Objective measures were completed at Evaluation unless otherwise noted.  HAND DOMINANCE: Right  ADLs: Overall ADLs: Independent - Mod I Transfers/ambulation related to ADLs: Independent, does report utilizing SPC when L knee is bothering her Eating: difficulty with scooping (particularly resistance like ice cream), cutting with knife Tub Shower transfers: currently not taking a shower due to HD port  FUNCTIONAL OUTCOME MEASURES: Quick Dash: 54.54% impairment  UPPER EXTREMITY ROM:     Active ROM Right eval Left eval  Shoulder flexion 120 130  Shoulder abduction    Shoulder adduction    Shoulder extension    Shoulder internal rotation [facility]  Shoulder external rotation [facility] WFL  Elbow flexion WFL WFL  Elbow extension [facility] WFL  Wrist flexion 75   Wrist  extension 65   Wrist ulnar deviation    Wrist radial deviation    Wrist pronation    Wrist supination    (Blank rows = not tested)  HAND FUNCTION: Grip strength: Right: 14 lbs; Left: 45 lbs  COORDINATION: 9 Hole Peg test: Right: 47.81 sec; Left: 25.25 sec  SENSATION: Light touch: impaired along radial distribution with decreased sensation in long and medial side of ring finger, near absent in index finger on R  EDEMA: swelling initially s/p surgery, none at this time  COGNITION: Overall cognitive status: Within functional limits for tasks assessed  OBSERVATIONS: pt with guarding of RUE   TODAY'S TREATMENT:  DATE:  10/01/23 Pipe tree: Engaged in pipe tree puzzle in sitting with focus on coordination and manipulation of pieces, and increased shoulder flexion.  Pt demonstrating good functional use of RUE when picking up and rotating PVC piece prior to placing into pattern.  Therapist providing min cues to recall rotate prior to placing into pattern. Grip strengthening: engaged in flexion/extension with red flex bar to facilitate increased grip strengthening.  Pt completing 2x10 in both directions.  OT educated on wringing out towel at home to carry over to functional tasks. Grooved Pegs: with RUE for increased coordination. Pt placed pegs with one at a time and removed with in hand manipulation. Pt completed with min difficulty and 1 drops when placing into peg board, however with significantly increased drops with in-hand manipulation.  Pt utilizing vision to compensate for decreased sensation.  OT educated on completion of in-hand manipulation with coins to carry over to home activity.   09/27/23 Coordination: engaged in large grip peg pattern replication, incorporating rotation in finger tips prior to placing into resistive peg board.  Pt demonstrating good motor control when rotating peg in between thumb, index, and  long finger.  OT challenging pt to rotate clockwise and counterclockwise.   Pen tricks: engaged in rotation, flip, translation, and shift with pen. OT providing demonstration and adding drawings to instructions to facilitate increased carryover of technique. Pt demonstrating difficulty translation, benefiting from breaking task down and demonstration with coin.  Pt with improved ability to complete with pen over coin. OT encouraged pt to complete above with pen at this time, but to continue to attempt coin as she feels appropriate.  Coordination: w/ RUE including: picking up various small objects/coins and placing them in containers, picking up various small objects/coins and trying to see how many pt is able to hold in-hand without drops, picking up coins and stacking/unstacking them.  Pt reports needing to rely on vision to compensate for impaired sensation.    09/18/23 Resistance Clothespins 2, 4, 6# with RUE for mid functional reaching and sustained pinch. Pt completing 2# resistance without any issues, therefore increased to 4#.  Pt reports increased strain with 4# resistance clothespins.  Incorporating increased functional reach with placing 2,4,6# clothespins on vertical dowel with pt having increased difficulty with 6# clothespin, therefore returned to decreased resistance and then back to complete 6# resistance with pt able to complete.  Theraputty: engaged in pinch and pull, pinch and twist, and removal of 10 stones from yellow theraputty with increased focus on pinch and manipulation as needed to open various food containers.  Completed BUE pinch and pull to pull off 10 small pieces and then roll into small ball utilizing only finger tips on R hand.    PATIENT EDUCATION: Education details: grip strengthening, coordination, and motor control Person educated: Patient Education method: Explanation and Handouts Education comprehension: verbalized understanding and needs further  education  HOME EXERCISE PROGRAM: See pt instructions - carpal tunnel handout from [place] [facility]  Access Code: [email or web address] URL: [email or web address] Date: 09/18/2023 Prepared by: [facility] - Outpatient  Rehab - [facility]  Exercises - Hand AROM Tendon Gliding Series  - 3-4 x daily - 10 reps - Thumb Radial Adduction with Thumb Flexion AROM on Table  - 3-4 x daily - 10 reps - Thumb AROM Opposition  - 3-4 x daily - 10 reps - Rolling Putty on Table  - 2 x daily - 10 reps - Tip Pinch with Putty  - 2 x daily -  10 reps - 3-Point Pinch with Putty  - 2 x daily - 10 reps - Key Pinch with Putty  - 2 x daily - 10 reps - Finger Pinch and Pull with Putty  - 2 x daily - 10 reps - Putty Squeezes  - 2 x daily - 10 reps - Finger Lumbricals with Putty  - 2 x daily - 10 reps - Removing Marbles from Putty  - 2 x daily - 10 reps - Tip Pinch with Putty  - 2 x daily - 10 reps  GOALS: Goals reviewed with patient? No  SHORT TERM GOALS: Target date: 09/28/23  Pt will be independent with coordination and strengthening HEP. Baseline: Goal status: IN PROGRESS - Pt reports completing the putty exercises "sometimes" 09/27/23  2.  Pt will verbalize understanding of task modifications and/or potential AE needs to increase ease, safety, and independence w/ ADLs. Baseline:  Goal status: MET - 09/27/23  3.  Pt will verbalize understanding of use of modalities and desensitization strategies to decrease pain.  Baseline:  Goal status: MET - pt reports use of hand warmers and/or heater when hands are extra cold, takes tylenol scheduled to keep pain at bay - 09/27/23   LONG TERM GOALS: Target date: 10/19/23  Pt will demonstrate improved grip strength to 25# or greater to facilitate increased ease and independence with ADLs/IADLs (pulling up pants; cutting foods, scooping ice cream, holding a larger cup,opening food jars/containers). Baseline: R: 14# and L: 45# Goal status: IN  PROGRESS  2.  Pt will verbalize understanding of joint protection techniques (avoid repetitive hand/wrist movements, perform tasks with wrist in neutral, and modify activities that cause symptoms). Baseline:  Goal status: IN PROGRESS  3.  Pt will report decreased pain to 5/10 or less overall. Baseline:  Goal status: IN PROGRESS  4.  Pt will report and/or demonstrate improved ability to engage in handwriting tasks for 5 mins without pain increasing > 2/10 on pain scale. Baseline:  Goal status: IN PROGRESS  5.  Pt will improve functional ability by decreased impairment per Quick DASH assessment from 54% to 40% or better, for better quality of life. Baseline: 54.54% Goal status: IN PROGRESS  ASSESSMENT:  CLINICAL IMPRESSION: Pt demonstrating good tolerance to coordination tasks, demonstrating most difficulty with translation from palm to finger tips.  Pt demonstrating good sustained grasp during wrist flexion/extension with flex bar and reports desire to purchase flexbar as she felt the benefits of the added resistance over the use of towel.   PERFORMANCE DEFICITS: in functional skills including ADLs, IADLs, coordination, sensation, ROM, strength, pain, Fine motor control, Gross motor control, decreased knowledge of precautions, decreased knowledge of use of DME, and UE functional use and psychosocial skills including environmental adaptation.       PLAN:  OT FREQUENCY: 2x/week  OT DURATION: 6 weeks  PLANNED INTERVENTIONS: 97168 OT Re-evaluation, 97535 self care/ADL training, 16109 therapeutic exercise, 97530 therapeutic activity, 97112 neuromuscular re-education, 97140 manual therapy, 97010 moist heat, 97010 cryotherapy, compression bandaging, psychosocial skills training, coping strategies training, and patient/family education  RECOMMENDED OTHER SERVICES: NA  CONSULTED AND AGREED WITH PLAN OF CARE: Patient  PLAN FOR NEXT SESSION:  Radial and/or median nerve glides,     grip strengthening with hand gripper and/or flex bar,   review putty as needed.    Review and add to coordination HEP PRN   Rosalio Loud, OTR/L 10/01/2023, 2:07 PM    [facility] [address] [place], [address] [place], [place] [address] Phone # [phone] Fax # [phone]

## 2023-10-02 ENCOUNTER — Encounter: Payer: Medicare Other | Admitting: Occupational Therapy

## 2023-10-05 ENCOUNTER — Other Ambulatory Visit (HOSPITAL_COMMUNITY): Payer: Self-pay

## 2023-10-07 ENCOUNTER — Other Ambulatory Visit (HOSPITAL_COMMUNITY): Payer: Self-pay

## 2023-10-08 ENCOUNTER — Other Ambulatory Visit (HOSPITAL_COMMUNITY): Payer: Self-pay

## 2023-10-10 ENCOUNTER — Ambulatory Visit: Payer: Medicare Other | Attending: Vascular Surgery | Admitting: Occupational Therapy

## 2023-10-10 DIAGNOSIS — M6281 Muscle weakness (generalized): Secondary | ICD-10-CM | POA: Diagnosis present

## 2023-10-10 DIAGNOSIS — R278 Other lack of coordination: Secondary | ICD-10-CM | POA: Diagnosis present

## 2023-10-10 DIAGNOSIS — R208 Other disturbances of skin sensation: Secondary | ICD-10-CM | POA: Diagnosis present

## 2023-10-10 DIAGNOSIS — M79641 Pain in right hand: Secondary | ICD-10-CM | POA: Diagnosis present

## 2023-10-10 NOTE — Therapy (Signed)
OUTPATIENT OCCUPATIONAL THERAPY ORTHO  Treatment Note  Patient Name: Christina Vasquez MRN: [id number] DOB:02-Feb-1956, 67 y.o., female Today's Date: 10/10/2023  PCP: Carilyn Goodpasture, NP REFERRING PROVIDER: Daria Pastures, MD  END OF SESSION:  OT End of Session - 10/10/23 1347     Visit Number 6    Number of Visits 13    Date for OT Re-Evaluation 10/19/23    Authorization Type [employer] Medicare    OT Start Time 1401    OT Stop Time 1444    OT Time Calculation (min) 43 min    Activity Tolerance Patient tolerated treatment well    Behavior During Therapy WFL for tasks assessed/performed                 Past Medical History:  Diagnosis Date   Anemia    Anxiety    Arthritis    Hyperparathyroidism (HCC)    Hypertension    Major depressive disorder 08/15/2018   PONV (postoperative nausea and vomiting)    Happened one time. Has had sx since then with no N/V   Renal disorder 12/19/2018   Stage 4 ESRD. HD-MWF   Sleep apnea    does not use CPAP   Past Surgical History:  Procedure Laterality Date   A/V FISTULAGRAM  2019   ABDOMINAL HYSTERECTOMY  2005   AV FISTULA PLACEMENT Right 07/24/2023   Procedure: RIGHT UPPER EXTREMITY ARTERIOVENOUS GRAFT CREATION;  Surgeon: Chuck Hint, MD;  Location: [facility] OR;  Service: Vascular;  Laterality: Right;   AVGG REMOVAL Right 07/31/2023   Procedure: REMOVAL OF RIGHT ARM ARTERIOVENOUS GORETEX GRAFT (AVGG);  Surgeon: Daria Pastures, MD;  Location: [facility] OR;  Service: Vascular;  Laterality: Right;   BREAST EXCISIONAL BIOPSY Left 2017   scar not visible   CHOLECYSTECTOMY  2002   COLONOSCOPY  2010   COLONOSCOPY WITH PROPOFOL N/A 01/02/2023   Procedure: COLONOSCOPY WITH PROPOFOL;  Surgeon: Jeani Hawking, MD;  Location: [facility] END[facility]COPY;  Service: Gastroenterology;  Laterality: N/A;   IR AV DIALY SHUNT INTRO NEEDLE/INTRAC INITIAL W/PTA/STENT/IMG LT  2021   Stent placed in Left Arm Fistula. Placed in [place]   LIGATION OF ARTERIOVENOUS   FISTULA Left 06/01/2023   Procedure: LIGATION OF LEFT ARM ARTERIOVENOUS  FISTULA WITH HEMATOMA EVACUATION;  Surgeon: Maeola Harman, MD;  Location: [facility] OR;  Service: Vascular;  Laterality: Left;   POLYPECTOMY  01/02/2023   Procedure: POLYPECTOMY;  Surgeon: Jeani Hawking, MD;  Location: [facility];  Service: Gastroenterology;;   REVERSE SHOULDER ARTHROPLASTY Right 06/01/2022   Procedure: REVERSE SHOULDER ARTHROPLASTY;  Surgeon: Bjorn Pippin, MD;  Location: [facility] OR;  Service: Orthopedics;  Laterality: Right;   REVERSE SHOULDER ARTHROPLASTY Left 09/21/2022   Procedure: REVERSE SHOULDER ARTHROPLASTY;  Surgeon: Bjorn Pippin, MD;  Location: [facility] OR;  Service: Orthopedics;  Laterality: Left;   TOTAL KNEE ARTHROPLASTY Right 2008   TUBAL LIGATION     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   Deep venous thrombosis (HCC) 08/15/2023   Confusion 08/14/2023   PRES (posterior reversible encephalopathy syndrome) 08/07/2023   ESRD on hemodialysis (HCC) 08/07/2023   OSA (obstructive sleep apnea) 08/07/2023   Anemia associated with chronic renal failure 08/07/2023   Arm paresthesia, right 08/07/2023   Hypertensive emergency 08/06/2023   Status post reverse total replacement of left shoulder 09/21/2022   Status post reverse total arthroplasty of right shoulder 06/01/2022   Peripheral vascular disease (HCC) 06/20/2021  Iron deficiency anemia, unspecified 12/26/2018   Secondary hyperparathyroidism (HCC) 07/11/2017    ONSET DATE: 07/31/23  REFERRING DIAG: right hand weakness and numbness s/p AV graft G56.01 (ICD-10-CM) - Carpal tunnel syndrome, right upper limb  THERAPY DIAG:  Pain in right hand  Other lack of coordination  Other disturbances of skin sensation  Muscle weakness (generalized)  Rationale for Evaluation and Treatment: Rehabilitation  SUBJECTIVE:   SUBJECTIVE STATEMENT: Pt reports things are going well. Pt reports  continuing to work on picking up small coins and palm-to-finger/finger-to-palm translation. Pt is working on wringing out towels. Pt reports "trying my best" when completing exercises.  Pt accompanied by: self  PERTINENT HISTORY: PMH ESRD on HD, HTN, L axillary acute DVT on Eliquis; She had a right upper extremity AVG placed by Dr. 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  The AVG placement of the right arm was complicated by pain numbness and tingling of her hand and fingers which necessitated ligation and excision on 07/31/2023.   PRECAUTIONS: Other: L axillary acute DVT on Eliquis  RED FLAGS: None   WEIGHT BEARING RESTRICTIONS: No  PAIN:  Are you having pain? No  FALLS: Has patient fallen in last 6 months? No  LIVING ENVIRONMENT: Lives with: lives alone, reports plan to move to a new place with daughter in January Lives in: House/apartment Stairs: Yes: External: 3 steps; on left going up Has following equipment at home: Single point cane and None  PLOF: Independent and Independent with basic ADLs  PATIENT GOALS: to be able to use my R hand again without pain/throbbing  NEXT MD VISIT: 11/23/23 with Dr. Hetty Blend, TBD with Dr. Fara Boros  OBJECTIVE:  Note: Objective measures were completed at Evaluation unless otherwise noted.  HAND DOMINANCE: Right  ADLs: Overall ADLs: Independent - Mod I Transfers/ambulation related to ADLs: Independent, does report utilizing SPC when L knee is bothering her Eating: difficulty with scooping (particularly resistance like ice cream), cutting with knife Tub Shower transfers: currently not taking a shower due to HD port  FUNCTIONAL OUTCOME MEASURES: Quick Dash: 54.54% impairment  UPPER EXTREMITY ROM:     Active ROM Right eval Left eval  Shoulder flexion 120 130  Shoulder abduction    Shoulder adduction    Shoulder extension    Shoulder internal rotation [facility]   Shoulder external rotation [facility] WFL  Elbow flexion WFL WFL  Elbow extension [facility] WFL  Wrist flexion 75   Wrist extension 65   Wrist ulnar deviation    Wrist radial deviation    Wrist pronation    Wrist supination    (Blank rows = not tested)  HAND FUNCTION: Grip strength: Right: 14 lbs; Left: 45 lbs  COORDINATION: 9 Hole Peg test: Right: 47.81 sec; Left: 25.25 sec  SENSATION: Light touch: impaired along radial distribution with decreased sensation in long and medial side of ring finger, near absent in index finger on R  EDEMA: swelling initially s/p surgery, none at this time  COGNITION: Overall cognitive status: Within functional limits for tasks assessed  OBSERVATIONS: pt with guarding of RUE   TODAY'S TREATMENT:  DATE:   10/10/23 Ther[employer] OT educated pt on RUE joint protection. Handout provided, see pt instructions. Pt verbalized understanding. OT showed pt some online examples of gloves with padded palm to decrease vibration when gripping steering wheel for driving. Pt verbalized understanding.  TherEx OT initiated median nerve glides HEP - 10 to 15 s holds, 5 reps each -  to improve RUE median nerve gliding, to decrease symptoms of numbness/tingling affecting RUE. Handout provided, see pt instructions. Pt returned demonstration of exercises. Pt reported increased tingling of digits 1-3 after some exercises and OT noted pt demo'd substantial elbow flex. Therefore, OT educated pt on positioning of elbow to 90* or more extended. OT also educated pt on neutral wrist positioning. Pt returned demonstration and reported decreased tingling symptoms following adjustments to RUE positioning.  Flex bar (Red- supination and pronation. Green - twist) - 2 min each exercise (6 minutes total) - to increase RUE strengthening and coordination. Pt reported some "cramping" of RUE after final trial with green flex bar though  denied tingling/numbness symptoms. Pt reported "cramping" resolved after a few minutes of rest and gentle PROM stretch.  Pt reported not drinking any water since previous day which may have contributed to RUE "cramp." Pt reported "I don't like to drink water before dialysis." OT provided pt with water and OT educated pt on importance of hydration and keeping water bottle nearby to hydrate before therapy sessions. Pt verbalized understanding.    PATIENT EDUCATION: Education details: see above Person educated: Patient Education method: Explanation, Demonstration, and Handouts Education comprehension: verbalized understanding, returned demonstration, and needs further education  HOME EXERCISE PROGRAM: See pt instructions - carpal tunnel handout from [place] [facility]  Access Code: [email or web address] URL: [email or web address] Date: 09/18/2023 Prepared by: [facility] - Outpatient  Rehab - [facility]  Exercises - Hand AROM Tendon Gliding Series  - 3-4 x daily - 10 reps - Thumb Radial Adduction with Thumb Flexion AROM on Table  - 3-4 x daily - 10 reps - Thumb AROM Opposition  - 3-4 x daily - 10 reps - Rolling Putty on Table  - 2 x daily - 10 reps - Tip Pinch with Putty  - 2 x daily - 10 reps - 3-Point Pinch with Putty  - 2 x daily - 10 reps - Key Pinch with Putty  - 2 x daily - 10 reps - Finger Pinch and Pull with Putty  - 2 x daily - 10 reps - Putty Squeezes  - 2 x daily - 10 reps - Finger Lumbricals with Putty  - 2 x daily - 10 reps - Removing Marbles from Putty  - 2 x daily - 10 reps - Tip Pinch with Putty  - 2 x daily - 10 reps  10/10/23 - joint protection handout, median nerve glides (handouts, see pt instructions)  GOALS: Goals reviewed with patient? No  SHORT TERM GOALS: Target date: 09/28/23  Pt will be independent with coordination and strengthening HEP. Baseline: Goal status: IN PROGRESS - Pt reports completing the putty exercises "sometimes" 09/27/23  2.   Pt will verbalize understanding of task modifications and/or potential AE needs to increase ease, safety, and independence w/ ADLs. Baseline:  Goal status: MET - 09/27/23  3.  Pt will verbalize understanding of use of modalities and desensitization strategies to decrease pain.  Baseline:  Goal status: MET - pt reports use of hand warmers and/or heater when hands are extra cold, takes tylenol scheduled to keep pain at bay - 09/27/23  LONG TERM GOALS: Target date: 10/19/23  Pt will demonstrate improved grip strength to 25# or greater to facilitate increased ease and independence with ADLs/IADLs (pulling up pants; cutting foods, scooping ice cream, holding a larger cup,opening food jars/containers). Baseline: R: 14# and L: 45# Goal status: IN PROGRESS  2.  Pt will verbalize understanding of joint protection techniques (avoid repetitive hand/wrist movements, perform tasks with wrist in neutral, and modify activities that cause symptoms). Baseline:  Goal status: IN PROGRESS  3.  Pt will report decreased pain to 5/10 or less overall. Baseline:  Goal status: IN PROGRESS  4.  Pt will report and/or demonstrate improved ability to engage in handwriting tasks for 5 mins without pain increasing > 2/10 on pain scale. Baseline:  Goal status: IN PROGRESS  5.  Pt will improve functional ability by decreased impairment per Quick DASH assessment from 54% to 40% or better, for better quality of life. Baseline: 54.54% Goal status: IN PROGRESS  ASSESSMENT:  CLINICAL IMPRESSION: Pt tolerated tasks well. Pt benefited from education regarding RUE positioning to decrease symptoms during tasks and pt returned demonstration of improved RUE positioning. Pt would benefit from skilled OT services to address deficits and increase overall independence.     PERFORMANCE DEFICITS: in functional skills including ADLs, IADLs, coordination, sensation, ROM, strength, pain, Fine motor control, Gross motor control,  decreased knowledge of precautions, decreased knowledge of use of DME, and UE functional use and psychosocial skills including environmental adaptation.       PLAN:  OT FREQUENCY: 2x/week  OT DURATION: 6 weeks  PLANNED INTERVENTIONS: 97168 OT Re-evaluation, 97535 self care/ADL training, 16109 therapeutic exercise, 97530 therapeutic activity, 97112 neuromuscular re-education, 97140 manual therapy, 97010 moist heat, 97010 cryotherapy, compression bandaging, psychosocial skills training, coping strategies training, and patient/family education  RECOMMENDED OTHER SERVICES: NA  CONSULTED AND AGREED WITH PLAN OF CARE: Patient  PLAN FOR NEXT SESSION:  Provide pt instructions from previous OT session (pt accidentally left pt instructions in clinic after session)  ?Radial nerve glides  Continue grip strengthening with hand gripper and/or flex bar,   review putty as needed, Review median nerve glides PRN  Review and add to coordination HEP PRN   Wynetta Emery, OTR/L 10/10/2023, 4:52 PM    [facility] [address] [place], [place] [address] Phone # [phone] Fax # [phone]

## 2023-10-11 ENCOUNTER — Encounter: Payer: Medicare Other | Admitting: Occupational Therapy

## 2023-10-16 ENCOUNTER — Encounter: Payer: Medicare Other | Admitting: Occupational Therapy

## 2023-10-17 ENCOUNTER — Ambulatory Visit: Payer: Medicare Other | Admitting: Occupational Therapy

## 2023-10-17 DIAGNOSIS — M79641 Pain in right hand: Secondary | ICD-10-CM

## 2023-10-17 DIAGNOSIS — R278 Other lack of coordination: Secondary | ICD-10-CM

## 2023-10-17 DIAGNOSIS — M6281 Muscle weakness (generalized): Secondary | ICD-10-CM

## 2023-10-17 DIAGNOSIS — R208 Other disturbances of skin sensation: Secondary | ICD-10-CM

## 2023-10-17 NOTE — Therapy (Signed)
OUTPATIENT OCCUPATIONAL THERAPY ORTHO  Treatment Note  Patient Name: Christina Vasquez MRN: [id number] DOB:[birth year], 67 y.o., female Today's Date: 10/17/2023  PCP: Carilyn Goodpasture, NP REFERRING PROVIDER: Daria Pastures, MD  END OF SESSION:  OT End of Session - 10/17/23 1648     Visit Number 7    Number of Visits 13    Date for OT Re-Evaluation 10/19/23    Authorization Type [employer] Medicare    OT Start Time 1447    OT Stop Time 1530    OT Time Calculation (min) 43 min    Activity Tolerance Patient tolerated treatment well    Behavior During Therapy WFL for tasks assessed/performed                  Past Medical History:  Diagnosis Date   Anemia    Anxiety    Arthritis    Hyperparathyroidism (HCC)    Hypertension    Major depressive disorder 08/15/2018   PONV (postoperative nausea and vomiting)    Happened one time. Has had sx since then with no N/V   Renal disorder 12/19/2018   Stage 4 ESRD. HD-MWF   Sleep apnea    does not use CPAP   Past Surgical History:  Procedure Laterality Date   A/V FISTULAGRAM  2019   ABDOMINAL HYSTERECTOMY  2005   AV FISTULA PLACEMENT Right 07/24/2023   Procedure: RIGHT UPPER EXTREMITY ARTERIOVENOUS GRAFT CREATION;  Surgeon: Chuck Hint, MD;  Location: [facility] OR;  Service: Vascular;  Laterality: Right;   AVGG REMOVAL Right 07/31/2023   Procedure: REMOVAL OF RIGHT ARM ARTERIOVENOUS GORETEX GRAFT (AVGG);  Surgeon: Daria Pastures, MD;  Location: [facility] OR;  Service: Vascular;  Laterality: Right;   BREAST EXCISIONAL BIOPSY Left 2017   scar not visible   CHOLECYSTECTOMY  2002   COLONOSCOPY  2010   COLONOSCOPY WITH PROPOFOL N/A 01/02/2023   Procedure: COLONOSCOPY WITH PROPOFOL;  Surgeon: Jeani Hawking, MD;  Location: [facility] ENDOSCOPY;  Service: Gastroenterology;  Laterality: N/A;   IR AV DIALY SHUNT INTRO NEEDLE/INTRAC INITIAL W/PTA/STENT/IMG LT  2021   Stent placed in Left Arm Fistula. Placed in [place]   LIGATION OF ARTERIOVENOUS   FISTULA Left 06/01/2023   Procedure: LIGATION OF LEFT ARM ARTERIOVENOUS  FISTULA WITH HEMATOMA EVACUATION;  Surgeon: Maeola Harman, MD;  Location: [facility] OR;  Service: Vascular;  Laterality: Left;   POLYPECTOMY  01/02/2023   Procedure: POLYPECTOMY;  Surgeon: Jeani Hawking, MD;  Location: [facility];  Service: Gastroenterology;;   REVERSE SHOULDER ARTHROPLASTY Right 06/01/2022   Procedure: REVERSE SHOULDER ARTHROPLASTY;  Surgeon: Bjorn Pippin, MD;  Location: [facility] OR;  Service: Orthopedics;  Laterality: Right;   REVERSE SHOULDER ARTHROPLASTY Left 09/21/2022   Procedure: REVERSE SHOULDER ARTHROPLASTY;  Surgeon: Bjorn Pippin, MD;  Location: [facility] OR;  Service: Orthopedics;  Laterality: Left;   TOTAL KNEE ARTHROPLASTY Right 2008   TUBAL LIGATION     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   Deep venous thrombosis (HCC) 08/15/2023   Confusion 08/14/2023   PRES (posterior reversible encephalopathy syndrome) 08/07/2023   ESRD on hemodialysis (HCC) 08/07/2023   OSA (obstructive sleep apnea) 08/07/2023   Anemia associated with chronic renal failure 08/07/2023   Arm paresthesia, right 08/07/2023   Hypertensive emergency 08/06/2023   Status post reverse total replacement of left shoulder 09/21/2022   Status post reverse total arthroplasty of right shoulder 06/01/2022   Peripheral vascular disease (HCC) 06/20/2021  Iron deficiency anemia, unspecified 12/26/2018   Secondary hyperparathyroidism (HCC) 07/11/2017    ONSET DATE: 07/31/23  REFERRING DIAG: right hand weakness and numbness s/p AV graft G56.01 (ICD-10-CM) - Carpal tunnel syndrome, right upper limb  THERAPY DIAG:  Pain in right hand  Other lack of coordination  Other disturbances of skin sensation  Muscle weakness (generalized)  Rationale for Evaluation and Treatment: Rehabilitation  SUBJECTIVE:   SUBJECTIVE STATEMENT: Pt reports things are going well. Pt reports  continuing to work on picking up small coins and palm-to-finger/finger-to-palm translation, but reports "I will get it".  Pt reports feeling impulses in her index finger that woke her up the evening after last therapy visit.    Pt accompanied by: self  PERTINENT HISTORY: PMH ESRD on HD, HTN, L axillary acute DVT on Eliquis; She had a right upper extremity AVG placed by Dr. 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  The AVG placement of the right arm was complicated by pain numbness and tingling of her hand and fingers which necessitated ligation and excision on 07/31/2023.   PRECAUTIONS: Other: L axillary acute DVT on Eliquis  RED FLAGS: None   WEIGHT BEARING RESTRICTIONS: No  PAIN:  Are you having pain? No  FALLS: Has patient fallen in last 6 months? No  LIVING ENVIRONMENT: Lives with: lives alone, reports plan to move to a new place with daughter in January Lives in: House/apartment Stairs: Yes: External: 3 steps; on left going up Has following equipment at home: Single point cane and None  PLOF: Independent and Independent with basic ADLs  PATIENT GOALS: to be able to use my R hand again without pain/throbbing  NEXT MD VISIT: 11/23/23 with Dr. Hetty Blend, TBD with Dr. Fara Boros  OBJECTIVE:  Note: Objective measures were completed at Evaluation unless otherwise noted.  HAND DOMINANCE: Right  ADLs: Overall ADLs: Independent - Mod I Transfers/ambulation related to ADLs: Independent, does report utilizing SPC when L knee is bothering her Eating: difficulty with scooping (particularly resistance like ice cream), cutting with knife Tub Shower transfers: currently not taking a shower due to HD port  FUNCTIONAL OUTCOME MEASURES: Quick Dash: 54.54% impairment  UPPER EXTREMITY ROM:     Active ROM Right eval Left eval  Shoulder flexion 120 130  Shoulder abduction    Shoulder adduction    Shoulder extension     Shoulder internal rotation [facility]  Shoulder external rotation [facility] WFL  Elbow flexion WFL WFL  Elbow extension [facility] WFL  Wrist flexion 75   Wrist extension 65   Wrist ulnar deviation    Wrist radial deviation    Wrist pronation    Wrist supination    (Blank rows = not tested)  HAND FUNCTION: Grip strength: Right: 14 lbs; Left: 45 lbs  COORDINATION: 9 Hole Peg test: Right: 47.81 sec; Left: 25.25 sec  SENSATION: Light touch: impaired along radial distribution with decreased sensation in long and medial side of ring finger, near absent in index finger on R  EDEMA: swelling initially s/p surgery, none at this time  COGNITION: Overall cognitive status: Within functional limits for tasks assessed  OBSERVATIONS: pt with guarding of RUE   TODAY'S TREATMENT:  DATE:  10/17/23 Median nerve glides: OT reviewing median nerve glides from previous session.  Engaged in x5 with focus on slow, rhythmic movements.  OT cueing pt to hold for 10-15 seconds between each rep.  Pt with good carryover of positioning to allow for decreased tingling symptoms. Radial nerve glides: OT instructed pt in radial nerve glides x5 with cues and demonstration for slow, rhythmic movements. Pt holding each position 5 seconds between each rep.  OT providing instructions for improved positioning of elbow as pt initially in abducted position.  Self-care: reviewed education on decreasing engagement in repetitive movements or prolonged hold or grip, which may be increasing irritation to nerves.   PATIENT EDUCATION: Education details: see above Person educated: Patient Education method: Explanation, Demonstration, and Handouts Education comprehension: verbalized understanding, returned demonstration, and needs further education  HOME EXERCISE PROGRAM: See pt instructions - carpal tunnel handout from [place] [facility]  Access Code:  [email or web address] URL: [email or web address] Date: 09/18/2023 Prepared by: [facility] - Outpatient  Rehab - [facility]  Exercises - Hand AROM Tendon Gliding Series  - 3-4 x daily - 10 reps - Thumb Radial Adduction with Thumb Flexion AROM on Table  - 3-4 x daily - 10 reps - Thumb AROM Opposition  - 3-4 x daily - 10 reps - Rolling Putty on Table  - 2 x daily - 10 reps - Tip Pinch with Putty  - 2 x daily - 10 reps - 3-Point Pinch with Putty  - 2 x daily - 10 reps - Key Pinch with Putty  - 2 x daily - 10 reps - Finger Pinch and Pull with Putty  - 2 x daily - 10 reps - Putty Squeezes  - 2 x daily - 10 reps - Finger Lumbricals with Putty  - 2 x daily - 10 reps - Removing Marbles from Putty  - 2 x daily - 10 reps - Tip Pinch with Putty  - 2 x daily - 10 reps  10/10/23 - joint protection handout, median nerve glides (handouts, see pt instructions)  GOALS: Goals reviewed with patient? No  SHORT TERM GOALS: Target date: 09/28/23  Pt will be independent with coordination and strengthening HEP. Baseline: Goal status: IN PROGRESS - Pt reports completing the putty exercises "sometimes" 09/27/23  2.  Pt will verbalize understanding of task modifications and/or potential AE needs to increase ease, safety, and independence w/ ADLs. Baseline:  Goal status: MET - 09/27/23  3.  Pt will verbalize understanding of use of modalities and desensitization strategies to decrease pain.  Baseline:  Goal status: MET - pt reports use of hand warmers and/or heater when hands are extra cold, takes tylenol scheduled to keep pain at bay - 09/27/23   LONG TERM GOALS: Target date: 10/19/23  Pt will demonstrate improved grip strength to 25# or greater to facilitate increased ease and independence with ADLs/IADLs (pulling up pants; cutting foods, scooping ice cream, holding a larger cup,opening food jars/containers). Baseline: R: 14# and L: 45# Goal status: IN PROGRESS  2.  Pt will verbalize  understanding of joint protection techniques (avoid repetitive hand/wrist movements, perform tasks with wrist in neutral, and modify activities that cause symptoms). Baseline:  Goal status: IN PROGRESS  3.  Pt will report decreased pain to 5/10 or less overall. Baseline:  Goal status: IN PROGRESS  4.  Pt will report and/or demonstrate improved ability to engage in handwriting tasks for 5 mins without pain increasing > 2/10 on pain scale. Baseline:  Goal status:  IN PROGRESS  5.  Pt will improve functional ability by decreased impairment per Quick DASH assessment from 54% to 40% or better, for better quality of life. Baseline: 54.54% Goal status: IN PROGRESS  ASSESSMENT:  CLINICAL IMPRESSION: Pt tolerated tasks well. Pt benefited from education regarding RUE positioning to decrease symptoms during tasks.  Pt reporting understanding of joint protection strategies and reducing repetitive stress on nerves with prolonged positioning or repetition.     PERFORMANCE DEFICITS: in functional skills including ADLs, IADLs, coordination, sensation, ROM, strength, pain, Fine motor control, Gross motor control, decreased knowledge of precautions, decreased knowledge of use of DME, and UE functional use and psychosocial skills including environmental adaptation.       PLAN:  OT FREQUENCY: 2x/week  OT DURATION: 6 weeks  PLANNED INTERVENTIONS: 97168 OT Re-evaluation, 97535 self care/ADL training, 29528 therapeutic exercise, 97530 therapeutic activity, 97112 neuromuscular re-education, 97140 manual therapy, 97010 moist heat, 97010 cryotherapy, compression bandaging, psychosocial skills training, coping strategies training, and patient/family education  RECOMMENDED OTHER SERVICES: NA  CONSULTED AND AGREED WITH PLAN OF CARE: Patient  PLAN FOR NEXT SESSION:  REVIEW GOALS and discuss d/c  Continue grip strengthening with hand gripper and/or flex bar,   review putty as needed, Review median and  radial nerve glides PRN  Review and add to coordination HEP PRN   Masiyah Jorstad, Maralyn Sago, OTR/L 10/17/2023, 4:49 PM    [facility] [address] [place], [place] [address] Phone # [phone] Fax # [phone]

## 2023-10-18 ENCOUNTER — Ambulatory Visit: Payer: Medicare Other | Admitting: Occupational Therapy

## 2023-10-18 DIAGNOSIS — R208 Other disturbances of skin sensation: Secondary | ICD-10-CM

## 2023-10-18 DIAGNOSIS — M79641 Pain in right hand: Secondary | ICD-10-CM | POA: Diagnosis not present

## 2023-10-18 DIAGNOSIS — M6281 Muscle weakness (generalized): Secondary | ICD-10-CM

## 2023-10-18 DIAGNOSIS — R278 Other lack of coordination: Secondary | ICD-10-CM

## 2023-10-18 NOTE — Therapy (Signed)
OUTPATIENT OCCUPATIONAL THERAPY ORTHO  Treatment Note & Discharge Note  Patient Name: Christina Vasquez MRN: [id number] DOB:May 05, 1956, 67 y.o., female Today's Date: 10/18/2023  PCP: Carilyn Goodpasture, NP REFERRING PROVIDER: Daria Pastures, MD  OCCUPATIONAL THERAPY DISCHARGE SUMMARY  Visits from Start of Care: 8  Current functional level related to goals / functional outcomes: Pt has met 5/5 LTGs demonstrating significant improvements in coordination, hadnwriting tolerance, and grip strength.  Pt reports improvements in pain with no pain with handwriting or daily, functional tasks.     Remaining deficits: Pt still with tingling in thumb, index, and long finger.     Education / Equipment: Median and radial nerve glides, coordination HEP, education on joint protection strategies  Patient agrees to discharge. Patient goals were met. Patient is being discharged due to meeting the stated rehab goals..     END OF SESSION:  OT End of Session - 10/18/23 0841     Visit Number 8    Number of Visits 13    Date for OT Re-Evaluation 10/19/23    Authorization Type [employer] Medicare    OT Start Time 0800    OT Stop Time 0836    OT Time Calculation (min) 36 min    Activity Tolerance Patient tolerated treatment well    Behavior During Therapy WFL for tasks assessed/performed                   Past Medical History:  Diagnosis Date   Anemia    Anxiety    Arthritis    Hyperparathyroidism (HCC)    Hypertension    Major depressive disorder 08/15/2018   PONV (postoperative nausea and vomiting)    Happened one time. Has had sx since then with no N/V   Renal disorder 12/19/2018   Stage 4 ESRD. HD-MWF   Sleep apnea    does not use CPAP   Past Surgical History:  Procedure Laterality Date   A/V FISTULAGRAM  2019   ABDOMINAL HYSTERECTOMY  2005   AV FISTULA PLACEMENT Right 07/24/2023   Procedure: RIGHT UPPER EXTREMITY ARTERIOVENOUS GRAFT CREATION;  Surgeon: Chuck Hint,  MD;  Location: [facility] OR;  Service: Vascular;  Laterality: Right;   AVGG REMOVAL Right 07/31/2023   Procedure: REMOVAL OF RIGHT ARM ARTERIOVENOUS GORETEX GRAFT (AVGG);  Surgeon: Daria Pastures, MD;  Location: [facility] OR;  Service: Vascular;  Laterality: Right;   BREAST EXCISIONAL BIOPSY Left 2017   scar not visible   CHOLECYSTECTOMY  2002   COLONOSCOPY  2010   COLONOSCOPY WITH PROPOFOL N/A 01/02/2023   Procedure: COLONOSCOPY WITH PROPOFOL;  Surgeon: Jeani Hawking, MD;  Location: [facility];  Service: Gastroenterology;  Laterality: N/A;   IR AV DIALY SHUNT INTRO NEEDLE/INTRAC INITIAL W/PTA/STENT/IMG LT  2021   Stent placed in Left Arm Fistula. Placed in [place]   LIGATION OF ARTERIOVENOUS  FISTULA Left 06/01/2023   Procedure: LIGATION OF LEFT ARM ARTERIOVENOUS  FISTULA WITH HEMATOMA EVACUATION;  Surgeon: Maeola Harman, MD;  Location: [facility] OR;  Service: Vascular;  Laterality: Left;   POLYPECTOMY  01/02/2023   Procedure: POLYPECTOMY;  Surgeon: Jeani Hawking, MD;  Location: [facility];  Service: Gastroenterology;;   REVERSE SHOULDER ARTHROPLASTY Right 06/01/2022   Procedure: REVERSE SHOULDER ARTHROPLASTY;  Surgeon: Bjorn Pippin, MD;  Location: [facility] OR;  Service: Orthopedics;  Laterality: Right;   REVERSE SHOULDER ARTHROPLASTY Left 09/21/2022   Procedure: REVERSE SHOULDER ARTHROPLASTY;  Surgeon: Bjorn Pippin, MD;  Location: [facility] OR;  Service: Orthopedics;  Laterality: Left;  TOTAL KNEE ARTHROPLASTY Right 2008   TUBAL LIGATION     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   Deep venous thrombosis (HCC) 08/15/2023   Confusion 08/14/2023   PRES (posterior reversible encephalopathy syndrome) 08/07/2023   ESRD on hemodialysis (HCC) 08/07/2023   OSA (obstructive sleep apnea) 08/07/2023   Anemia associated with chronic renal failure 08/07/2023   Arm paresthesia, right 08/07/2023   Hypertensive emergency 08/06/2023   Status post reverse  total replacement of left shoulder 09/21/2022   Status post reverse total arthroplasty of right shoulder 06/01/2022   Peripheral vascular disease (HCC) 06/20/2021   Iron deficiency anemia, unspecified 12/26/2018   Secondary hyperparathyroidism (HCC) 07/11/2017    ONSET DATE: 07/31/23  REFERRING DIAG: right hand weakness and numbness s/p AV graft G56.01 (ICD-10-CM) - Carpal tunnel syndrome, right upper limb  THERAPY DIAG:  Pain in right hand  Other lack of coordination  Other disturbances of skin sensation  Muscle weakness (generalized)  Rationale for Evaluation and Treatment: Rehabilitation  SUBJECTIVE:   SUBJECTIVE STATEMENT: Pt reports trying to unscrew a top with her R hand and it went "okay".  Pt reports "it'll get there."  Pt accompanied by: self  PERTINENT HISTORY: PMH ESRD on HD, HTN, L axillary acute DVT on Eliquis; She had a right upper extremity AVG placed by Dr. 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  The AVG placement of the right arm was complicated by pain numbness and tingling of her hand and fingers which necessitated ligation and excision on 07/31/2023.   PRECAUTIONS: Other: L axillary acute DVT on Eliquis  RED FLAGS: None   WEIGHT BEARING RESTRICTIONS: No  PAIN:  Are you having pain? Stomach, reports eating salsa last night that may have upset her stomach  FALLS: Has patient fallen in last 6 months? No  LIVING ENVIRONMENT: Lives with: lives alone, reports plan to move to a new place with daughter in January Lives in: House/apartment Stairs: Yes: External: 3 steps; on left going up Has following equipment at home: Single point cane and None  PLOF: Independent and Independent with basic ADLs  PATIENT GOALS: to be able to use my R hand again without pain/throbbing  NEXT MD VISIT: 11/23/23 with Dr. Hetty Blend, TBD with Dr. Fara Boros  OBJECTIVE:  Note: Objective measures were completed  at Evaluation unless otherwise noted.  HAND DOMINANCE: Right  ADLs: Overall ADLs: Independent - Mod I Transfers/ambulation related to ADLs: Independent, does report utilizing SPC when L knee is bothering her Eating: difficulty with scooping (particularly resistance like ice cream), cutting with knife Tub Shower transfers: currently not taking a shower due to HD port  FUNCTIONAL OUTCOME MEASURES: 10/18/23: QuickDASH: 4.5% Quick Dash: 54.54% impairment  UPPER EXTREMITY ROM:     Active ROM Right eval Left eval  Shoulder flexion 120 130  Shoulder abduction    Shoulder adduction    Shoulder extension    Shoulder internal rotation [facility]  Shoulder external rotation [facility]  Elbow flexion [facility] WFL  Elbow extension [facility] WFL  Wrist flexion 75   Wrist extension 65   Wrist ulnar deviation    Wrist radial deviation    Wrist pronation    Wrist supination    (Blank rows = not tested)  HAND FUNCTION: 10/18/23: R: 30# Grip strength: Right: 14 lbs; Left: 45 lbs  COORDINATION: 10/18/23: 9 Hole Peg test:  Right: 24.38 sec 9 Hole Peg test: Right: 47.81 sec; Left: 25.25 sec  SENSATION: Light touch: impaired along radial distribution with decreased sensation in long and medial side of ring finger, near absent in index finger on R  EDEMA: swelling initially s/p surgery, none at this time  COGNITION: Overall cognitive status: Within functional limits for tasks assessed  OBSERVATIONS: pt with guarding of RUE   TODAY'S TREATMENT:                                                                      DATE:  10/18/23 QuickDASH: Pt reporting significant improvements in all areas, still reporting mild difficulty with opening tight or new jar and mild tinglings in hand/fingers tips on R.   Handwriting: pt demonstrating significant improvements in legibility and no reports of pain after writing short passage.  OT reviewed recommendation for wider pens and pens with pen grip for increased ease  and motor control.   Joint protection principles: educated pt on alternative positioning and holding strategies to decrease pressure and strain on wrist and hands.  Provided pt with handouts from OT toolkit.   Measurements (see above) taken: pt with improvements in coordination, grip strength, and QuickDASH report.   10/17/23 Median nerve glides: OT reviewing median nerve glides from previous session.  Engaged in x5 with focus on slow, rhythmic movements.  OT cueing pt to hold for 10-15 seconds between each rep.  Pt with good carryover of positioning to allow for decreased tingling symptoms. Radial nerve glides: OT instructed pt in radial nerve glides x5 with cues and demonstration for slow, rhythmic movements. Pt holding each position 5 seconds between each rep.  OT providing instructions for improved positioning of elbow as pt initially in abducted position.  Self-care: reviewed education on decreasing engagement in repetitive movements or prolonged hold or grip, which may be increasing irritation to nerves.   PATIENT EDUCATION: Education details: see above Person educated: Patient Education method: Explanation, Demonstration, and Handouts Education comprehension: verbalized understanding, returned demonstration, and needs further education  HOME EXERCISE PROGRAM: See pt instructions - carpal tunnel handout from [place] [facility]  Access Code: [email or web address] URL: [email or web address] Date: 09/18/2023 Prepared by: [facility] - [facility]  Exercises - Hand AROM Tendon Gliding Series  - 3-4 x daily - 10 reps - Thumb Radial Adduction with Thumb Flexion AROM on Table  - 3-4 x daily - 10 reps - Thumb AROM Opposition  - 3-4 x daily - 10 reps - Rolling Putty on Table  - 2 x daily - 10 reps - Tip Pinch with Putty  - 2 x daily - 10 reps - 3-Point Pinch with Putty  - 2 x daily - 10 reps - Key Pinch with Putty  - 2 x daily - 10 reps - Finger Pinch and Pull  with Putty  - 2 x daily - 10 reps - Putty Squeezes  - 2 x daily - 10 reps - Finger Lumbricals with Putty  - 2 x daily - 10 reps - Removing Marbles from Putty  - 2 x daily - 10 reps - Tip Pinch with Putty  - 2 x daily - 10 reps  10/10/23 - joint protection handout, median nerve glides (  handouts, see pt instructions)  GOALS: Goals reviewed with patient? No  SHORT TERM GOALS: Target date: 09/28/23  Pt will be independent with coordination and strengthening HEP. Baseline: Goal status: IN PROGRESS - Pt reports completing the putty exercises "sometimes" 09/27/23  2.  Pt will verbalize understanding of task modifications and/or potential AE needs to increase ease, safety, and independence w/ ADLs. Baseline:  Goal status: MET - 09/27/23  3.  Pt will verbalize understanding of use of modalities and desensitization strategies to decrease pain.  Baseline:  Goal status: MET - pt reports use of hand warmers and/or heater when hands are extra cold, takes tylenol scheduled to keep pain at bay - 09/27/23   LONG TERM GOALS: Target date: 10/19/23  Pt will demonstrate improved grip strength to 25# or greater to facilitate increased ease and independence with ADLs/IADLs (pulling up pants; cutting foods, scooping ice cream, holding a larger cup,opening food jars/containers). Baseline: R: 14# and L: 45# Goal status: MET - 30# on 10/18/23  2.  Pt will verbalize understanding of joint protection techniques (avoid repetitive hand/wrist movements, perform tasks with wrist in neutral, and modify activities that cause symptoms). Baseline:  Goal status: MET - 10/18/23  3.  Pt will report decreased pain to 5/10 or less overall. Baseline:  Goal status: MET - reports still having tingling but no pain on 10/18/23  4.  Pt will report and/or demonstrate improved ability to engage in handwriting tasks for 5 mins without pain increasing > 2/10 on pain scale. Baseline:  Goal status: MET - no pain after writing  10/18/23  5.  Pt will improve functional ability by decreased impairment per Quick DASH assessment from 54% to 40% or better, for better quality of life. Baseline: 54.54% Goal status: MET - 4.5% on 10/18/23  ASSESSMENT:  CLINICAL IMPRESSION: Pt reporting and demonstrating improvements in coordination, grip strength, and results on Quick DASH. Pt with significant improvements in legibility of handwriting and no reports of pain, reviewed use of pens with larger grip and padded grip to allow for increased control.  Pt benefited from education regarding RUE positioning to decrease symptoms during tasks.  Pt reporting understanding of joint protection strategies.   PERFORMANCE DEFICITS: in functional skills including ADLs, IADLs, coordination, sensation, ROM, strength, pain, Fine motor control, Gross motor control, decreased knowledge of precautions, decreased knowledge of use of DME, and UE functional use and psychosocial skills including environmental adaptation.       PLAN:  OT FREQUENCY: 2x/week  OT DURATION: 6 weeks  PLANNED INTERVENTIONS: 97168 OT Re-evaluation, 97535 self care/ADL training, 81191 therapeutic exercise, 97530 therapeutic activity, 97112 neuromuscular re-education, 97140 manual therapy, 97010 moist heat, 97010 cryotherapy, compression bandaging, psychosocial skills training, coping strategies training, and patient/family education  RECOMMENDED OTHER SERVICES: NA  CONSULTED AND AGREED WITH PLAN OF CARE: Patient  PLAN FOR NEXT SESSION:  D/C   Rosalio Loud, OTR/L 10/18/2023, 8:42 AM    [facility] at [facility] [address] [place], [place] [address] Phone # [phone] Fax # [phone]

## 2023-10-24 ENCOUNTER — Encounter: Payer: Medicare Other | Admitting: Neurology

## 2023-11-09 DIAGNOSIS — N186 End stage renal disease: Secondary | ICD-10-CM | POA: Diagnosis not present

## 2023-11-09 DIAGNOSIS — N2581 Secondary hyperparathyroidism of renal origin: Secondary | ICD-10-CM | POA: Diagnosis not present

## 2023-11-09 DIAGNOSIS — Z992 Dependence on renal dialysis: Secondary | ICD-10-CM | POA: Diagnosis not present

## 2023-11-09 DIAGNOSIS — T8249XA Other complication of vascular dialysis catheter, initial encounter: Secondary | ICD-10-CM | POA: Diagnosis not present

## 2023-11-09 DIAGNOSIS — D631 Anemia in chronic kidney disease: Secondary | ICD-10-CM | POA: Diagnosis not present

## 2023-11-09 DIAGNOSIS — D689 Coagulation defect, unspecified: Secondary | ICD-10-CM | POA: Diagnosis not present

## 2023-11-12 DIAGNOSIS — N2581 Secondary hyperparathyroidism of renal origin: Secondary | ICD-10-CM | POA: Diagnosis not present

## 2023-11-12 DIAGNOSIS — Z992 Dependence on renal dialysis: Secondary | ICD-10-CM | POA: Diagnosis not present

## 2023-11-12 DIAGNOSIS — D631 Anemia in chronic kidney disease: Secondary | ICD-10-CM | POA: Diagnosis not present

## 2023-11-12 DIAGNOSIS — N186 End stage renal disease: Secondary | ICD-10-CM | POA: Diagnosis not present

## 2023-11-12 DIAGNOSIS — D689 Coagulation defect, unspecified: Secondary | ICD-10-CM | POA: Diagnosis not present

## 2023-11-12 DIAGNOSIS — T8249XA Other complication of vascular dialysis catheter, initial encounter: Secondary | ICD-10-CM | POA: Diagnosis not present

## 2023-11-14 DIAGNOSIS — Z992 Dependence on renal dialysis: Secondary | ICD-10-CM | POA: Diagnosis not present

## 2023-11-14 DIAGNOSIS — T8249XA Other complication of vascular dialysis catheter, initial encounter: Secondary | ICD-10-CM | POA: Diagnosis not present

## 2023-11-14 DIAGNOSIS — N2581 Secondary hyperparathyroidism of renal origin: Secondary | ICD-10-CM | POA: Diagnosis not present

## 2023-11-14 DIAGNOSIS — D631 Anemia in chronic kidney disease: Secondary | ICD-10-CM | POA: Diagnosis not present

## 2023-11-14 DIAGNOSIS — D689 Coagulation defect, unspecified: Secondary | ICD-10-CM | POA: Diagnosis not present

## 2023-11-14 DIAGNOSIS — N186 End stage renal disease: Secondary | ICD-10-CM | POA: Diagnosis not present

## 2023-11-16 DIAGNOSIS — T8249XA Other complication of vascular dialysis catheter, initial encounter: Secondary | ICD-10-CM | POA: Diagnosis not present

## 2023-11-16 DIAGNOSIS — N2581 Secondary hyperparathyroidism of renal origin: Secondary | ICD-10-CM | POA: Diagnosis not present

## 2023-11-16 DIAGNOSIS — D631 Anemia in chronic kidney disease: Secondary | ICD-10-CM | POA: Diagnosis not present

## 2023-11-16 DIAGNOSIS — Z992 Dependence on renal dialysis: Secondary | ICD-10-CM | POA: Diagnosis not present

## 2023-11-16 DIAGNOSIS — N186 End stage renal disease: Secondary | ICD-10-CM | POA: Diagnosis not present

## 2023-11-16 DIAGNOSIS — D689 Coagulation defect, unspecified: Secondary | ICD-10-CM | POA: Diagnosis not present

## 2023-11-19 DIAGNOSIS — N186 End stage renal disease: Secondary | ICD-10-CM | POA: Diagnosis not present

## 2023-11-19 DIAGNOSIS — D689 Coagulation defect, unspecified: Secondary | ICD-10-CM | POA: Diagnosis not present

## 2023-11-19 DIAGNOSIS — N2581 Secondary hyperparathyroidism of renal origin: Secondary | ICD-10-CM | POA: Diagnosis not present

## 2023-11-19 DIAGNOSIS — T8249XA Other complication of vascular dialysis catheter, initial encounter: Secondary | ICD-10-CM | POA: Diagnosis not present

## 2023-11-19 DIAGNOSIS — D631 Anemia in chronic kidney disease: Secondary | ICD-10-CM | POA: Diagnosis not present

## 2023-11-19 DIAGNOSIS — Z992 Dependence on renal dialysis: Secondary | ICD-10-CM | POA: Diagnosis not present

## 2023-11-21 DIAGNOSIS — N186 End stage renal disease: Secondary | ICD-10-CM | POA: Diagnosis not present

## 2023-11-21 DIAGNOSIS — N2581 Secondary hyperparathyroidism of renal origin: Secondary | ICD-10-CM | POA: Diagnosis not present

## 2023-11-21 DIAGNOSIS — T8249XA Other complication of vascular dialysis catheter, initial encounter: Secondary | ICD-10-CM | POA: Diagnosis not present

## 2023-11-21 DIAGNOSIS — Z992 Dependence on renal dialysis: Secondary | ICD-10-CM | POA: Diagnosis not present

## 2023-11-21 DIAGNOSIS — D631 Anemia in chronic kidney disease: Secondary | ICD-10-CM | POA: Diagnosis not present

## 2023-11-21 DIAGNOSIS — D689 Coagulation defect, unspecified: Secondary | ICD-10-CM | POA: Diagnosis not present

## 2023-11-22 NOTE — Progress Notes (Addendum)
Patient ID: Christina Vasquez, female   DOB: 1956-01-13, 68 y.o.   MRN: [id number]  Reason for Consult: Follow-up   Referred by Alyson Ingles, PA*  Subjective:     HPI  Christina Vasquez is a 68 y.o. female who presents for follow-up after ligation and excision of right upper extremity AVG.  She had a right upper extremity AVG placed by Dr. 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  The AVG placement of the right arm was complicated by pain numbness and tingling of her hand and fingers which necessitated ligation and excision on 07/31/2023.  Her right hand motor and sensation has improved although not back to baseline.  She continues to dialyze through the right IJ [facility]    Past Medical History:  Diagnosis Date   Anemia    Anxiety    Arthritis    Hyperparathyroidism (HCC)    Hypertension    Major depressive disorder 08/15/2018   PONV (postoperative nausea and vomiting)    Happened one time. Has had sx since then with no N/V   Renal disorder 12/19/2018   Stage 4 ESRD. HD-MWF   Sleep apnea    does not use CPAP   Family History  Problem Relation Age of Onset   Breast cancer Sister    Breast cancer Cousin        maternal first cousin   Past Surgical History:  Procedure Laterality Date   A/V FISTULAGRAM  2019   ABDOMINAL HYSTERECTOMY  2005   AV FISTULA PLACEMENT Right 07/24/2023   Procedure: RIGHT UPPER EXTREMITY ARTERIOVENOUS GRAFT CREATION;  Surgeon: Chuck Hint, MD;  Location: [facility] OR;  Service: Vascular;  Laterality: Right;   AVGG REMOVAL Right 07/31/2023   Procedure: REMOVAL OF RIGHT ARM ARTERIOVENOUS GORETEX GRAFT (AVGG);  Surgeon: Daria Pastures, MD;  Location: [facility] OR;  Service: Vascular;  Laterality: Right;   BREAST EXCISIONAL BIOPSY Left 2017   scar not visible   CHOLECYSTECTOMY  2002   COLONOSCOPY  2010   COLONOSCOPY WITH PROPOFOL N/A 01/02/2023   Procedure: COLONOSCOPY WITH  PROPOFOL;  Surgeon: Jeani Hawking, MD;  Location: [facility] ENDOSCOPY;  Service: Gastroenterology;  Laterality: N/A;   IR AV DIALY SHUNT INTRO NEEDLE/INTRAC INITIAL W/PTA/STENT/IMG LT  2021   Stent placed in Left Arm Fistula. Placed in [place]   LIGATION OF ARTERIOVENOUS  FISTULA Left 06/01/2023   Procedure: LIGATION OF LEFT ARM ARTERIOVENOUS  FISTULA WITH HEMATOMA EVACUATION;  Surgeon: Maeola Harman, MD;  Location: [facility] OR;  Service: Vascular;  Laterality: Left;   POLYPECTOMY  01/02/2023   Procedure: POLYPECTOMY;  Surgeon: Jeani Hawking, MD;  Location: [facility];  Service: Gastroenterology;;   REVERSE SHOULDER ARTHROPLASTY Right 06/01/2022   Procedure: REVERSE SHOULDER ARTHROPLASTY;  Surgeon: Bjorn Pippin, MD;  Location: [facility] OR;  Service: Orthopedics;  Laterality: Right;   REVERSE SHOULDER ARTHROPLASTY Left 09/21/2022   Procedure: REVERSE SHOULDER ARTHROPLASTY;  Surgeon: Bjorn Pippin, MD;  Location: [facility] OR;  Service: Orthopedics;  Laterality: Left;   TOTAL KNEE ARTHROPLASTY Right 2008   TUBAL LIGATION      Short Social History:  Social History   Tobacco Use   Smoking status: Never   Smokeless tobacco: Never  Substance Use Topics   Alcohol use: Not Currently    Allergies  Allergen Reactions   Meloxicam Hives and Rash   Augmentin [Amoxicillin-Pot Clavulanate] Nausea And Vomiting   Tetracyclines & Related  Hives    Current Outpatient Medications  Medication Sig Dispense Refill   acetaminophen (TYLENOL) 650 MG CR tablet Take 1,300 mg by mouth every 8 (eight) hours as needed for pain.     amLODipine (NORVASC) 10 MG tablet Take 10 mg by mouth daily.     apixaban (ELIQUIS) 5 MG TABS tablet Take 1 tablet (5 mg total) by mouth 2 (two) times daily. 60 tablet 0   carvedilol (COREG) 25 MG tablet Take 1 tablet (25 mg total) by mouth 2 (two) times daily with a meal. Do not take on days before dialysis Do not take after dialysis if systolic blood pressure is less than 110. 60 tablet 2    cinacalcet (SENSIPAR) 60 MG tablet Take 60 mg by mouth every Monday, Wednesday, and Friday with hemodialysis.     folic acid (FOLVITE) 1 MG tablet Take 1 tablet (1 mg total) by mouth daily. 90 tablet 0   gabapentin (NEURONTIN) 100 MG capsule Take 2 capsules (200 mg total) by mouth 3 (three) times daily. 180 capsule 5   hydrALAZINE (APRESOLINE) 50 MG tablet Take 1 tablet (50 mg total) by mouth every 8 (eight) hours. Do not take on days before dialysis Do not take after dialysis if systolic blood pressure is less than 110. 90 tablet 2   Methoxy PEG-Epoetin Beta (MIRCERA IJ) Mircera     MOUNJARO 5 MG/0.5ML Pen Inject into the skin.     omeprazole (PRILOSEC) 40 MG capsule Take 40 mg by mouth daily.     sucroferric oxyhydroxide (VELPHORO) 500 MG chewable tablet Chew 1,500 mg by mouth 3 (three) times daily with meals.     venlafaxine XR (EFFEXOR-XR) 37.5 MG 24 hr capsule Take 37.5 mg by mouth daily with breakfast.     VITAMIN D PO Take 4 capsules by mouth every Monday, Wednesday, and Friday with hemodialysis.     No current facility-administered medications for this visit.    REVIEW OF SYSTEMS  Negative other than noted in HPI     Objective:  Objective   Vitals:   11/23/23 1359  BP: (!) 141/68  Pulse: 86  Resp: 20  Temp: 97.8 F (36.6 C)  SpO2: 98%  Weight: 221 lb (100.2 kg)  Height: 5\' 2"  (1.575 m)    Body mass index is 40.42 kg/m.  Physical Exam General: no acute distress Cardiac: hemodynamically stable, nontachycardic Pulm: normal work of breathing Neuro: alert, no focal deficit Extremities: Right upper extremity incisions healing well, Dermabond in place, no drainage or erythema. Vascular: Palpable radials bilaterally MSK: Decreased active range of motion of the right 1st through 3rd fingers.  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   Vein  mapping +-----------------+-------------+----------+--------+  Right Cephalic   Diameter (cm)Depth (cm)Findings  +-----------------+-------------+----------+--------+  Shoulder            0.19        1.09             +-----------------+-------------+----------+--------+  Prox upper arm       0.19        0.75             +-----------------+-------------+----------+--------+  Mid upper arm        0.21        0.91             +-----------------+-------------+----------+--------+  Dist upper arm       0.25  0.89             +-----------------+-------------+----------+--------+  Antecubital fossa    0.21        0.25             +-----------------+-------------+----------+--------+  Prox forearm         0.17        0.60             +-----------------+-------------+----------+--------+  Mid forearm          0.28        0.52             +-----------------+-------------+----------+--------+  Dist forearm         0.22        0.37             +-----------------+-------------+----------+--------+  Wrist               0.20        0.24             +-----------------+-------------+----------+--------+   +-----------------+-------------+----------+--------------+  Right Basilic    Diameter (cm)Depth (cm)   Findings     +-----------------+-------------+----------+--------------+  Prox upper arm       0.36        2.02                   +-----------------+-------------+----------+--------------+  Mid upper arm        0.35        1.94                   +-----------------+-------------+----------+--------------+  Dist upper arm       0.38        1.40                   +-----------------+-------------+----------+--------------+  Antecubital fossa    0.23        0.63                   +-----------------+-------------+----------+--------------+  Prox forearm         0.18        0.41                    +-----------------+-------------+----------+--------------+  Mid forearm                             not visualized  +-----------------+-------------+----------+--------------+  Distal forearm                          not visualized  +-----------------+-------------+----------+--------------+  Wrist                                  not visualized  +-----------------+-------------+----------+--------------+   +-----------------+-------------+----------+---------+  Left Cephalic    Diameter (cm)Depth (cm)Findings   +-----------------+-------------+----------+---------+  Prox upper arm                          prior avf  +-----------------+-------------+----------+---------+  Mid upper arm                           prior avf  +-----------------+-------------+----------+---------+  Dist upper arm  prior avf  +-----------------+-------------+----------+---------+  Antecubital fossa                       prior avf  +-----------------+-------------+----------+---------+   +-----------------+-------------+----------+---------+  Left Basilic     Diameter (cm)Depth (cm)Findings   +-----------------+-------------+----------+---------+  Prox upper arm       0.42        2.28              +-----------------+-------------+----------+---------+  Mid upper arm        0.44        1.96              +-----------------+-------------+----------+---------+  Dist upper arm       0.37        1.70              +-----------------+-------------+----------+---------+  Antecubital fossa    0.34        0.96   branching  +-----------------+-------------+----------+---------+  Prox forearm         0.35        1.08              +-----------------+-------------+----------+---------+  Mid forearm          0.24        0.34              +-----------------+-------------+----------+---------+  Distal forearm       0.18         0.38              +-----------------+-------------+----------+---------+       Assessment/Plan:     Christina Vasquez is a 68 y.o. female is ESRD who is status post right AVG ligation and excision on 07/31/2023 due to steal and possible IMN.  Her right hand has improved since her last visit although function is not back to baseline.  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  We discussed that the vein mapping today would demonstrated that the right basilic vein is adequate for fistula creation.  We discussed the risks and benefits and I explained that while a fistula a slightly lower risk of steal in diam and compared to graft it still not 0 and there is a possibility of having further nerve injury while creating the access on the right arm. She expressed understanding and elected to proceed with right arm brachiobasilic fistula creation.  Will plan to do this with general anesthesia and no regional block so we can assess the right arm appropriately in recovery she is already had an ischemic nerve injury with a prior access creation on that arm.  Plan for right brachiobasilic fistula creation on 12/13/2023    Daria Pastures MD [facility]

## 2023-11-23 ENCOUNTER — Telehealth: Payer: Self-pay | Admitting: *Deleted

## 2023-11-23 ENCOUNTER — Ambulatory Visit: Payer: Medicare Other | Admitting: Vascular Surgery

## 2023-11-23 ENCOUNTER — Encounter: Payer: Self-pay | Admitting: Vascular Surgery

## 2023-11-23 ENCOUNTER — Ambulatory Visit (HOSPITAL_COMMUNITY)
Admission: RE | Admit: 2023-11-23 | Discharge: 2023-11-23 | Disposition: A | Discharge status: Home / Self Care | Payer: Medicare Other | Source: Ambulatory Visit | Attending: Vascular Surgery | Admitting: Vascular Surgery

## 2023-11-23 ENCOUNTER — Encounter (HOSPITAL_COMMUNITY): Payer: Medicare Other

## 2023-11-23 ENCOUNTER — Other Ambulatory Visit: Payer: Self-pay | Admitting: *Deleted

## 2023-11-23 VITALS — BP 141/68 | HR 86 | Temp 97.8°F | Resp 20 | Ht 62.0 in | Wt 221.0 lb

## 2023-11-23 DIAGNOSIS — D631 Anemia in chronic kidney disease: Secondary | ICD-10-CM | POA: Diagnosis not present

## 2023-11-23 DIAGNOSIS — I808 Phlebitis and thrombophlebitis of other sites: Secondary | ICD-10-CM | POA: Insufficient documentation

## 2023-11-23 DIAGNOSIS — D689 Coagulation defect, unspecified: Secondary | ICD-10-CM | POA: Diagnosis not present

## 2023-11-23 DIAGNOSIS — N186 End stage renal disease: Secondary | ICD-10-CM

## 2023-11-23 DIAGNOSIS — T8249XA Other complication of vascular dialysis catheter, initial encounter: Secondary | ICD-10-CM | POA: Diagnosis not present

## 2023-11-23 DIAGNOSIS — Z992 Dependence on renal dialysis: Secondary | ICD-10-CM | POA: Diagnosis not present

## 2023-11-23 DIAGNOSIS — N2581 Secondary hyperparathyroidism of renal origin: Secondary | ICD-10-CM | POA: Diagnosis not present

## 2023-11-23 NOTE — Telephone Encounter (Signed)
Attempted to call for surgery scheduling. LVM

## 2023-11-26 DIAGNOSIS — D631 Anemia in chronic kidney disease: Secondary | ICD-10-CM | POA: Diagnosis not present

## 2023-11-26 DIAGNOSIS — Z992 Dependence on renal dialysis: Secondary | ICD-10-CM | POA: Diagnosis not present

## 2023-11-26 DIAGNOSIS — D689 Coagulation defect, unspecified: Secondary | ICD-10-CM | POA: Diagnosis not present

## 2023-11-26 DIAGNOSIS — T8249XA Other complication of vascular dialysis catheter, initial encounter: Secondary | ICD-10-CM | POA: Diagnosis not present

## 2023-11-26 DIAGNOSIS — N2581 Secondary hyperparathyroidism of renal origin: Secondary | ICD-10-CM | POA: Diagnosis not present

## 2023-11-26 DIAGNOSIS — N186 End stage renal disease: Secondary | ICD-10-CM | POA: Diagnosis not present

## 2023-11-28 DIAGNOSIS — Z992 Dependence on renal dialysis: Secondary | ICD-10-CM | POA: Diagnosis not present

## 2023-11-28 DIAGNOSIS — T8249XA Other complication of vascular dialysis catheter, initial encounter: Secondary | ICD-10-CM | POA: Diagnosis not present

## 2023-11-28 DIAGNOSIS — N186 End stage renal disease: Secondary | ICD-10-CM | POA: Diagnosis not present

## 2023-11-28 DIAGNOSIS — D689 Coagulation defect, unspecified: Secondary | ICD-10-CM | POA: Diagnosis not present

## 2023-11-28 DIAGNOSIS — D631 Anemia in chronic kidney disease: Secondary | ICD-10-CM | POA: Diagnosis not present

## 2023-11-28 DIAGNOSIS — N2581 Secondary hyperparathyroidism of renal origin: Secondary | ICD-10-CM | POA: Diagnosis not present

## 2023-11-30 DIAGNOSIS — N186 End stage renal disease: Secondary | ICD-10-CM | POA: Diagnosis not present

## 2023-11-30 DIAGNOSIS — Z992 Dependence on renal dialysis: Secondary | ICD-10-CM | POA: Diagnosis not present

## 2023-11-30 DIAGNOSIS — N2581 Secondary hyperparathyroidism of renal origin: Secondary | ICD-10-CM | POA: Diagnosis not present

## 2023-11-30 DIAGNOSIS — T8249XA Other complication of vascular dialysis catheter, initial encounter: Secondary | ICD-10-CM | POA: Diagnosis not present

## 2023-11-30 DIAGNOSIS — D689 Coagulation defect, unspecified: Secondary | ICD-10-CM | POA: Diagnosis not present

## 2023-11-30 DIAGNOSIS — D631 Anemia in chronic kidney disease: Secondary | ICD-10-CM | POA: Diagnosis not present

## 2023-12-03 DIAGNOSIS — N186 End stage renal disease: Secondary | ICD-10-CM | POA: Diagnosis not present

## 2023-12-03 DIAGNOSIS — Z992 Dependence on renal dialysis: Secondary | ICD-10-CM | POA: Diagnosis not present

## 2023-12-03 DIAGNOSIS — N2581 Secondary hyperparathyroidism of renal origin: Secondary | ICD-10-CM | POA: Diagnosis not present

## 2023-12-03 DIAGNOSIS — T8249XA Other complication of vascular dialysis catheter, initial encounter: Secondary | ICD-10-CM | POA: Diagnosis not present

## 2023-12-03 DIAGNOSIS — D631 Anemia in chronic kidney disease: Secondary | ICD-10-CM | POA: Diagnosis not present

## 2023-12-03 DIAGNOSIS — D689 Coagulation defect, unspecified: Secondary | ICD-10-CM | POA: Diagnosis not present

## 2023-12-05 DIAGNOSIS — D631 Anemia in chronic kidney disease: Secondary | ICD-10-CM | POA: Diagnosis not present

## 2023-12-05 DIAGNOSIS — Z992 Dependence on renal dialysis: Secondary | ICD-10-CM | POA: Diagnosis not present

## 2023-12-05 DIAGNOSIS — N2581 Secondary hyperparathyroidism of renal origin: Secondary | ICD-10-CM | POA: Diagnosis not present

## 2023-12-05 DIAGNOSIS — N186 End stage renal disease: Secondary | ICD-10-CM | POA: Diagnosis not present

## 2023-12-05 DIAGNOSIS — D689 Coagulation defect, unspecified: Secondary | ICD-10-CM | POA: Diagnosis not present

## 2023-12-05 DIAGNOSIS — T8249XA Other complication of vascular dialysis catheter, initial encounter: Secondary | ICD-10-CM | POA: Diagnosis not present

## 2023-12-07 DIAGNOSIS — T8249XA Other complication of vascular dialysis catheter, initial encounter: Secondary | ICD-10-CM | POA: Diagnosis not present

## 2023-12-07 DIAGNOSIS — N186 End stage renal disease: Secondary | ICD-10-CM | POA: Diagnosis not present

## 2023-12-07 DIAGNOSIS — D631 Anemia in chronic kidney disease: Secondary | ICD-10-CM | POA: Diagnosis not present

## 2023-12-07 DIAGNOSIS — N2581 Secondary hyperparathyroidism of renal origin: Secondary | ICD-10-CM | POA: Diagnosis not present

## 2023-12-07 DIAGNOSIS — Z992 Dependence on renal dialysis: Secondary | ICD-10-CM | POA: Diagnosis not present

## 2023-12-07 DIAGNOSIS — I129 Hypertensive chronic kidney disease with stage 1 through stage 4 chronic kidney disease, or unspecified chronic kidney disease: Secondary | ICD-10-CM | POA: Diagnosis not present

## 2023-12-07 DIAGNOSIS — D689 Coagulation defect, unspecified: Secondary | ICD-10-CM | POA: Diagnosis not present

## 2023-12-10 DIAGNOSIS — T8249XA Other complication of vascular dialysis catheter, initial encounter: Secondary | ICD-10-CM | POA: Diagnosis not present

## 2023-12-10 DIAGNOSIS — D689 Coagulation defect, unspecified: Secondary | ICD-10-CM | POA: Diagnosis not present

## 2023-12-10 DIAGNOSIS — L299 Pruritus, unspecified: Secondary | ICD-10-CM | POA: Diagnosis not present

## 2023-12-10 DIAGNOSIS — D631 Anemia in chronic kidney disease: Secondary | ICD-10-CM | POA: Diagnosis not present

## 2023-12-10 DIAGNOSIS — Z992 Dependence on renal dialysis: Secondary | ICD-10-CM | POA: Diagnosis not present

## 2023-12-10 DIAGNOSIS — N2581 Secondary hyperparathyroidism of renal origin: Secondary | ICD-10-CM | POA: Diagnosis not present

## 2023-12-10 DIAGNOSIS — N186 End stage renal disease: Secondary | ICD-10-CM | POA: Diagnosis not present

## 2023-12-12 ENCOUNTER — Encounter (HOSPITAL_COMMUNITY): Payer: Self-pay | Admitting: Vascular Surgery

## 2023-12-12 ENCOUNTER — Other Ambulatory Visit: Payer: Self-pay

## 2023-12-12 DIAGNOSIS — L299 Pruritus, unspecified: Secondary | ICD-10-CM | POA: Diagnosis not present

## 2023-12-12 DIAGNOSIS — D631 Anemia in chronic kidney disease: Secondary | ICD-10-CM | POA: Diagnosis not present

## 2023-12-12 DIAGNOSIS — T8249XA Other complication of vascular dialysis catheter, initial encounter: Secondary | ICD-10-CM | POA: Diagnosis not present

## 2023-12-12 DIAGNOSIS — N2581 Secondary hyperparathyroidism of renal origin: Secondary | ICD-10-CM | POA: Diagnosis not present

## 2023-12-12 DIAGNOSIS — N186 End stage renal disease: Secondary | ICD-10-CM | POA: Diagnosis not present

## 2023-12-12 DIAGNOSIS — D689 Coagulation defect, unspecified: Secondary | ICD-10-CM | POA: Diagnosis not present

## 2023-12-12 DIAGNOSIS — Z992 Dependence on renal dialysis: Secondary | ICD-10-CM | POA: Diagnosis not present

## 2023-12-12 NOTE — Progress Notes (Signed)
 PCP - Cristopher Bottcher, NP  Cardiologist -   PPM/ICD - denies Device Orders - n/a Rep Notified - n/a  Chest x-ray -  EKG - 08-06-23 Stress Test - 11-15-23 ECHO - 11-15-23 Cardiac Cath -   CPAP - unable to tolerated  DM denies  Blood Thinner Instructions: denies Aspirin  Instructions: n/a  ERAS Protcol - NPO  COVID TEST- n/a  Anesthesia review: no  Patient verbally denies any shortness of breath, fever, cough and chest pain during phone call   -------------  SDW INSTRUCTIONS given:  Your procedure is scheduled on December 13, 2023.  Report to [facility] Main Entrance A at 5:30 A.M., and check in at the Admitting office.  Call this number if you have problems the morning of surgery:  [phone]   Remember:  Do not eat or drink after midnight the night before your surgery    Take these medicines the morning of surgery with A SIP OF WATER  acetaminophen  (TYLENOL )  amLODipine  (NORVASC )  gabapentin  (NEURONTIN   hydrALAZINE  (APRESOLINE )  omeprazole (PRILOSEC)    As of today, STOP taking any Aspirin  (unless otherwise instructed by your surgeon) Aleve, Naproxen, Ibuprofen, Motrin, Advil, Goody's, BC's, all herbal medications, fish oil, and all vitamins.                      Do not wear jewelry, make up, or nail polish            Do not wear lotions, powders, perfumes/colognes, or deodorant.            Do not shave 48 hours prior to surgery.  Men may shave face and neck.            Do not bring valuables to the hospital.            [facility] is not responsible for any belongings or valuables.  Do NOT Smoke (Tobacco/Vaping) 24 hours prior to your procedure If you use a CPAP at night, you may bring all equipment for your overnight stay.   Contacts, glasses, dentures or bridgework may not be worn into surgery.      For patients admitted to the hospital, discharge time will be determined by your treatment team.   Patients discharged the day of surgery will not be  allowed to drive home, and someone needs to stay with them for 24 hours.    Special instructions:   [place]- Preparing For Surgery  Before surgery, you can play an important role. Because skin is not sterile, your skin needs to be as free of germs as possible. You can reduce the number of germs on your skin by washing with CHG (chlorahexidine gluconate) Soap before surgery.  CHG is an antiseptic cleaner which kills germs and bonds with the skin to continue killing germs even after washing.    Oral Hygiene is also important to reduce your risk of infection.  Remember - BRUSH YOUR TEETH THE MORNING OF SURGERY WITH YOUR REGULAR TOOTHPASTE  Please do not use if you have an allergy to CHG or antibacterial soaps. If your skin becomes reddened/irritated stop using the CHG.  Do not shave (including legs and underarms) for at least 48 hours prior to first CHG shower. It is OK to shave your face.  Please follow these instructions carefully.   Shower the NIGHT BEFORE SURGERY and the MORNING OF SURGERY with DIAL  Soap.   Pat yourself dry with a CLEAN TOWEL.  Wear CLEAN PAJAMAS to  bed the night before surgery  Place CLEAN SHEETS on your bed the night of your first shower and DO NOT SLEEP WITH PETS.   Day of Surgery: Please shower morning of surgery  Wear Clean/Comfortable clothing the morning of surgery Do not apply any deodorants/lotions.   Remember to brush your teeth WITH YOUR REGULAR TOOTHPASTE.   Questions were answered. Patient verbalized understanding of instructions.

## 2023-12-12 NOTE — Anesthesia Preprocedure Evaluation (Addendum)
 Anesthesia Evaluation  Patient identified by MRN, date of birth, ID band Patient awake    Reviewed: Allergy & Precautions, H&P , NPO status , Patient's Chart, lab work & pertinent test results  History of Anesthesia Complications (+) PONV and history of anesthetic complications  Airway Mallampati: II  TM Distance: >3 FB Neck ROM: Full    Dental no notable dental hx.    Pulmonary sleep apnea    Pulmonary exam normal breath sounds clear to auscultation       Cardiovascular hypertension, Pt. on medications and Pt. on home beta blockers + Peripheral Vascular Disease  Normal cardiovascular exam Rhythm:Regular Rate:Normal     Neuro/Psych  PSYCHIATRIC DISORDERS Anxiety Depression    negative neurological ROS     GI/Hepatic negative GI ROS, Neg liver ROS,  Medicated,,  Endo/Other  negative endocrine ROS    Renal/GU ESRF and DialysisRenal disease  negative genitourinary   Musculoskeletal  (+) Arthritis ,    Abdominal   Peds negative pediatric ROS (+)  Hematology  (+) Blood dyscrasia, anemia   Anesthesia Other Findings   Reproductive/Obstetrics negative OB ROS                             Anesthesia Physical Anesthesia Plan  ASA: 3  Anesthesia Plan: General   Post-op Pain Management: Tylenol  PO (pre-op )*   Induction: Intravenous  PONV Risk Score and Plan: 3 and Propofol  infusion, Ondansetron  and Treatment may vary due to age or medical condition  Airway Management Planned: Oral ETT  Additional Equipment:   Intra-op Plan:   Post-operative Plan: Extubation in OR  Informed Consent: I have reviewed the patients History and Physical, chart, labs and discussed the procedure including the risks, benefits and alternatives for the proposed anesthesia with the patient or authorized representative who has indicated his/her understanding and acceptance.     Dental advisory given  Plan  Discussed with: CRNA  Anesthesia Plan Comments:        Anesthesia Quick Evaluation

## 2023-12-13 ENCOUNTER — Encounter (HOSPITAL_COMMUNITY)
Admission: RE | Disposition: A | Discharge status: Home / Self Care | Payer: Self-pay | Source: Home / Self Care | Attending: Vascular Surgery

## 2023-12-13 ENCOUNTER — Other Ambulatory Visit: Payer: Self-pay

## 2023-12-13 ENCOUNTER — Ambulatory Visit (HOSPITAL_BASED_OUTPATIENT_CLINIC_OR_DEPARTMENT_OTHER): Payer: Medicare Other

## 2023-12-13 ENCOUNTER — Other Ambulatory Visit (HOSPITAL_COMMUNITY): Payer: Self-pay

## 2023-12-13 ENCOUNTER — Encounter (HOSPITAL_COMMUNITY): Payer: Self-pay | Admitting: Vascular Surgery

## 2023-12-13 ENCOUNTER — Ambulatory Visit (HOSPITAL_COMMUNITY)
Admission: RE | Admit: 2023-12-13 | Discharge: 2023-12-13 | Disposition: A | Discharge status: Home / Self Care | Payer: Medicare Other | Attending: Vascular Surgery | Admitting: Vascular Surgery

## 2023-12-13 ENCOUNTER — Ambulatory Visit (HOSPITAL_COMMUNITY): Payer: Medicare Other

## 2023-12-13 DIAGNOSIS — I739 Peripheral vascular disease, unspecified: Secondary | ICD-10-CM | POA: Insufficient documentation

## 2023-12-13 DIAGNOSIS — F419 Anxiety disorder, unspecified: Secondary | ICD-10-CM | POA: Diagnosis not present

## 2023-12-13 DIAGNOSIS — N186 End stage renal disease: Secondary | ICD-10-CM | POA: Insufficient documentation

## 2023-12-13 DIAGNOSIS — G4730 Sleep apnea, unspecified: Secondary | ICD-10-CM | POA: Insufficient documentation

## 2023-12-13 DIAGNOSIS — F32A Depression, unspecified: Secondary | ICD-10-CM | POA: Diagnosis not present

## 2023-12-13 DIAGNOSIS — Z79899 Other long term (current) drug therapy: Secondary | ICD-10-CM | POA: Diagnosis not present

## 2023-12-13 DIAGNOSIS — M1990 Unspecified osteoarthritis, unspecified site: Secondary | ICD-10-CM | POA: Insufficient documentation

## 2023-12-13 DIAGNOSIS — F418 Other specified anxiety disorders: Secondary | ICD-10-CM

## 2023-12-13 DIAGNOSIS — I120 Hypertensive chronic kidney disease with stage 5 chronic kidney disease or end stage renal disease: Secondary | ICD-10-CM | POA: Diagnosis not present

## 2023-12-13 DIAGNOSIS — Z992 Dependence on renal dialysis: Secondary | ICD-10-CM | POA: Diagnosis not present

## 2023-12-13 DIAGNOSIS — G4733 Obstructive sleep apnea (adult) (pediatric): Secondary | ICD-10-CM

## 2023-12-13 DIAGNOSIS — N185 Chronic kidney disease, stage 5: Secondary | ICD-10-CM

## 2023-12-13 HISTORY — PX: AV FISTULA PLACEMENT: SHX1204

## 2023-12-13 LAB — POCT I-STAT, CHEM 8
BUN: 34 mg/dL — ABNORMAL HIGH (ref 8–23)
Calcium, Ion: 1.17 mmol/L (ref 1.15–1.40)
Chloride: 100 mmol/L (ref 98–111)
Creatinine, Ser: 6.1 mg/dL — ABNORMAL HIGH (ref 0.44–1.00)
Glucose, Bld: 93 mg/dL (ref 70–99)
HCT: 36 % (ref 36.0–46.0)
Hemoglobin: 12.2 g/dL (ref 12.0–15.0)
Potassium: 4.3 mmol/L (ref 3.5–5.1)
Sodium: 140 mmol/L (ref 135–145)
TCO2: 29 mmol/L (ref 22–32)

## 2023-12-13 SURGERY — ARTERIOVENOUS (AV) FISTULA CREATION
Anesthesia: General | Site: Arm Upper | Laterality: Right

## 2023-12-13 MED ORDER — HYDROCODONE-ACETAMINOPHEN 5-325 MG PO TABS
1.0000 | ORAL_TABLET | Freq: Once | ORAL | Status: AC
  Administered 2023-12-13: 1 via ORAL

## 2023-12-13 MED ORDER — METOPROLOL TARTRATE 12.5 MG HALF TABLET
25.0000 mg | ORAL_TABLET | Freq: Once | ORAL | Status: AC
  Administered 2023-12-13: 25 mg via ORAL

## 2023-12-13 MED ORDER — FENTANYL CITRATE (PF) 100 MCG/2ML IJ SOLN
INTRAMUSCULAR | Status: AC
  Filled 2023-12-13: qty 2

## 2023-12-13 MED ORDER — 0.9 % SODIUM CHLORIDE (POUR BTL) OPTIME
TOPICAL | Status: DC | PRN
  Administered 2023-12-13: 1000 mL

## 2023-12-13 MED ORDER — CHLORHEXIDINE GLUCONATE 4 % EX SOLN: 60.0000 mL | Freq: Once | CUTANEOUS | Status: DC

## 2023-12-13 MED ORDER — PHENYLEPHRINE 80 MCG/ML (10ML) SYRINGE FOR IV PUSH (FOR BLOOD PRESSURE SUPPORT)
PREFILLED_SYRINGE | INTRAVENOUS | Status: AC
  Filled 2023-12-13: qty 10

## 2023-12-13 MED ORDER — CIPROFLOXACIN IN D5W 400 MG/200ML IV SOLN
400.0000 mg | INTRAVENOUS | Status: AC
  Administered 2023-12-13: 400 mg via INTRAVENOUS
  Filled 2023-12-13: qty 200

## 2023-12-13 MED ORDER — SUGAMMADEX SODIUM 200 MG/2ML IV SOLN
INTRAVENOUS | Status: DC | PRN
  Administered 2023-12-13: 250 mg via INTRAVENOUS

## 2023-12-13 MED ORDER — HEPARIN 6000 UNIT IRRIGATION SOLUTION
Status: AC
  Filled 2023-12-13: qty 500

## 2023-12-13 MED ORDER — PHENYLEPHRINE HCL-NACL 20-0.9 MG/250ML-% IV SOLN
INTRAVENOUS | Status: DC | PRN
  Administered 2023-12-13: 40 ug/min via INTRAVENOUS

## 2023-12-13 MED ORDER — ONDANSETRON HCL 4 MG/2ML IJ SOLN
INTRAMUSCULAR | Status: DC | PRN
  Administered 2023-12-13: 4 mg via INTRAVENOUS

## 2023-12-13 MED ORDER — DROPERIDOL 2.5 MG/ML IJ SOLN
0.6250 mg | Freq: Once | INTRAMUSCULAR | Status: DC | PRN
Start: 2023-12-13 — End: 2023-12-15

## 2023-12-13 MED ORDER — ONDANSETRON HCL 4 MG/2ML IJ SOLN
INTRAMUSCULAR | Status: AC
  Filled 2023-12-13: qty 2

## 2023-12-13 MED ORDER — SODIUM CHLORIDE 0.9 % IV SOLN: INTRAVENOUS | Status: DC

## 2023-12-13 MED ORDER — MIDAZOLAM HCL 2 MG/2ML IJ SOLN
INTRAMUSCULAR | Status: AC
Start: 2023-12-13 — End: ?
  Filled 2023-12-13: qty 2

## 2023-12-13 MED ORDER — HEPARIN SODIUM (PORCINE) 1000 UNIT/ML IJ SOLN
INTRAMUSCULAR | Status: AC
  Filled 2023-12-13: qty 10

## 2023-12-13 MED ORDER — METOPROLOL TARTRATE 12.5 MG HALF TABLET
ORAL_TABLET | ORAL | Status: AC
  Filled 2023-12-13: qty 2

## 2023-12-13 MED ORDER — LIDOCAINE 2% (20 MG/ML) 5 ML SYRINGE
INTRAMUSCULAR | Status: DC | PRN
  Administered 2023-12-13: 100 mg via INTRAVENOUS

## 2023-12-13 MED ORDER — FENTANYL CITRATE (PF) 100 MCG/2ML IJ SOLN
25.0000 ug | INTRAMUSCULAR | Status: DC | PRN
  Administered 2023-12-13: 50 ug via INTRAVENOUS

## 2023-12-13 MED ORDER — HEMOSTATIC AGENTS (NO CHARGE) OPTIME
TOPICAL | Status: DC | PRN
  Administered 2023-12-13: 1 via TOPICAL

## 2023-12-13 MED ORDER — LIDOCAINE 2% (20 MG/ML) 5 ML SYRINGE
INTRAMUSCULAR | Status: AC
  Filled 2023-12-13: qty 5

## 2023-12-13 MED ORDER — ORAL CARE MOUTH RINSE: 15.0000 mL | Freq: Once | OROMUCOSAL | Status: AC

## 2023-12-13 MED ORDER — HEPARIN 6000 UNIT IRRIGATION SOLUTION
Status: DC | PRN
  Administered 2023-12-13: 1

## 2023-12-13 MED ORDER — ROCURONIUM BROMIDE 10 MG/ML (PF) SYRINGE
PREFILLED_SYRINGE | INTRAVENOUS | Status: DC | PRN
  Administered 2023-12-13: 10 mg via INTRAVENOUS
  Administered 2023-12-13: 60 mg via INTRAVENOUS

## 2023-12-13 MED ORDER — FENTANYL CITRATE (PF) 250 MCG/5ML IJ SOLN
INTRAMUSCULAR | Status: AC
  Filled 2023-12-13: qty 5

## 2023-12-13 MED ORDER — MIDAZOLAM HCL 2 MG/2ML IJ SOLN
INTRAMUSCULAR | Status: DC | PRN
  Administered 2023-12-13: 1 mg via INTRAVENOUS

## 2023-12-13 MED ORDER — HEPARIN SODIUM (PORCINE) 1000 UNIT/ML IJ SOLN
INTRAMUSCULAR | Status: DC | PRN
  Administered 2023-12-13: 3000 [IU] via INTRAVENOUS

## 2023-12-13 MED ORDER — ROCURONIUM BROMIDE 10 MG/ML (PF) SYRINGE
PREFILLED_SYRINGE | INTRAVENOUS | Status: AC
  Filled 2023-12-13: qty 10

## 2023-12-13 MED ORDER — OXYCODONE HCL 5 MG/5ML PO SOLN: 5.0000 mg | Freq: Once | ORAL | Status: DC | PRN

## 2023-12-13 MED ORDER — ACETAMINOPHEN 10 MG/ML IV SOLN: 1000.0000 mg | Freq: Once | INTRAVENOUS | Status: DC | PRN

## 2023-12-13 MED ORDER — HYDROCODONE-ACETAMINOPHEN 5-325 MG PO TABS
ORAL_TABLET | ORAL | Status: AC
  Filled 2023-12-13: qty 1

## 2023-12-13 MED ORDER — HYDROCODONE-ACETAMINOPHEN 5-325 MG PO TABS
1.0000 | ORAL_TABLET | Freq: Four times a day (QID) | ORAL | 0 refills | Status: DC | PRN
  Filled 2023-12-13: qty 15, 4d supply, fill #0

## 2023-12-13 MED ORDER — OXYCODONE HCL 5 MG PO TABS: 5.0000 mg | ORAL_TABLET | Freq: Once | ORAL | Status: DC | PRN

## 2023-12-13 MED ORDER — CHLORHEXIDINE GLUCONATE 0.12 % MT SOLN
15.0000 mL | Freq: Once | OROMUCOSAL | Status: AC
  Administered 2023-12-13: 15 mL via OROMUCOSAL
  Filled 2023-12-13: qty 15

## 2023-12-13 MED ORDER — PHENYLEPHRINE 80 MCG/ML (10ML) SYRINGE FOR IV PUSH (FOR BLOOD PRESSURE SUPPORT)
PREFILLED_SYRINGE | INTRAVENOUS | Status: DC | PRN
  Administered 2023-12-13: 80 ug via INTRAVENOUS
  Administered 2023-12-13: 160 ug via INTRAVENOUS

## 2023-12-13 MED ORDER — ACETAMINOPHEN 500 MG PO TABS
1000.0000 mg | ORAL_TABLET | Freq: Once | ORAL | Status: DC
  Filled 2023-12-13: qty 2

## 2023-12-13 MED ORDER — FENTANYL CITRATE (PF) 250 MCG/5ML IJ SOLN
INTRAMUSCULAR | Status: DC | PRN
  Administered 2023-12-13: 50 ug via INTRAVENOUS
  Administered 2023-12-13: 100 ug via INTRAVENOUS
  Administered 2023-12-13 (×2): 50 ug via INTRAVENOUS

## 2023-12-13 MED ORDER — PROPOFOL 10 MG/ML IV BOLUS
INTRAVENOUS | Status: DC | PRN
  Administered 2023-12-13: 150 mg via INTRAVENOUS
  Administered 2023-12-13: 20 ug/kg/min via INTRAVENOUS

## 2023-12-13 SURGICAL SUPPLY — 28 items
ARMBAND PINK RESTRICT EXTREMIT (MISCELLANEOUS) ×1 IMPLANT
BAG COUNTER SPONGE SURGICOUNT (BAG) ×1 IMPLANT
CANISTER SUCT 3000ML PPV (MISCELLANEOUS) ×1 IMPLANT
CLIP TI MEDIUM 6 (CLIP) ×1 IMPLANT
CLIP TI WIDE RED SMALL 6 (CLIP) ×1 IMPLANT
COVER PROBE W GEL 5X96 (DRAPES) IMPLANT
DERMABOND ADVANCED .7 DNX12 (GAUZE/BANDAGES/DRESSINGS) ×1 IMPLANT
ELECT REM PT RETURN 9FT ADLT (ELECTROSURGICAL) ×1 IMPLANT
ELECTRODE REM PT RTRN 9FT ADLT (ELECTROSURGICAL) ×1 IMPLANT
GLOVE BIOGEL PI IND STRL 7.0 (GLOVE) ×1 IMPLANT
GOWN STRL REUS W/ TWL LRG LVL3 (GOWN DISPOSABLE) ×2 IMPLANT
GOWN STRL REUS W/ TWL XL LVL3 (GOWN DISPOSABLE) ×1 IMPLANT
INSERT FOGARTY SM (MISCELLANEOUS) IMPLANT
KIT BASIN OR (CUSTOM PROCEDURE TRAY) ×1 IMPLANT
KIT TURNOVER KIT B (KITS) ×1 IMPLANT
LOOP VESSEL MINI RED (MISCELLANEOUS) IMPLANT
NS IRRIG 1000ML POUR BTL (IV SOLUTION) ×1 IMPLANT
PACK CV ACCESS (CUSTOM PROCEDURE TRAY) ×1 IMPLANT
PAD ARMBOARD 7.5X6 YLW CONV (MISCELLANEOUS) ×2 IMPLANT
POWDER SURGICEL 3.0 GRAM (HEMOSTASIS) IMPLANT
SLING ARM FOAM STRAP LRG (SOFTGOODS) IMPLANT
SLING ARM FOAM STRAP MED (SOFTGOODS) IMPLANT
SUT MNCRL AB 4-0 PS2 18 (SUTURE) ×1 IMPLANT
SUT PROLENE 6 0 BV (SUTURE) ×1 IMPLANT
SUT VIC AB 3-0 SH 27X BRD (SUTURE) ×1 IMPLANT
TOWEL GREEN STERILE (TOWEL DISPOSABLE) ×1 IMPLANT
UNDERPAD 30X36 HEAVY ABSORB (UNDERPADS AND DIAPERS) ×1 IMPLANT
WATER STERILE IRR 1000ML POUR (IV SOLUTION) ×1 IMPLANT

## 2023-12-13 NOTE — Interval H&P Note (Signed)
 History and Physical Interval Note:  12/13/2023 7:22 AM  Christina Vasquez  has presented today for surgery, with the diagnosis of ESRD.  The various methods of treatment have been discussed with the patient and family. After consideration of risks, benefits and other options for treatment, the patient has consented to  Procedure(s): RIGHT ARTERIOVENOUS (AV) FISTULA CREATION (Right) as a surgical intervention.  The patient's history has been reviewed, patient examined, no change in status, stable for surgery.  I have reviewed the patient's chart and labs.  Questions were answered to the patient's satisfaction.     Norman GORMAN Serve

## 2023-12-13 NOTE — Transfer of Care (Signed)
 Immediate Anesthesia Transfer of Care Note  Patient: Christina Vasquez  Procedure(s) Performed: RIGHT ARTERIOVENOUS BRACHIOCEPHALIC FISTULA CREATION (Right: Arm Upper)  Patient Location: PACU  Anesthesia Type:General  Level of Consciousness: awake, alert , and oriented  Airway & Oxygen Therapy: Patient Spontanous Breathing and Patient connected to face mask oxygen  Post-op Assessment: Report given to RN and Post -op Vital signs reviewed and stable  Post vital signs: Reviewed and stable  Last Vitals:  Vitals Value Taken Time  BP 164/93 12/13/23 0919  Temp    Pulse 78 12/13/23 0921  Resp 14 12/13/23 0921  SpO2 100 % 12/13/23 0921  Vitals shown include unfiled device data.  Last Pain:  Vitals:   12/13/23 0614  TempSrc: Oral  PainSc:          Complications: There were no known notable events for this encounter.

## 2023-12-13 NOTE — Discharge Instructions (Signed)

## 2023-12-13 NOTE — Anesthesia Postprocedure Evaluation (Signed)
 Anesthesia Post Note  Patient: Christina Vasquez  Procedure(s) Performed: RIGHT ARTERIOVENOUS BRACHIOCEPHALIC FISTULA CREATION (Right: Arm Upper)     Patient location during evaluation: PACU Anesthesia Type: General Level of consciousness: awake and alert Pain management: pain level controlled Vital Signs Assessment: post-procedure vital signs reviewed and stable Respiratory status: spontaneous breathing, nonlabored ventilation, respiratory function stable and patient connected to nasal cannula oxygen Cardiovascular status: blood pressure returned to baseline and stable Postop Assessment: no apparent nausea or vomiting Anesthetic complications: no   There were no known notable events for this encounter.  Last Vitals:  Vitals:   12/13/23 0945 12/13/23 1000  BP: 123/78 (!) 132/90  Pulse: 82 79  Resp: 13 18  Temp:  37.2 C  SpO2: 99% 95%    Last Pain:  Vitals:   12/13/23 0945  TempSrc:   PainSc: 7                  Thom JONELLE Peoples

## 2023-12-13 NOTE — Op Note (Signed)
    OPERATIVE NOTE  PROCEDURE:   Intraoperative right arm vein mapping right arm 1st stage brachiobasilic AVF creation  PRE-OPERATIVE DIAGNOSIS: ESRD  POST-OPERATIVE DIAGNOSIS: same as above   SURGEON: Norman GORMAN Serve MD  ASSISTANT(S): Adina [email or web address]  Given the complexity of the case,  the assistant was necessary in order to expedient the procedure and safely perform the technical aspects of the operation.  The assistant provided traction and countertraction to assist with exposure of the artery and vein.  They also assisted with suture ligation of multiple venous branches.  They played a critical role in the anastomosis. These skills, especially following the Prolene suture for the anastomosis, could not have been adequately performed by a scrub tech assistant.   ANESTHESIA: general  ESTIMATED BLOOD LOSS: 10 cc  FINDING(S): Sufficiently sized right arm basilic vein from the upper arm to the [facility] fossa Sufficiently sized right brachial artery at the [facility] fossa Palpable and doppler thrill in AVF with multiphasic radial artery on completion  SPECIMEN(S):  none  INDICATIONS:   Christina Vasquez is a 68 y.o. female with ESRD. The patient is currently on dialysis via [facility]. They were seen in the office for evaluation of hemodialysis access. The risks an benefits including of access creation were reviewed including: need for additional procedures, need for additional creations, steal, ischemia monomelic neuropathy, failure of access, and bleeding. The patient expressed understand and is willing to proceed.    DESCRIPTION: The patient was brought to the operating room positioned supine on the operating table.  The right arm was prepped and draped in usual sterile fashion.  Timeout was performed and preoperative antibiotics were administered.  The basilic vein in the right arm was identified using ultrasound and appeared of sufficient size. A transverse incision was made above the elbow creese in  the antecubital fossa. The basilic vein was identified and isolated for 4 cm in length.  The bicipital aponeurosis was partially released and the brachial artery was dissected free from its paired brachial veins and secured with a vessel loop. The patient was heparinized. The basilic vein was marked and ligated distally with 2-0 silk, then flushed with heparinized saline. Vascular clamps were placed proximally and distally on the brachial artery and a 5 mm arteriotomy  was created on the brachial artery. This was flushed with heparin  saline. The vein was juxtaposed to the artery and an anastomosis was created using 6-0 Prolene.  Prior to completing the anastomsis, the vessels were flushed and the suture line was tied down. There was an excellent doppler thrill in the basilic vein from the anastomosis into the upper arm and a multiphasic radial signal at the wrist. The incision was irrigated and hemostasis acheived. The deeper tissue was closed with 3-0 Vicryl and the skin closed with 4-0 Monocryl.   Dermabond was applied the incisions and the arm was ACE wrapped. The patient was transferred to PACU in stable condition.    COMPLICATIONS: none apparent  CONDITION: stable  Norman GORMAN Serve MD [facility] Phone Number: [phone] 12/13/2023 9:11 AM

## 2023-12-13 NOTE — Anesthesia Procedure Notes (Addendum)
 Procedure Name: Intubation Date/Time: 12/13/2023 7:41 AM  Performed by: Christopher Comings, CRNAPre-anesthesia Checklist: Patient identified, Emergency Drugs available, Suction available and Patient being monitored Patient Re-evaluated:Patient Re-evaluated prior to induction Oxygen Delivery Method: Circle system utilized Preoxygenation: Pre-oxygenation with 100% oxygen Induction Type: IV induction Ventilation: Mask ventilation without difficulty and Oral airway inserted - appropriate to patient size Laryngoscope Size: Mac and 4 Grade View: Grade II Tube type: Oral Tube size: 7.0 mm Number of attempts: 1 Airway Equipment and Method: Stylet and Oral airway Placement Confirmation: ETT inserted through vocal cords under direct vision, positive ETCO2 and breath sounds checked- equal and bilateral Secured at: 22 cm Tube secured with: Tape Dental Injury: Teeth and Oropharynx as per pre-operative assessment

## 2023-12-14 ENCOUNTER — Encounter (HOSPITAL_COMMUNITY): Payer: Self-pay | Admitting: Vascular Surgery

## 2023-12-14 DIAGNOSIS — N2581 Secondary hyperparathyroidism of renal origin: Secondary | ICD-10-CM | POA: Diagnosis not present

## 2023-12-14 DIAGNOSIS — D689 Coagulation defect, unspecified: Secondary | ICD-10-CM | POA: Diagnosis not present

## 2023-12-14 DIAGNOSIS — N186 End stage renal disease: Secondary | ICD-10-CM | POA: Diagnosis not present

## 2023-12-14 DIAGNOSIS — Z992 Dependence on renal dialysis: Secondary | ICD-10-CM | POA: Diagnosis not present

## 2023-12-14 DIAGNOSIS — T8249XA Other complication of vascular dialysis catheter, initial encounter: Secondary | ICD-10-CM | POA: Diagnosis not present

## 2023-12-14 DIAGNOSIS — D631 Anemia in chronic kidney disease: Secondary | ICD-10-CM | POA: Diagnosis not present

## 2023-12-14 DIAGNOSIS — L299 Pruritus, unspecified: Secondary | ICD-10-CM | POA: Diagnosis not present

## 2023-12-17 DIAGNOSIS — L299 Pruritus, unspecified: Secondary | ICD-10-CM | POA: Diagnosis not present

## 2023-12-17 DIAGNOSIS — T8249XA Other complication of vascular dialysis catheter, initial encounter: Secondary | ICD-10-CM | POA: Diagnosis not present

## 2023-12-17 DIAGNOSIS — N186 End stage renal disease: Secondary | ICD-10-CM | POA: Diagnosis not present

## 2023-12-17 DIAGNOSIS — D631 Anemia in chronic kidney disease: Secondary | ICD-10-CM | POA: Diagnosis not present

## 2023-12-17 DIAGNOSIS — Z992 Dependence on renal dialysis: Secondary | ICD-10-CM | POA: Diagnosis not present

## 2023-12-17 DIAGNOSIS — D689 Coagulation defect, unspecified: Secondary | ICD-10-CM | POA: Diagnosis not present

## 2023-12-17 DIAGNOSIS — N2581 Secondary hyperparathyroidism of renal origin: Secondary | ICD-10-CM | POA: Diagnosis not present

## 2023-12-19 DIAGNOSIS — N186 End stage renal disease: Secondary | ICD-10-CM | POA: Diagnosis not present

## 2023-12-19 DIAGNOSIS — T8249XA Other complication of vascular dialysis catheter, initial encounter: Secondary | ICD-10-CM | POA: Diagnosis not present

## 2023-12-19 DIAGNOSIS — D631 Anemia in chronic kidney disease: Secondary | ICD-10-CM | POA: Diagnosis not present

## 2023-12-19 DIAGNOSIS — N2581 Secondary hyperparathyroidism of renal origin: Secondary | ICD-10-CM | POA: Diagnosis not present

## 2023-12-19 DIAGNOSIS — D689 Coagulation defect, unspecified: Secondary | ICD-10-CM | POA: Diagnosis not present

## 2023-12-19 DIAGNOSIS — L299 Pruritus, unspecified: Secondary | ICD-10-CM | POA: Diagnosis not present

## 2023-12-19 DIAGNOSIS — Z992 Dependence on renal dialysis: Secondary | ICD-10-CM | POA: Diagnosis not present

## 2023-12-21 DIAGNOSIS — L299 Pruritus, unspecified: Secondary | ICD-10-CM | POA: Diagnosis not present

## 2023-12-21 DIAGNOSIS — D689 Coagulation defect, unspecified: Secondary | ICD-10-CM | POA: Diagnosis not present

## 2023-12-21 DIAGNOSIS — D631 Anemia in chronic kidney disease: Secondary | ICD-10-CM | POA: Diagnosis not present

## 2023-12-21 DIAGNOSIS — Z992 Dependence on renal dialysis: Secondary | ICD-10-CM | POA: Diagnosis not present

## 2023-12-21 DIAGNOSIS — T8249XA Other complication of vascular dialysis catheter, initial encounter: Secondary | ICD-10-CM | POA: Diagnosis not present

## 2023-12-21 DIAGNOSIS — N2581 Secondary hyperparathyroidism of renal origin: Secondary | ICD-10-CM | POA: Diagnosis not present

## 2023-12-21 DIAGNOSIS — N186 End stage renal disease: Secondary | ICD-10-CM | POA: Diagnosis not present

## 2023-12-24 DIAGNOSIS — D631 Anemia in chronic kidney disease: Secondary | ICD-10-CM | POA: Diagnosis not present

## 2023-12-24 DIAGNOSIS — T8249XA Other complication of vascular dialysis catheter, initial encounter: Secondary | ICD-10-CM | POA: Diagnosis not present

## 2023-12-24 DIAGNOSIS — N186 End stage renal disease: Secondary | ICD-10-CM | POA: Diagnosis not present

## 2023-12-24 DIAGNOSIS — D689 Coagulation defect, unspecified: Secondary | ICD-10-CM | POA: Diagnosis not present

## 2023-12-24 DIAGNOSIS — N2581 Secondary hyperparathyroidism of renal origin: Secondary | ICD-10-CM | POA: Diagnosis not present

## 2023-12-24 DIAGNOSIS — Z992 Dependence on renal dialysis: Secondary | ICD-10-CM | POA: Diagnosis not present

## 2023-12-24 DIAGNOSIS — L299 Pruritus, unspecified: Secondary | ICD-10-CM | POA: Diagnosis not present

## 2023-12-26 DIAGNOSIS — D689 Coagulation defect, unspecified: Secondary | ICD-10-CM | POA: Diagnosis not present

## 2023-12-26 DIAGNOSIS — T8249XA Other complication of vascular dialysis catheter, initial encounter: Secondary | ICD-10-CM | POA: Diagnosis not present

## 2023-12-26 DIAGNOSIS — N186 End stage renal disease: Secondary | ICD-10-CM | POA: Diagnosis not present

## 2023-12-26 DIAGNOSIS — N2581 Secondary hyperparathyroidism of renal origin: Secondary | ICD-10-CM | POA: Diagnosis not present

## 2023-12-26 DIAGNOSIS — Z992 Dependence on renal dialysis: Secondary | ICD-10-CM | POA: Diagnosis not present

## 2023-12-26 DIAGNOSIS — D631 Anemia in chronic kidney disease: Secondary | ICD-10-CM | POA: Diagnosis not present

## 2023-12-26 DIAGNOSIS — L299 Pruritus, unspecified: Secondary | ICD-10-CM | POA: Diagnosis not present

## 2023-12-28 DIAGNOSIS — T8249XA Other complication of vascular dialysis catheter, initial encounter: Secondary | ICD-10-CM | POA: Diagnosis not present

## 2023-12-28 DIAGNOSIS — N186 End stage renal disease: Secondary | ICD-10-CM | POA: Diagnosis not present

## 2023-12-28 DIAGNOSIS — N2581 Secondary hyperparathyroidism of renal origin: Secondary | ICD-10-CM | POA: Diagnosis not present

## 2023-12-28 DIAGNOSIS — Z992 Dependence on renal dialysis: Secondary | ICD-10-CM | POA: Diagnosis not present

## 2023-12-28 DIAGNOSIS — L299 Pruritus, unspecified: Secondary | ICD-10-CM | POA: Diagnosis not present

## 2023-12-28 DIAGNOSIS — D631 Anemia in chronic kidney disease: Secondary | ICD-10-CM | POA: Diagnosis not present

## 2023-12-28 DIAGNOSIS — D689 Coagulation defect, unspecified: Secondary | ICD-10-CM | POA: Diagnosis not present

## 2023-12-31 DIAGNOSIS — D631 Anemia in chronic kidney disease: Secondary | ICD-10-CM | POA: Diagnosis not present

## 2023-12-31 DIAGNOSIS — Z992 Dependence on renal dialysis: Secondary | ICD-10-CM | POA: Diagnosis not present

## 2023-12-31 DIAGNOSIS — D689 Coagulation defect, unspecified: Secondary | ICD-10-CM | POA: Diagnosis not present

## 2023-12-31 DIAGNOSIS — L299 Pruritus, unspecified: Secondary | ICD-10-CM | POA: Diagnosis not present

## 2023-12-31 DIAGNOSIS — T8249XA Other complication of vascular dialysis catheter, initial encounter: Secondary | ICD-10-CM | POA: Diagnosis not present

## 2023-12-31 DIAGNOSIS — N2581 Secondary hyperparathyroidism of renal origin: Secondary | ICD-10-CM | POA: Diagnosis not present

## 2023-12-31 DIAGNOSIS — N186 End stage renal disease: Secondary | ICD-10-CM | POA: Diagnosis not present

## 2024-01-01 DIAGNOSIS — M1712 Unilateral primary osteoarthritis, left knee: Secondary | ICD-10-CM | POA: Diagnosis not present

## 2024-01-01 DIAGNOSIS — R262 Difficulty in walking, not elsewhere classified: Secondary | ICD-10-CM | POA: Diagnosis not present

## 2024-01-01 DIAGNOSIS — M25562 Pain in left knee: Secondary | ICD-10-CM | POA: Diagnosis not present

## 2024-01-01 DIAGNOSIS — M25662 Stiffness of left knee, not elsewhere classified: Secondary | ICD-10-CM | POA: Diagnosis not present

## 2024-01-02 DIAGNOSIS — Z992 Dependence on renal dialysis: Secondary | ICD-10-CM | POA: Diagnosis not present

## 2024-01-02 DIAGNOSIS — N186 End stage renal disease: Secondary | ICD-10-CM | POA: Diagnosis not present

## 2024-01-02 DIAGNOSIS — D631 Anemia in chronic kidney disease: Secondary | ICD-10-CM | POA: Diagnosis not present

## 2024-01-02 DIAGNOSIS — D689 Coagulation defect, unspecified: Secondary | ICD-10-CM | POA: Diagnosis not present

## 2024-01-02 DIAGNOSIS — L299 Pruritus, unspecified: Secondary | ICD-10-CM | POA: Diagnosis not present

## 2024-01-02 DIAGNOSIS — N2581 Secondary hyperparathyroidism of renal origin: Secondary | ICD-10-CM | POA: Diagnosis not present

## 2024-01-02 DIAGNOSIS — T8249XA Other complication of vascular dialysis catheter, initial encounter: Secondary | ICD-10-CM | POA: Diagnosis not present

## 2024-01-04 DIAGNOSIS — D689 Coagulation defect, unspecified: Secondary | ICD-10-CM | POA: Diagnosis not present

## 2024-01-04 DIAGNOSIS — L299 Pruritus, unspecified: Secondary | ICD-10-CM | POA: Diagnosis not present

## 2024-01-04 DIAGNOSIS — N186 End stage renal disease: Secondary | ICD-10-CM | POA: Diagnosis not present

## 2024-01-04 DIAGNOSIS — D631 Anemia in chronic kidney disease: Secondary | ICD-10-CM | POA: Diagnosis not present

## 2024-01-04 DIAGNOSIS — T8249XA Other complication of vascular dialysis catheter, initial encounter: Secondary | ICD-10-CM | POA: Diagnosis not present

## 2024-01-04 DIAGNOSIS — N2581 Secondary hyperparathyroidism of renal origin: Secondary | ICD-10-CM | POA: Diagnosis not present

## 2024-01-04 DIAGNOSIS — Z992 Dependence on renal dialysis: Secondary | ICD-10-CM | POA: Diagnosis not present

## 2024-01-04 DIAGNOSIS — I129 Hypertensive chronic kidney disease with stage 1 through stage 4 chronic kidney disease, or unspecified chronic kidney disease: Secondary | ICD-10-CM | POA: Diagnosis not present

## 2024-01-07 DIAGNOSIS — E8770 Fluid overload, unspecified: Secondary | ICD-10-CM | POA: Diagnosis not present

## 2024-01-07 DIAGNOSIS — Z992 Dependence on renal dialysis: Secondary | ICD-10-CM | POA: Diagnosis not present

## 2024-01-07 DIAGNOSIS — N186 End stage renal disease: Secondary | ICD-10-CM | POA: Diagnosis not present

## 2024-01-07 DIAGNOSIS — N2581 Secondary hyperparathyroidism of renal origin: Secondary | ICD-10-CM | POA: Diagnosis not present

## 2024-01-07 DIAGNOSIS — D631 Anemia in chronic kidney disease: Secondary | ICD-10-CM | POA: Diagnosis not present

## 2024-01-07 DIAGNOSIS — D689 Coagulation defect, unspecified: Secondary | ICD-10-CM | POA: Diagnosis not present

## 2024-01-07 DIAGNOSIS — L299 Pruritus, unspecified: Secondary | ICD-10-CM | POA: Diagnosis not present

## 2024-01-07 DIAGNOSIS — T8249XA Other complication of vascular dialysis catheter, initial encounter: Secondary | ICD-10-CM | POA: Diagnosis not present

## 2024-01-08 DIAGNOSIS — M25662 Stiffness of left knee, not elsewhere classified: Secondary | ICD-10-CM | POA: Diagnosis not present

## 2024-01-08 DIAGNOSIS — R262 Difficulty in walking, not elsewhere classified: Secondary | ICD-10-CM | POA: Diagnosis not present

## 2024-01-08 DIAGNOSIS — M1712 Unilateral primary osteoarthritis, left knee: Secondary | ICD-10-CM | POA: Diagnosis not present

## 2024-01-08 DIAGNOSIS — M25562 Pain in left knee: Secondary | ICD-10-CM | POA: Diagnosis not present

## 2024-01-09 DIAGNOSIS — N186 End stage renal disease: Secondary | ICD-10-CM | POA: Diagnosis not present

## 2024-01-09 DIAGNOSIS — E8770 Fluid overload, unspecified: Secondary | ICD-10-CM | POA: Diagnosis not present

## 2024-01-09 DIAGNOSIS — D631 Anemia in chronic kidney disease: Secondary | ICD-10-CM | POA: Diagnosis not present

## 2024-01-09 DIAGNOSIS — Z992 Dependence on renal dialysis: Secondary | ICD-10-CM | POA: Diagnosis not present

## 2024-01-09 DIAGNOSIS — T8249XA Other complication of vascular dialysis catheter, initial encounter: Secondary | ICD-10-CM | POA: Diagnosis not present

## 2024-01-09 DIAGNOSIS — N2581 Secondary hyperparathyroidism of renal origin: Secondary | ICD-10-CM | POA: Diagnosis not present

## 2024-01-09 DIAGNOSIS — D689 Coagulation defect, unspecified: Secondary | ICD-10-CM | POA: Diagnosis not present

## 2024-01-09 DIAGNOSIS — L299 Pruritus, unspecified: Secondary | ICD-10-CM | POA: Diagnosis not present

## 2024-01-11 DIAGNOSIS — D689 Coagulation defect, unspecified: Secondary | ICD-10-CM | POA: Diagnosis not present

## 2024-01-11 DIAGNOSIS — D631 Anemia in chronic kidney disease: Secondary | ICD-10-CM | POA: Diagnosis not present

## 2024-01-11 DIAGNOSIS — Z992 Dependence on renal dialysis: Secondary | ICD-10-CM | POA: Diagnosis not present

## 2024-01-11 DIAGNOSIS — T8249XA Other complication of vascular dialysis catheter, initial encounter: Secondary | ICD-10-CM | POA: Diagnosis not present

## 2024-01-11 DIAGNOSIS — N2581 Secondary hyperparathyroidism of renal origin: Secondary | ICD-10-CM | POA: Diagnosis not present

## 2024-01-11 DIAGNOSIS — L299 Pruritus, unspecified: Secondary | ICD-10-CM | POA: Diagnosis not present

## 2024-01-11 DIAGNOSIS — N186 End stage renal disease: Secondary | ICD-10-CM | POA: Diagnosis not present

## 2024-01-11 DIAGNOSIS — E8770 Fluid overload, unspecified: Secondary | ICD-10-CM | POA: Diagnosis not present

## 2024-01-14 DIAGNOSIS — N2581 Secondary hyperparathyroidism of renal origin: Secondary | ICD-10-CM | POA: Diagnosis not present

## 2024-01-14 DIAGNOSIS — Z992 Dependence on renal dialysis: Secondary | ICD-10-CM | POA: Diagnosis not present

## 2024-01-14 DIAGNOSIS — E8770 Fluid overload, unspecified: Secondary | ICD-10-CM | POA: Diagnosis not present

## 2024-01-14 DIAGNOSIS — D689 Coagulation defect, unspecified: Secondary | ICD-10-CM | POA: Diagnosis not present

## 2024-01-14 DIAGNOSIS — D631 Anemia in chronic kidney disease: Secondary | ICD-10-CM | POA: Diagnosis not present

## 2024-01-14 DIAGNOSIS — T8249XA Other complication of vascular dialysis catheter, initial encounter: Secondary | ICD-10-CM | POA: Diagnosis not present

## 2024-01-14 DIAGNOSIS — L299 Pruritus, unspecified: Secondary | ICD-10-CM | POA: Diagnosis not present

## 2024-01-14 DIAGNOSIS — N186 End stage renal disease: Secondary | ICD-10-CM | POA: Diagnosis not present

## 2024-01-15 DIAGNOSIS — R262 Difficulty in walking, not elsewhere classified: Secondary | ICD-10-CM | POA: Diagnosis not present

## 2024-01-15 DIAGNOSIS — M1712 Unilateral primary osteoarthritis, left knee: Secondary | ICD-10-CM | POA: Diagnosis not present

## 2024-01-15 DIAGNOSIS — M25562 Pain in left knee: Secondary | ICD-10-CM | POA: Diagnosis not present

## 2024-01-15 DIAGNOSIS — M25662 Stiffness of left knee, not elsewhere classified: Secondary | ICD-10-CM | POA: Diagnosis not present

## 2024-01-16 ENCOUNTER — Other Ambulatory Visit: Payer: Self-pay

## 2024-01-16 ENCOUNTER — Emergency Department (HOSPITAL_COMMUNITY)
Admission: EM | Admit: 2024-01-16 | Discharge: 2024-01-17 | Disposition: A | Discharge status: Home / Self Care | Attending: Emergency Medicine | Admitting: Emergency Medicine

## 2024-01-16 ENCOUNTER — Encounter (HOSPITAL_COMMUNITY): Payer: Self-pay | Admitting: *Deleted

## 2024-01-16 ENCOUNTER — Emergency Department (HOSPITAL_COMMUNITY)

## 2024-01-16 DIAGNOSIS — D631 Anemia in chronic kidney disease: Secondary | ICD-10-CM | POA: Diagnosis not present

## 2024-01-16 DIAGNOSIS — D689 Coagulation defect, unspecified: Secondary | ICD-10-CM | POA: Diagnosis not present

## 2024-01-16 DIAGNOSIS — E8770 Fluid overload, unspecified: Secondary | ICD-10-CM | POA: Diagnosis not present

## 2024-01-16 DIAGNOSIS — I120 Hypertensive chronic kidney disease with stage 5 chronic kidney disease or end stage renal disease: Secondary | ICD-10-CM | POA: Diagnosis not present

## 2024-01-16 DIAGNOSIS — I739 Peripheral vascular disease, unspecified: Secondary | ICD-10-CM | POA: Diagnosis not present

## 2024-01-16 DIAGNOSIS — T8249XA Other complication of vascular dialysis catheter, initial encounter: Secondary | ICD-10-CM | POA: Diagnosis not present

## 2024-01-16 DIAGNOSIS — Z96652 Presence of left artificial knee joint: Secondary | ICD-10-CM | POA: Diagnosis not present

## 2024-01-16 DIAGNOSIS — M25562 Pain in left knee: Secondary | ICD-10-CM | POA: Diagnosis not present

## 2024-01-16 DIAGNOSIS — B351 Tinea unguium: Secondary | ICD-10-CM | POA: Diagnosis not present

## 2024-01-16 DIAGNOSIS — N186 End stage renal disease: Secondary | ICD-10-CM | POA: Diagnosis not present

## 2024-01-16 DIAGNOSIS — Z992 Dependence on renal dialysis: Secondary | ICD-10-CM | POA: Diagnosis not present

## 2024-01-16 DIAGNOSIS — N2581 Secondary hyperparathyroidism of renal origin: Secondary | ICD-10-CM | POA: Diagnosis not present

## 2024-01-16 DIAGNOSIS — L299 Pruritus, unspecified: Secondary | ICD-10-CM | POA: Diagnosis not present

## 2024-01-16 DIAGNOSIS — M1712 Unilateral primary osteoarthritis, left knee: Secondary | ICD-10-CM | POA: Diagnosis not present

## 2024-01-16 NOTE — ED Triage Notes (Signed)
 [facility] lt knee yesterday and there was increased pain since 1430  her injection was on the lt side of her knee but the pain is on the other side of the knee that was iinjected  the pt is a dialysis pt  diakyzed earlier today

## 2024-01-16 NOTE — ED Provider Triage Note (Signed)
 Emergency Medicine Provider Triage Evaluation Note  Christina Vasquez , a 68 y.o. female  was evaluated in triage.  Pt complains of knee pain.  Review of Systems  Positive:  Negative:   Physical Exam  BP (!) 150/89 (BP Location: Left Arm)   Pulse (!) 104   Temp 98.4 F (36.9 C) (Oral)   Resp (!) 21   Ht 5\' 2"  (1.575 m)   Wt 100.2 kg   SpO2 95%   BMI 40.40 kg/m  Gen:   Awake, no distress   Resp:  Normal effort  MSK:   Moves extremities without difficulty  Other:    Medical Decision Making  Medically screening exam initiated at 9:07 PM.  Appropriate orders placed.  Christina Vasquez was informed that the remainder of the evaluation will be completed by another provider, this initial triage assessment does not replace that evaluation, and the importance of remaining in the ED until their evaluation is complete.  Steroid injection in knee yesterday. Increased pain started today. No other infectious symptoms. Knee is not erythematous or with increased warmth.   Dorthy Cooler, [place] 01/16/24 2107

## 2024-01-17 MED ORDER — SENNOSIDES-DOCUSATE SODIUM 8.6-50 MG PO TABS: 1.0000 | ORAL_TABLET | Freq: Every evening | ORAL | 0 refills | Status: DC | PRN

## 2024-01-17 MED ORDER — OXYCODONE-ACETAMINOPHEN 5-325 MG PO TABS: 1.0000 | ORAL_TABLET | Freq: Four times a day (QID) | ORAL | 0 refills | Status: DC | PRN

## 2024-01-17 NOTE — Discharge Instructions (Signed)
 Please follow with your orthopedic group in the morning. I have prescribed a stronger pain medication, Percocet. This will cause constipation so take the constipation medicine as well. It can cause drowsiness so do not take with alcohol, other sedating medication, and do not drive while on this medicine.

## 2024-01-17 NOTE — ED Provider Notes (Signed)
 Emergency Department Provider Note   I have reviewed the triage vital signs and the nursing notes.   HISTORY  Chief Complaint Knee Pain   HPI Christina Vasquez is a 68 y.o. female with chronic knee pain and ESRD presents to the emergency department with left knee pain which is worse after injection yesterday.  No erythema, warmth, fevers.  No new injury.  Patient states that she was due to have a knee replacement on the left but due to complications with her AV fistula had to delay surgery.  No falls.  No numbness.   Past Medical History:  Diagnosis Date   Anemia    Anxiety    Arthritis    Hyperparathyroidism (HCC)    Hypertension    Major depressive disorder 08/15/2018   PONV (postoperative nausea and vomiting)    Happened one time. Has had sx since then with no N/V   Renal disorder 12/19/2018   Stage 4 ESRD. HD-MWF   Sleep apnea    does not use CPAP    Review of Systems  Constitutional: No fever/chills Cardiovascular: Denies chest pain. Respiratory: Denies shortness of breath. Gastrointestinal: No abdominal pain. Musculoskeletal: Positive left knee pain.   ____________________________________________   PHYSICAL EXAM:  VITAL SIGNS: ED Triage Vitals  Encounter Vitals Group     BP 01/16/24 2048 (!) 150/89     Pulse Rate 01/16/24 2048 (!) 104     Resp 01/16/24 2048 (!) 21     Temp 01/16/24 2048 98.4 F (36.9 C)     Temp Source 01/16/24 2048 Oral     SpO2 01/16/24 2048 95 %     Weight 01/16/24 2100 220 lb 14.4 oz (100.2 kg)     Height 01/16/24 2100 5\' 2"  (1.575 m)   Constitutional: Alert and oriented. Well appearing and in no acute distress. Eyes: Conjunctivae are normal.  Head: Atraumatic. Nose: No congestion/rhinnorhea. Mouth/Throat: Mucous membranes are moist.   Neck: No stridor.   Cardiovascular: Normal rate, regular rhythm. Good peripheral circulation Respiratory: Normal respiratory effort.  Gastrointestinal: No distention.  Musculoskeletal:  Normal range of motion of the left knee.  No large effusion, erythema, warmth. Neurologic:  Normal speech and language.  Skin:  Skin is warm, dry and intact. No rash noted.  ____________________________________________  RADIOLOGY  DG Knee 2 Views Left Result Date: 01/16/2024 CLINICAL DATA:  Pain. EXAM: LEFT KNEE - 1-2 VIEW COMPARISON:  None Available. FINDINGS: No fracture or dislocation. 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 A complete joint space loss in the medial compartment with large tricompartmental osteophytes and subchondral cysts. No significant knee joint effusion. No erosive or bony destructive change. 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 Electronically Signed   By: Narda Rutherford M.D.   On: 01/16/2024 22:47    ____________________________________________   PROCEDURES  Procedure(s) performed:   Procedures  None  ____________________________________________   INITIAL IMPRESSION / ASSESSMENT AND PLAN / ED COURSE  Pertinent labs & imaging results that were available during my care of the patient were reviewed by me and considered in my medical decision making (see chart for details).   This patient is Presenting for Evaluation of knee pain, which does require a range of treatment options, and is a complaint that involves a moderate risk of morbidity and mortality.  The Differential Diagnoses include OA, septic joint, sprain, inflammatory arthritis, etc.  Radiologic Tests Ordered, included knee XR. I independently interpreted the images and agree with radiology interpretation.   Medical  Decision Making: Summary:  Patient presents emergency department with acute on chronic knee pain.  I do not see any evidence to strongly suggest septic joint.  X-ray shows severe arthritis.  Plan for continued supportive care, pain management at home, orthopedic follow-up.  Patient's presentation is most  consistent with acute presentation with potential threat to life or bodily function.   Disposition: discharge  ____________________________________________  FINAL CLINICAL IMPRESSION(S) / ED DIAGNOSES  Final diagnoses:  Acute pain of left knee     NEW OUTPATIENT MEDICATIONS STARTED DURING THIS VISIT:  Discharge Medication List as of 01/17/2024  1:13 AM     START taking these medications   Details  oxyCODONE-acetaminophen (PERCOCET/ROXICET) 5-325 MG tablet Take 1 tablet by mouth every 6 (six) hours as needed for severe pain (pain score 7-10)., Starting Thu 01/17/2024, Normal    senna-docusate (SENOKOT-S) 8.6-50 MG tablet Take 1 tablet by mouth at bedtime as needed for mild constipation., Starting Thu 01/17/2024, Normal        Note:  This document was prepared using Dragon voice recognition software and may include unintentional dictation errors.  Alona Bene, MD, [facility]    Jaquilla Woodroof, Arlyss Repress, MD 01/17/24 [phone]

## 2024-01-18 DIAGNOSIS — L299 Pruritus, unspecified: Secondary | ICD-10-CM | POA: Diagnosis not present

## 2024-01-18 DIAGNOSIS — N186 End stage renal disease: Secondary | ICD-10-CM | POA: Diagnosis not present

## 2024-01-18 DIAGNOSIS — D689 Coagulation defect, unspecified: Secondary | ICD-10-CM | POA: Diagnosis not present

## 2024-01-18 DIAGNOSIS — D631 Anemia in chronic kidney disease: Secondary | ICD-10-CM | POA: Diagnosis not present

## 2024-01-18 DIAGNOSIS — E8770 Fluid overload, unspecified: Secondary | ICD-10-CM | POA: Diagnosis not present

## 2024-01-18 DIAGNOSIS — N2581 Secondary hyperparathyroidism of renal origin: Secondary | ICD-10-CM | POA: Diagnosis not present

## 2024-01-18 DIAGNOSIS — Z992 Dependence on renal dialysis: Secondary | ICD-10-CM | POA: Diagnosis not present

## 2024-01-18 DIAGNOSIS — T8249XA Other complication of vascular dialysis catheter, initial encounter: Secondary | ICD-10-CM | POA: Diagnosis not present

## 2024-01-21 DIAGNOSIS — Z992 Dependence on renal dialysis: Secondary | ICD-10-CM | POA: Diagnosis not present

## 2024-01-21 DIAGNOSIS — L299 Pruritus, unspecified: Secondary | ICD-10-CM | POA: Diagnosis not present

## 2024-01-21 DIAGNOSIS — N186 End stage renal disease: Secondary | ICD-10-CM | POA: Diagnosis not present

## 2024-01-21 DIAGNOSIS — D689 Coagulation defect, unspecified: Secondary | ICD-10-CM | POA: Diagnosis not present

## 2024-01-21 DIAGNOSIS — T8249XA Other complication of vascular dialysis catheter, initial encounter: Secondary | ICD-10-CM | POA: Diagnosis not present

## 2024-01-21 DIAGNOSIS — E8770 Fluid overload, unspecified: Secondary | ICD-10-CM | POA: Diagnosis not present

## 2024-01-21 DIAGNOSIS — N2581 Secondary hyperparathyroidism of renal origin: Secondary | ICD-10-CM | POA: Diagnosis not present

## 2024-01-21 DIAGNOSIS — D631 Anemia in chronic kidney disease: Secondary | ICD-10-CM | POA: Diagnosis not present

## 2024-01-22 ENCOUNTER — Other Ambulatory Visit: Payer: Self-pay

## 2024-01-22 ENCOUNTER — Ambulatory Visit (HOSPITAL_COMMUNITY)
Admission: RE | Admit: 2024-01-22 | Discharge: 2024-01-22 | Disposition: A | Discharge status: Home / Self Care | Attending: Nephrology | Admitting: Nephrology

## 2024-01-22 ENCOUNTER — Encounter (HOSPITAL_COMMUNITY)
Admission: RE | Disposition: A | Discharge status: Home / Self Care | Payer: Self-pay | Source: Home / Self Care | Attending: Nephrology

## 2024-01-22 DIAGNOSIS — Z992 Dependence on renal dialysis: Secondary | ICD-10-CM | POA: Diagnosis not present

## 2024-01-22 DIAGNOSIS — F419 Anxiety disorder, unspecified: Secondary | ICD-10-CM | POA: Insufficient documentation

## 2024-01-22 DIAGNOSIS — E8770 Fluid overload, unspecified: Secondary | ICD-10-CM | POA: Diagnosis not present

## 2024-01-22 DIAGNOSIS — D689 Coagulation defect, unspecified: Secondary | ICD-10-CM | POA: Diagnosis not present

## 2024-01-22 DIAGNOSIS — G4730 Sleep apnea, unspecified: Secondary | ICD-10-CM | POA: Diagnosis not present

## 2024-01-22 DIAGNOSIS — I120 Hypertensive chronic kidney disease with stage 5 chronic kidney disease or end stage renal disease: Secondary | ICD-10-CM | POA: Diagnosis not present

## 2024-01-22 DIAGNOSIS — L299 Pruritus, unspecified: Secondary | ICD-10-CM | POA: Diagnosis not present

## 2024-01-22 DIAGNOSIS — Y839 Surgical procedure, unspecified as the cause of abnormal reaction of the patient, or of later complication, without mention of misadventure at the time of the procedure: Secondary | ICD-10-CM | POA: Diagnosis not present

## 2024-01-22 DIAGNOSIS — N186 End stage renal disease: Secondary | ICD-10-CM | POA: Insufficient documentation

## 2024-01-22 DIAGNOSIS — Z79899 Other long term (current) drug therapy: Secondary | ICD-10-CM | POA: Diagnosis not present

## 2024-01-22 DIAGNOSIS — D631 Anemia in chronic kidney disease: Secondary | ICD-10-CM | POA: Insufficient documentation

## 2024-01-22 DIAGNOSIS — T8249XA Other complication of vascular dialysis catheter, initial encounter: Secondary | ICD-10-CM | POA: Insufficient documentation

## 2024-01-22 DIAGNOSIS — N2581 Secondary hyperparathyroidism of renal origin: Secondary | ICD-10-CM | POA: Diagnosis not present

## 2024-01-22 HISTORY — PX: TUNNELLED CATHETER EXCHANGE: CATH118373

## 2024-01-22 SURGERY — TUNNELLED CATHETER EXCHANGE

## 2024-01-22 MED ORDER — LIDOCAINE HCL (PF) 1 % IJ SOLN
INTRAMUSCULAR | Status: AC
  Filled 2024-01-22: qty 30

## 2024-01-22 MED ORDER — SODIUM CHLORIDE 0.9 % IV SOLN: INTRAVENOUS | Status: DC

## 2024-01-22 MED ORDER — HEPARIN (PORCINE) IN NACL 1000-0.9 UT/500ML-% IV SOLN
INTRAVENOUS | Status: DC | PRN
  Administered 2024-01-22: 500 mL

## 2024-01-22 MED ORDER — LIDOCAINE HCL (PF) 1 % IJ SOLN
INTRAMUSCULAR | Status: DC | PRN
  Administered 2024-01-22: 10 mL via SUBCUTANEOUS

## 2024-01-22 MED ORDER — FENTANYL CITRATE (PF) 100 MCG/2ML IJ SOLN
INTRAMUSCULAR | Status: AC
  Filled 2024-01-22: qty 2

## 2024-01-22 MED ORDER — MIDAZOLAM HCL 2 MG/2ML IJ SOLN
INTRAMUSCULAR | Status: DC | PRN
  Administered 2024-01-22: 2 mg via INTRAVENOUS

## 2024-01-22 MED ORDER — HEPARIN SODIUM (PORCINE) 1000 UNIT/ML IJ SOLN
INTRAMUSCULAR | Status: AC
  Filled 2024-01-22: qty 10

## 2024-01-22 MED ORDER — HEPARIN SODIUM (PORCINE) 1000 UNIT/ML IJ SOLN
INTRAMUSCULAR | Status: DC | PRN
  Administered 2024-01-22: 3200 [IU] via INTRAVENOUS

## 2024-01-22 MED ORDER — MIDAZOLAM HCL 2 MG/2ML IJ SOLN
INTRAMUSCULAR | Status: AC
  Filled 2024-01-22: qty 2

## 2024-01-22 MED ORDER — IODIXANOL 320 MG/ML IV SOLN
INTRAVENOUS | Status: DC | PRN
  Administered 2024-01-22: 2 mL via INTRAVENOUS

## 2024-01-22 MED ORDER — FENTANYL CITRATE (PF) 100 MCG/2ML IJ SOLN
INTRAMUSCULAR | Status: DC | PRN
  Administered 2024-01-22: 25 ug via INTRAVENOUS

## 2024-01-22 SURGICAL SUPPLY — 5 items
BAG SNAP BAND KOVER 36X36 (MISCELLANEOUS) IMPLANT
CATH PALINDROME 19 SP (CATHETERS) IMPLANT
COVER DOME SNAP 22 D (MISCELLANEOUS) IMPLANT
GLIDEWIRE STIFF .35X180X3 HYDR (WIRE) IMPLANT
TRAY PV CATH (CUSTOM PROCEDURE TRAY) IMPLANT

## 2024-01-22 NOTE — H&P (Addendum)
 Chief Complaint: Nonfunctioning dialysis catheter HPI:  68 year old woman with past medical history significant for hypertension, anxiety, sleep apnea and end-stage renal disease on hemodialysis on MWF schedule.  Presents with poor function of her right IJ tunneled hemodialysis catheter that was placed in September 2024.  She denies any local or systemic signs of infection and does not have any constitutional complaints including fever or chills.  She denies any cough or shortness of breath.  The procedure was explained to the patient and she is willing to proceed with exchange of tunneled hemodialysis catheter after weighing the risks and benefits.  Past Medical History:  Diagnosis Date   Anemia    Anxiety    Arthritis    Hyperparathyroidism (HCC)    Hypertension    Major depressive disorder 08/15/2018   PONV (postoperative nausea and vomiting)    Happened one time. Has had sx since then with no N/V   Renal disorder 12/19/2018   Stage 4 ESRD. HD-MWF   Sleep apnea    does not use CPAP    Past Surgical History:  Procedure Laterality Date   A/V FISTULAGRAM  2019   ABDOMINAL HYSTERECTOMY  2005   AV FISTULA PLACEMENT Right 07/24/2023   Procedure: RIGHT UPPER EXTREMITY ARTERIOVENOUS GRAFT CREATION;  Surgeon: Chuck Hint, MD;  Location: [facility] OR;  Service: Vascular;  Laterality: Right;   AV FISTULA PLACEMENT Right 12/13/2023   Procedure: RIGHT ARTERIOVENOUS BRACHIOCEPHALIC FISTULA CREATION;  Surgeon: Daria Pastures, MD;  Location: [facility] OR;  Service: Vascular;  Laterality: Right;   AVGG REMOVAL Right 07/31/2023   Procedure: REMOVAL OF RIGHT ARM ARTERIOVENOUS GORETEX GRAFT (AVGG);  Surgeon: Daria Pastures, MD;  Location: [facility] OR;  Service: Vascular;  Laterality: Right;   BREAST EXCISIONAL BIOPSY Left 2017   scar not visible   CHOLECYSTECTOMY  2002   COLONOSCOPY  2010   COLONOSCOPY WITH PROPOFOL N/A 01/02/2023   Procedure: COLONOSCOPY WITH PROPOFOL;  Surgeon: Jeani Hawking, MD;   Location: [facility]COPY;  Service: Gastroenterology;  Laterality: N/A;   IR AV DIALY SHUNT INTRO NEEDLE/INTRAC INITIAL W/PTA/STENT/IMG LT  2021   Stent placed in Left Arm Fistula. Placed in [place]   LIGATION OF ARTERIOVENOUS  FISTULA Left 06/01/2023   Procedure: LIGATION OF LEFT ARM ARTERIOVENOUS  FISTULA WITH HEMATOMA EVACUATION;  Surgeon: Maeola Harman, MD;  Location: [facility] OR;  Service: Vascular;  Laterality: Left;   POLYPECTOMY  01/02/2023   Procedure: POLYPECTOMY;  Surgeon: Jeani Hawking, MD;  Location: [facility];  Service: Gastroenterology;;   REVERSE SHOULDER ARTHROPLASTY Right 06/01/2022   Procedure: REVERSE SHOULDER ARTHROPLASTY;  Surgeon: Bjorn Pippin, MD;  Location: [facility] OR;  Service: Orthopedics;  Laterality: Right;   REVERSE SHOULDER ARTHROPLASTY Left 09/21/2022   Procedure: REVERSE SHOULDER ARTHROPLASTY;  Surgeon: Bjorn Pippin, MD;  Location: [facility] OR;  Service: Orthopedics;  Laterality: Left;   TOTAL KNEE ARTHROPLASTY Right 2008   TUBAL LIGATION      Family History  Problem Relation Age of Onset   Breast cancer Sister    Breast cancer Cousin        maternal first cousin   Social History:  reports that she has never smoked. She has never used smokeless tobacco. She reports that she does not currently use alcohol. She reports that she does not use drugs.  Allergies:  Allergies  Allergen Reactions   Meloxicam Hives and Rash   Augmentin [Amoxicillin-Pot Clavulanate] Nausea And Vomiting   Tetracyclines & Related Hives    Medications Prior  to Admission  Medication Sig Dispense Refill   acetaminophen (TYLENOL) 650 MG CR tablet Take 1,300 mg by mouth every 8 (eight) hours as needed for pain.     amLODipine (NORVASC) 10 MG tablet Take 10 mg by mouth daily.     carvedilol (COREG) 25 MG tablet Take 1 tablet (25 mg total) by mouth 2 (two) times daily with a meal. Do not take on days before dialysis Do not take after dialysis if systolic blood pressure is less than 110.  60 tablet 2   cinacalcet (SENSIPAR) 30 MG tablet Take 30 mg by mouth See admin instructions. Take 30 mg daily except skip dose Sundays     gabapentin (NEURONTIN) 100 MG capsule Take 2 capsules (200 mg total) by mouth 3 (three) times daily. 180 capsule 5   hydrALAZINE (APRESOLINE) 25 MG tablet Take 25 mg by mouth 3 (three) times daily.     omeprazole (PRILOSEC) 40 MG capsule Take 40 mg by mouth daily.     sucroferric oxyhydroxide (VELPHORO) 500 MG chewable tablet Chew 1,000-1,500 mg by mouth See admin instructions. Take 1500 mg with each meal and 1000 mg with each snack     venlafaxine XR (EFFEXOR-XR) 37.5 MG 24 hr capsule Take 37.5 mg by mouth daily with breakfast.     VITAMIN D PO Take 5 capsules by mouth every Monday, Wednesday, and Friday with hemodialysis.     hydrALAZINE (APRESOLINE) 50 MG tablet Take 1 tablet (50 mg total) by mouth every 8 (eight) hours. Do not take on days before dialysis Do not take after dialysis if systolic blood pressure is less than 110. (Patient not taking: Reported on 12/07/2023) 90 tablet 2   Methoxy PEG-Epoetin Beta (MIRCERA IJ) Mircera      No results found for this or any previous visit (from the past 48 hours). No results found.  Review of Systems  All other systems reviewed and are negative.   Blood pressure 132/64, pulse 87, resp. rate 16, SpO2 100%. Physical Exam Vitals and nursing note reviewed.  Constitutional:      General: She is not in acute distress.    Appearance: Normal appearance. She is obese. She is not ill-appearing.  HENT:     Head: Normocephalic and atraumatic.     Right Ear: External ear normal.     Left Ear: External ear normal.     Mouth/Throat:     Pharynx: Oropharynx is clear.  Eyes:     Extraocular Movements: Extraocular movements intact.     Conjunctiva/sclera: Conjunctivae normal.  Cardiovascular:     Rate and Rhythm: Normal rate and regular rhythm.     Pulses: Normal pulses.     Heart sounds: Normal heart sounds.      Comments: Right IJ TDC Pulmonary:     Effort: Pulmonary effort is normal.     Breath sounds: Normal breath sounds.  Abdominal:     Palpations: Abdomen is soft.  Musculoskeletal:     Comments: Maturing right upper arm aVF  Neurological:     Mental Status: She is alert.      Assessment/Plan 1.  Nonfunctioning hemodialysis catheter: Will perform exchange of tunneled hemodialysis catheter over the wire and evaluate for additional etiologies for catheter dysfunction with angiogram. 2.  End-stage renal disease: Resume hemodialysis on MWF schedule following completion of procedure today.   3.  Hypertension: Blood pressure currently at goal, will continue to monitor with moderate sedation during procedure. 4.  Anemia of chronic disease: Resume ESA  at outpatient dialysis per protocol.  Dagoberto Ligas, MD 01/22/2024, 7:46 AM

## 2024-01-22 NOTE — Op Note (Signed)
 Patient presents with poor flows in her right IJ tunneled hemodialysis catheter (19 cm cuff to tip- Palindrome) that was placed at [facility] 6 months ago. On examination, aspiration from both ports is sluggish. Chest x-ray confirms the catheter tip is appropriately positioned in the RA. The catheter cuff is 1/2cm deep to the exit site.    Summary:  1) The patient had a successful 19 cm CTT Palindrome hemodialysis catheter exchange in the right internal jugular vein. 2) No sheath noted through either port. 3) Okay to use catheter immediately.  Description of procedure: The right neck, chest and the catheter were prepped and draped in the usual sterile fashion. The exit site and adjacent tunnel tract were anesthetized with lidocaine 1% with epinephrine. The cuff was dissected free with a curved Kelly and manual traction. The catheter was withdrawn and venogram through each of the ports was performed; there was no fibrin sheath evident through either port.   A hydrophilic wire was manipulated and advanced through the arterial port and the tip was parked in the IVC. The catheter was completely removed and noted to be entirely intact. A new 19 cm cuff to tip Palindrome catheter was inserted over the guidewire and the tip parked in the right atrium and at that point the cuff was approximately 1 cm deep to the exit site.   Aspiration and flushing of both limbs of the catheter confirmed excellent flow. No kinks were visible on fluoroscopic imaging. Both limbs of the catheter were locked with citrate and sterile caps were placed.   The hub was secured on to the chest wall with 2-0 nylon wing sutures.  Sterile dressings were placed, and the patient returned to recovery in stable condition.  Sedation: 2 mg Versed, 25 mcg Fentanyl Sedation time: 26 minutes  Contrast: 2 mL  Monitoring: Because of the patient's comorbid conditions and sedation during the procedure, continuous EKG monitoring and O2  saturation monitoring was performed throughout the procedure by the RN. There were no abnormal arrhythmias encountered.  Complications: None.  Diagnoses:   T82.49XA Other complication of vascular dialysis catheter (Poor flows) N18.6 End stage renal disease  Z99.2 Dialysis dependence  Procedures Coding:  36581 Tunneled catheter exchange 77001  Fluoroscopy guidance for catheter exchange. W2956 Contrast  Recommendations: Remove the suture in 3 weeks. 2.   Report any blood flow problems to [facility].  Discharge: The patient was discharged home in stable condition. The patient was given education regarding the care of the catheter and specific instructions in case of any problems.

## 2024-01-23 ENCOUNTER — Encounter (HOSPITAL_COMMUNITY): Payer: Self-pay | Admitting: Nephrology

## 2024-01-23 DIAGNOSIS — N2581 Secondary hyperparathyroidism of renal origin: Secondary | ICD-10-CM | POA: Diagnosis not present

## 2024-01-23 DIAGNOSIS — D689 Coagulation defect, unspecified: Secondary | ICD-10-CM | POA: Diagnosis not present

## 2024-01-23 DIAGNOSIS — D631 Anemia in chronic kidney disease: Secondary | ICD-10-CM | POA: Diagnosis not present

## 2024-01-23 DIAGNOSIS — L299 Pruritus, unspecified: Secondary | ICD-10-CM | POA: Diagnosis not present

## 2024-01-23 DIAGNOSIS — T8249XA Other complication of vascular dialysis catheter, initial encounter: Secondary | ICD-10-CM | POA: Diagnosis not present

## 2024-01-23 DIAGNOSIS — N186 End stage renal disease: Secondary | ICD-10-CM | POA: Diagnosis not present

## 2024-01-23 DIAGNOSIS — E8770 Fluid overload, unspecified: Secondary | ICD-10-CM | POA: Diagnosis not present

## 2024-01-23 DIAGNOSIS — Z992 Dependence on renal dialysis: Secondary | ICD-10-CM | POA: Diagnosis not present

## 2024-01-24 DIAGNOSIS — R262 Difficulty in walking, not elsewhere classified: Secondary | ICD-10-CM | POA: Diagnosis not present

## 2024-01-24 DIAGNOSIS — M25562 Pain in left knee: Secondary | ICD-10-CM | POA: Diagnosis not present

## 2024-01-24 DIAGNOSIS — M1712 Unilateral primary osteoarthritis, left knee: Secondary | ICD-10-CM | POA: Diagnosis not present

## 2024-01-24 DIAGNOSIS — M25662 Stiffness of left knee, not elsewhere classified: Secondary | ICD-10-CM | POA: Diagnosis not present

## 2024-01-25 ENCOUNTER — Ambulatory Visit (INDEPENDENT_AMBULATORY_CARE_PROVIDER_SITE_OTHER): Payer: Medicare Other | Admitting: Physician Assistant

## 2024-01-25 ENCOUNTER — Ambulatory Visit (HOSPITAL_COMMUNITY)
Admission: RE | Admit: 2024-01-25 | Discharge: 2024-01-25 | Disposition: A | Discharge status: Home / Self Care | Payer: Medicare Other | Source: Ambulatory Visit | Attending: Vascular Surgery | Admitting: Vascular Surgery

## 2024-01-25 ENCOUNTER — Encounter: Payer: Self-pay | Admitting: Physician Assistant

## 2024-01-25 VITALS — BP 142/76 | HR 84 | Temp 98.4°F | Ht 62.0 in | Wt 229.9 lb

## 2024-01-25 DIAGNOSIS — Z992 Dependence on renal dialysis: Secondary | ICD-10-CM | POA: Diagnosis not present

## 2024-01-25 DIAGNOSIS — T8249XA Other complication of vascular dialysis catheter, initial encounter: Secondary | ICD-10-CM | POA: Diagnosis not present

## 2024-01-25 DIAGNOSIS — N186 End stage renal disease: Secondary | ICD-10-CM | POA: Diagnosis not present

## 2024-01-25 DIAGNOSIS — N2581 Secondary hyperparathyroidism of renal origin: Secondary | ICD-10-CM | POA: Diagnosis not present

## 2024-01-25 DIAGNOSIS — E8770 Fluid overload, unspecified: Secondary | ICD-10-CM | POA: Diagnosis not present

## 2024-01-25 DIAGNOSIS — D631 Anemia in chronic kidney disease: Secondary | ICD-10-CM | POA: Diagnosis not present

## 2024-01-25 DIAGNOSIS — D689 Coagulation defect, unspecified: Secondary | ICD-10-CM | POA: Diagnosis not present

## 2024-01-25 DIAGNOSIS — L299 Pruritus, unspecified: Secondary | ICD-10-CM | POA: Diagnosis not present

## 2024-01-26 NOTE — H&P (View-Only) (Signed)
 POST OPERATIVE OFFICE NOTE    CC:  F/u for surgery  HPI:  Christina Vasquez is a 68 y.o. female who is here for postop check.  She recently underwent creation of first stage right brachiobasilic fistula by Dr. Hetty Blend on 12/13/2023.  She previously had a right upper arm graft placed on 07/24/2023 by Dr. Edilia Bo, however this had to be removed on 07/31/2023 by Dr. Hetty Blend for steal syndrome.  She returns today for follow-up.  She says she has been doing well since surgery.  She denies any symptoms of steal such as right hand weakness, numbness, pain, or excessive coldness.  She still has some baseline numbness/weakness in the right thumb due to neuropathy and her previous steal syndrome.  Her numbness/weakness has improved with hand therapy.  She states that she does have some surgical options available to potentially make her symptoms better in the future, however this is not her focus right now.  She denies any issues with her incision such as drainage, redness, or tenderness.  She dialyzes on MWF via TDC.   Allergies  Allergen Reactions   Meloxicam Hives and Rash   Augmentin [Amoxicillin-Pot Clavulanate] Nausea And Vomiting   Tetracyclines & Related Hives    Current Outpatient Medications  Medication Sig Dispense Refill   acetaminophen (TYLENOL) 650 MG CR tablet Take 1,300 mg by mouth every 8 (eight) hours as needed for pain.     amLODipine (NORVASC) 10 MG tablet Take 10 mg by mouth daily.     carvedilol (COREG) 25 MG tablet Take 1 tablet (25 mg total) by mouth 2 (two) times daily with a meal. Do not take on days before dialysis Do not take after dialysis if systolic blood pressure is less than 110. 60 tablet 2   cinacalcet (SENSIPAR) 30 MG tablet Take 30 mg by mouth See admin instructions. Take 30 mg daily except skip dose Sundays     gabapentin (NEURONTIN) 100 MG capsule Take 2 capsules (200 mg total) by mouth 3 (three) times daily. 180 capsule 5   hydrALAZINE (APRESOLINE) 25 MG tablet  Take 25 mg by mouth 3 (three) times daily.     hydrALAZINE (APRESOLINE) 50 MG tablet Take 1 tablet (50 mg total) by mouth every 8 (eight) hours. Do not take on days before dialysis Do not take after dialysis if systolic blood pressure is less than 110. (Patient not taking: Reported on 12/07/2023) 90 tablet 2   Methoxy PEG-Epoetin Beta (MIRCERA IJ) Mircera     omeprazole (PRILOSEC) 40 MG capsule Take 40 mg by mouth daily.     sucroferric oxyhydroxide (VELPHORO) 500 MG chewable tablet Chew 1,000-1,500 mg by mouth See admin instructions. Take 1500 mg with each meal and 1000 mg with each snack     venlafaxine XR (EFFEXOR-XR) 37.5 MG 24 hr capsule Take 37.5 mg by mouth daily with breakfast.     VITAMIN D PO Take 5 capsules by mouth every Monday, Wednesday, and Friday with hemodialysis.     No current facility-administered medications for this visit.     ROS:  See HPI  Physical Exam:  Incision: Right upper extremity incision healing appropriately without signs of infection or dehiscence Extremities: 2+ right radial pulse.  Right brachiobasilic fistula with great thrill in it throughout the upper arm Neuro: Intact motor and sensation of the right hand with slight thumb weakness  Studies: Dialysis Duplex: (01/25/2024) --+  AVF                PSV (  cm/s)Flow Vol (mL/min)Comments  +--------------------+----------+-----------------+--------+  Native artery inflow   186           940                 +--------------------+----------+-----------------+--------+  AVF Anastomosis        496                               +--------------------+----------+-----------------+--------+     +------------+---------+------------+----------+---------------------------  ----+  OUTFLOW VEIN   PSV     Diameter  Depth (cm)           Describe                            (cm/s)      (cm)                                                 +------------+---------+------------+----------+---------------------------  ----+  Prox UA        122                                                           +------------+---------+------------+----------+---------------------------  ----+  Mid UA         206                           at confluence with  brachial                                                            vein.                +------------+---------+------------+----------+---------------------------  ----+  Dist UA        176       0.55       2.03                                     +------------+---------+------------+----------+---------------------------  ----+  AC Fossa       424       0.50       1.34                                     +------------+---------+------------+----------+---------------------------  ----+     Assessment/Plan:  This is a 68 y.o. female who is here for postop check  -The patient recently underwent creation of right for stage brachiobasilic fistula.  This was done for permanent dialysis access.  She does have a history of right upper arm AV graft placement, however this was removed in September 2024 for steal syndrome -Her right upper extremity incision is well-healed without signs of infection or dehiscence -Duplex demonstrates a well maturing right brachiobasilic  fistula with great flow volume of 940.  Diameters in the arm range from 5 mm to 5.5 mm -She denies any symptoms of steal.  She does have some lingering right thumb weakness due to her previous steal syndrome and issues with neuropathy.  She has a 2+ right radial pulse -On exam her right brachiobasilic fistula has a great thrill in it -We will plan for right brachiobasilic fistula transposition and superficialization with Dr. Hetty Blend in the next couple of weeks.  This can be done on a nondialysis day.  The patient is agreeable.   Loel Dubonnet, PA-C [facility] [phone]   Call MD: Randie Heinz

## 2024-01-26 NOTE — Progress Notes (Signed)
 POST OPERATIVE OFFICE NOTE    CC:  F/u for surgery  HPI:  Christina Vasquez is a 68 y.o. female who is here for postop check.  She recently underwent creation of first stage right brachiobasilic fistula by Dr. Hetty Blend on 12/13/2023.  She previously had a right upper arm graft placed on 07/24/2023 by Dr. Edilia Bo, however this had to be removed on 07/31/2023 by Dr. Hetty Blend for steal syndrome.  She returns today for follow-up.  She says she has been doing well since surgery.  She denies any symptoms of steal such as right hand weakness, numbness, pain, or excessive coldness.  She still has some baseline numbness/weakness in the right thumb due to neuropathy and her previous steal syndrome.  Her numbness/weakness has improved with hand therapy.  She states that she does have some surgical options available to potentially make her symptoms better in the future, however this is not her focus right now.  She denies any issues with her incision such as drainage, redness, or tenderness.  She dialyzes on MWF via [facility].   Allergies  Allergen Reactions   Meloxicam Hives and Rash   Augmentin [Amoxicillin-Pot Clavulanate] Nausea And Vomiting   Tetracyclines & Related Hives    Current Outpatient Medications  Medication Sig Dispense Refill   acetaminophen (TYLENOL) 650 MG CR tablet Take 1,300 mg by mouth every 8 (eight) hours as needed for pain.     amLODipine (NORVASC) 10 MG tablet Take 10 mg by mouth daily.     carvedilol (COREG) 25 MG tablet Take 1 tablet (25 mg total) by mouth 2 (two) times daily with a meal. Do not take on days before dialysis Do not take after dialysis if systolic blood pressure is less than 110. 60 tablet 2   cinacalcet (SENSIPAR) 30 MG tablet Take 30 mg by mouth See admin instructions. Take 30 mg daily except skip dose Sundays     gabapentin (NEURONTIN) 100 MG capsule Take 2 capsules (200 mg total) by mouth 3 (three) times daily. 180 capsule 5   hydrALAZINE (APRESOLINE) 25 MG tablet  Take 25 mg by mouth 3 (three) times daily.     hydrALAZINE (APRESOLINE) 50 MG tablet Take 1 tablet (50 mg total) by mouth every 8 (eight) hours. Do not take on days before dialysis Do not take after dialysis if systolic blood pressure is less than 110. (Patient not taking: Reported on 12/07/2023) 90 tablet 2   Methoxy PEG-Epoetin Beta (MIRCERA IJ) Mircera     omeprazole (PRILOSEC) 40 MG capsule Take 40 mg by mouth daily.     sucroferric oxyhydroxide (VELPHORO) 500 MG chewable tablet Chew 1,000-1,500 mg by mouth See admin instructions. Take 1500 mg with each meal and 1000 mg with each snack     venlafaxine XR (EFFEXOR-XR) 37.5 MG 24 hr capsule Take 37.5 mg by mouth daily with breakfast.     VITAMIN D PO Take 5 capsules by mouth every Monday, Wednesday, and Friday with hemodialysis.     No current facility-administered medications for this visit.     ROS:  See HPI  Physical Exam:  Incision: Right upper extremity incision healing appropriately without signs of infection or dehiscence Extremities: 2+ right radial pulse.  Right brachiobasilic fistula with great thrill in it throughout the upper arm Neuro: Intact motor and sensation of the right hand with slight thumb weakness  Studies: Dialysis Duplex: (01/25/2024) --+  AVF                PSV (  cm/s)Flow Vol (mL/min)Comments  +--------------------+----------+-----------------+--------+  Native artery inflow   186           940                 +--------------------+----------+-----------------+--------+  AVF Anastomosis        496                               +--------------------+----------+-----------------+--------+     +------------+---------+------------+----------+---------------------------  ----+  OUTFLOW VEIN   PSV     Diameter  Depth (cm)           Describe                            (cm/s)      (cm)                                                 +------------+---------+------------+----------+---------------------------  ----+  Prox UA        122                                                           +------------+---------+------------+----------+---------------------------  ----+  Mid UA         206                           at confluence with  brachial                                                            vein.                +------------+---------+------------+----------+---------------------------  ----+  Dist UA        176       0.55       2.03                                     +------------+---------+------------+----------+---------------------------  ----+  AC Fossa       424       0.50       1.34                                     +------------+---------+------------+----------+---------------------------  ----+     Assessment/Plan:  This is a 68 y.o. female who is here for postop check  -The patient recently underwent creation of right for stage brachiobasilic fistula.  This was done for permanent dialysis access.  She does have a history of right upper arm AV graft placement, however this was removed in September 2024 for steal syndrome -Her right upper extremity incision is well-healed without signs of infection or dehiscence -Duplex demonstrates a well maturing right brachiobasilic  fistula with great flow volume of 940.  Diameters in the arm range from 5 mm to 5.5 mm -She denies any symptoms of steal.  She does have some lingering right thumb weakness due to her previous steal syndrome and issues with neuropathy.  She has a 2+ right radial pulse -On exam her right brachiobasilic fistula has a great thrill in it -We will plan for right brachiobasilic fistula transposition and superficialization with Dr. Hetty Blend in the next couple of weeks.  This can be done on a nondialysis day.  The patient is agreeable.   Loel Dubonnet, PA-C [facility] [phone]   Call MD: Randie Heinz

## 2024-01-28 ENCOUNTER — Other Ambulatory Visit: Payer: Self-pay

## 2024-01-28 DIAGNOSIS — N2581 Secondary hyperparathyroidism of renal origin: Secondary | ICD-10-CM | POA: Diagnosis not present

## 2024-01-28 DIAGNOSIS — N186 End stage renal disease: Secondary | ICD-10-CM

## 2024-01-28 DIAGNOSIS — D689 Coagulation defect, unspecified: Secondary | ICD-10-CM | POA: Diagnosis not present

## 2024-01-28 DIAGNOSIS — L299 Pruritus, unspecified: Secondary | ICD-10-CM | POA: Diagnosis not present

## 2024-01-28 DIAGNOSIS — D631 Anemia in chronic kidney disease: Secondary | ICD-10-CM | POA: Diagnosis not present

## 2024-01-28 DIAGNOSIS — Z992 Dependence on renal dialysis: Secondary | ICD-10-CM | POA: Diagnosis not present

## 2024-01-28 DIAGNOSIS — T8249XA Other complication of vascular dialysis catheter, initial encounter: Secondary | ICD-10-CM | POA: Diagnosis not present

## 2024-01-28 DIAGNOSIS — E8770 Fluid overload, unspecified: Secondary | ICD-10-CM | POA: Diagnosis not present

## 2024-01-30 DIAGNOSIS — N186 End stage renal disease: Secondary | ICD-10-CM | POA: Diagnosis not present

## 2024-01-30 DIAGNOSIS — T8249XA Other complication of vascular dialysis catheter, initial encounter: Secondary | ICD-10-CM | POA: Diagnosis not present

## 2024-01-30 DIAGNOSIS — E8770 Fluid overload, unspecified: Secondary | ICD-10-CM | POA: Diagnosis not present

## 2024-01-30 DIAGNOSIS — D689 Coagulation defect, unspecified: Secondary | ICD-10-CM | POA: Diagnosis not present

## 2024-01-30 DIAGNOSIS — N2581 Secondary hyperparathyroidism of renal origin: Secondary | ICD-10-CM | POA: Diagnosis not present

## 2024-01-30 DIAGNOSIS — Z992 Dependence on renal dialysis: Secondary | ICD-10-CM | POA: Diagnosis not present

## 2024-01-30 DIAGNOSIS — L299 Pruritus, unspecified: Secondary | ICD-10-CM | POA: Diagnosis not present

## 2024-01-30 DIAGNOSIS — D631 Anemia in chronic kidney disease: Secondary | ICD-10-CM | POA: Diagnosis not present

## 2024-02-01 DIAGNOSIS — D631 Anemia in chronic kidney disease: Secondary | ICD-10-CM | POA: Diagnosis not present

## 2024-02-01 DIAGNOSIS — N2581 Secondary hyperparathyroidism of renal origin: Secondary | ICD-10-CM | POA: Diagnosis not present

## 2024-02-01 DIAGNOSIS — T8249XA Other complication of vascular dialysis catheter, initial encounter: Secondary | ICD-10-CM | POA: Diagnosis not present

## 2024-02-01 DIAGNOSIS — L299 Pruritus, unspecified: Secondary | ICD-10-CM | POA: Diagnosis not present

## 2024-02-01 DIAGNOSIS — E8770 Fluid overload, unspecified: Secondary | ICD-10-CM | POA: Diagnosis not present

## 2024-02-01 DIAGNOSIS — N186 End stage renal disease: Secondary | ICD-10-CM | POA: Diagnosis not present

## 2024-02-01 DIAGNOSIS — Z992 Dependence on renal dialysis: Secondary | ICD-10-CM | POA: Diagnosis not present

## 2024-02-01 DIAGNOSIS — D689 Coagulation defect, unspecified: Secondary | ICD-10-CM | POA: Diagnosis not present

## 2024-02-04 ENCOUNTER — Other Ambulatory Visit: Payer: Self-pay

## 2024-02-04 ENCOUNTER — Encounter (HOSPITAL_COMMUNITY): Payer: Self-pay | Admitting: Vascular Surgery

## 2024-02-04 DIAGNOSIS — D689 Coagulation defect, unspecified: Secondary | ICD-10-CM | POA: Diagnosis not present

## 2024-02-04 DIAGNOSIS — I129 Hypertensive chronic kidney disease with stage 1 through stage 4 chronic kidney disease, or unspecified chronic kidney disease: Secondary | ICD-10-CM | POA: Diagnosis not present

## 2024-02-04 DIAGNOSIS — E8770 Fluid overload, unspecified: Secondary | ICD-10-CM | POA: Diagnosis not present

## 2024-02-04 DIAGNOSIS — N186 End stage renal disease: Secondary | ICD-10-CM | POA: Diagnosis not present

## 2024-02-04 DIAGNOSIS — Z992 Dependence on renal dialysis: Secondary | ICD-10-CM | POA: Diagnosis not present

## 2024-02-04 DIAGNOSIS — N2581 Secondary hyperparathyroidism of renal origin: Secondary | ICD-10-CM | POA: Diagnosis not present

## 2024-02-04 DIAGNOSIS — T8249XA Other complication of vascular dialysis catheter, initial encounter: Secondary | ICD-10-CM | POA: Diagnosis not present

## 2024-02-04 DIAGNOSIS — L299 Pruritus, unspecified: Secondary | ICD-10-CM | POA: Diagnosis not present

## 2024-02-04 DIAGNOSIS — D631 Anemia in chronic kidney disease: Secondary | ICD-10-CM | POA: Diagnosis not present

## 2024-02-04 NOTE — Progress Notes (Signed)
Spoke with the pt, she will arrive tom at 0800.

## 2024-02-04 NOTE — Progress Notes (Signed)
 SDW CALL  Patient was given pre-op instructions over the phone. The opportunity was given for the patient to ask questions. No further questions asked. Patient verbalized understanding of instructions given.   PCP - Patrica Duel Cardiologist - denies  PPM/ICD - denies Device Orders -  Rep Notified -   Chest x-ray - 09/25/23 [facility] EKG - 08/06/23 Stress Test - 11/15/23-[facility] ECHO - 11/15/23-[facility] Cardiac Cath - denies  Sleep Study - 2018 +[facility] CPAP - could not tolerate  Fasting Blood Sugar - na Checks Blood Sugar _____ times a day  Blood Thinner Instructions:na Aspirin Instructions:na  ERAS Protcol -no PRE-SURGERY Ensure or G2-   COVID TEST- na   Anesthesia review: no  Patient denies shortness of breath, fever, cough and chest pain over the phone call    Surgical Instructions    Your procedure is scheduled on April 1  Report to [facility] Main Entrance "A" at 0650 A.M., then check in with the Admitting office.  Call this number if you have problems the morning of surgery:  [phone]    Remember:  Do not eat or drink anything after midnight the night before your surgery   Take these medicines the morning of surgery with A SIP OF WATER: Amlodipine,Coreg,Gabapentin,hydralazine,prilosec,venlafaxine. PRN - tylenol   As of today, STOP taking any Aspirin (unless otherwise instructed by your surgeon) Aleve, Naproxen, Ibuprofen, Motrin, Advil, Goody's, BC's, all herbal medications, fish oil, and all vitamins.  [facility] is not responsible for any belongings or valuables. .   Do NOT Smoke (Tobacco/Vaping)  24 hours prior to your procedure  If you use a CPAP at night, you may bring your mask for your overnight stay.   Contacts, glasses, hearing aids, dentures or partials may not be worn into surgery, please bring cases for these belongings   Patients discharged the day of surgery will not be allowed to drive home, and someone needs to stay with them for 24  hours.   SURGICAL WAITING ROOM VISITATION You may have 1 visitor in the pre-op area at a time determined by the pre-op nurse. (Visitor may not switch out)   Please refer to the [employer] website for the visitor guidelines for Inpatients (after your surgery is over and you are in a regular room).     Special instructions:    Oral Hygiene is also important to reduce your risk of infection.  Remember - BRUSH YOUR TEETH THE MORNING OF SURGERY WITH YOUR REGULAR TOOTHPASTE   Day of Surgery:  Take a shower the day of or night before with antibacterial soap. Wear Clean/Comfortable clothing the morning of surgery Do not apply any deodorants/lotions.   Do not wear jewelry or makeup Do not wear lotions, powders, perfumes/colognes, or deodorant. Do not shave 48 hours prior to surgery.  Men may shave face and neck. Do not bring valuables to the hospital. Do not wear nail polish, gel polish, artificial nails, or any other type of covering on natural nails (fingers and toes) If you have artificial nails or gel coating that need to be removed by a nail salon, please have this removed prior to surgery. Artificial nails or gel coating may interfere with anesthesia's ability to adequately monitor your vital signs. Remember to brush your teeth WITH YOUR REGULAR TOOTHPASTE.

## 2024-02-05 ENCOUNTER — Other Ambulatory Visit: Payer: Self-pay

## 2024-02-05 ENCOUNTER — Ambulatory Visit (HOSPITAL_COMMUNITY): Admitting: Anesthesiology

## 2024-02-05 ENCOUNTER — Encounter (HOSPITAL_COMMUNITY): Payer: Self-pay | Admitting: Vascular Surgery

## 2024-02-05 ENCOUNTER — Encounter (HOSPITAL_COMMUNITY)
Admission: RE | Disposition: A | Discharge status: Home / Self Care | Payer: Self-pay | Source: Home / Self Care | Attending: Vascular Surgery

## 2024-02-05 ENCOUNTER — Ambulatory Visit (HOSPITAL_COMMUNITY)
Admission: RE | Admit: 2024-02-05 | Discharge: 2024-02-05 | Disposition: A | Discharge status: Home / Self Care | Attending: Vascular Surgery | Admitting: Vascular Surgery

## 2024-02-05 ENCOUNTER — Ambulatory Visit (HOSPITAL_BASED_OUTPATIENT_CLINIC_OR_DEPARTMENT_OTHER): Admitting: Anesthesiology

## 2024-02-05 DIAGNOSIS — I739 Peripheral vascular disease, unspecified: Secondary | ICD-10-CM

## 2024-02-05 DIAGNOSIS — N186 End stage renal disease: Secondary | ICD-10-CM

## 2024-02-05 DIAGNOSIS — I120 Hypertensive chronic kidney disease with stage 5 chronic kidney disease or end stage renal disease: Secondary | ICD-10-CM

## 2024-02-05 DIAGNOSIS — F32A Depression, unspecified: Secondary | ICD-10-CM | POA: Insufficient documentation

## 2024-02-05 DIAGNOSIS — E039 Hypothyroidism, unspecified: Secondary | ICD-10-CM | POA: Diagnosis not present

## 2024-02-05 DIAGNOSIS — K219 Gastro-esophageal reflux disease without esophagitis: Secondary | ICD-10-CM | POA: Diagnosis not present

## 2024-02-05 DIAGNOSIS — Z992 Dependence on renal dialysis: Secondary | ICD-10-CM

## 2024-02-05 DIAGNOSIS — G4730 Sleep apnea, unspecified: Secondary | ICD-10-CM | POA: Insufficient documentation

## 2024-02-05 DIAGNOSIS — F419 Anxiety disorder, unspecified: Secondary | ICD-10-CM | POA: Insufficient documentation

## 2024-02-05 DIAGNOSIS — Z79899 Other long term (current) drug therapy: Secondary | ICD-10-CM | POA: Insufficient documentation

## 2024-02-05 DIAGNOSIS — G4733 Obstructive sleep apnea (adult) (pediatric): Secondary | ICD-10-CM | POA: Diagnosis not present

## 2024-02-05 HISTORY — PX: BASCILIC VEIN TRANSPOSITION: SHX5742

## 2024-02-05 HISTORY — DX: Gastro-esophageal reflux disease without esophagitis: K21.9

## 2024-02-05 HISTORY — PX: FISTULA SUPERFICIALIZATION: SHX6341

## 2024-02-05 LAB — POCT I-STAT, CHEM 8
BUN: 39 mg/dL — ABNORMAL HIGH (ref 8–23)
Calcium, Ion: 1.11 mmol/L — ABNORMAL LOW (ref 1.15–1.40)
Chloride: 102 mmol/L (ref 98–111)
Creatinine, Ser: 7.1 mg/dL — ABNORMAL HIGH (ref 0.44–1.00)
Glucose, Bld: 85 mg/dL (ref 70–99)
HCT: 36 % (ref 36.0–46.0)
Hemoglobin: 12.2 g/dL (ref 12.0–15.0)
Potassium: 4.9 mmol/L (ref 3.5–5.1)
Sodium: 138 mmol/L (ref 135–145)
TCO2: 31 mmol/L (ref 22–32)

## 2024-02-05 SURGERY — TRANSPOSITION, VEIN, BASILIC
Anesthesia: General | Site: Arm Upper | Laterality: Right

## 2024-02-05 MED ORDER — HEMOSTATIC AGENTS (NO CHARGE) OPTIME
TOPICAL | Status: DC | PRN
  Administered 2024-02-05: 1

## 2024-02-05 MED ORDER — PHENYLEPHRINE 80 MCG/ML (10ML) SYRINGE FOR IV PUSH (FOR BLOOD PRESSURE SUPPORT)
PREFILLED_SYRINGE | INTRAVENOUS | Status: AC
  Filled 2024-02-05: qty 10

## 2024-02-05 MED ORDER — ACETAMINOPHEN 500 MG PO TABS
1000.0000 mg | ORAL_TABLET | Freq: Once | ORAL | Status: AC
  Administered 2024-02-05: 1000 mg via ORAL
  Filled 2024-02-05: qty 2

## 2024-02-05 MED ORDER — 0.9 % SODIUM CHLORIDE (POUR BTL) OPTIME
TOPICAL | Status: DC | PRN
  Administered 2024-02-05: 1000 mL

## 2024-02-05 MED ORDER — CHLORHEXIDINE GLUCONATE 0.12 % MT SOLN
OROMUCOSAL | Status: AC
  Administered 2024-02-05: 15 mL via OROMUCOSAL
  Filled 2024-02-05: qty 15

## 2024-02-05 MED ORDER — ONDANSETRON HCL 4 MG/2ML IJ SOLN
INTRAMUSCULAR | Status: DC | PRN
  Administered 2024-02-05: 4 mg via INTRAVENOUS

## 2024-02-05 MED ORDER — CHLORHEXIDINE GLUCONATE 4 % EX SOLN: 60.0000 mL | Freq: Once | CUTANEOUS | Status: DC

## 2024-02-05 MED ORDER — MIDAZOLAM HCL 2 MG/2ML IJ SOLN
INTRAMUSCULAR | Status: DC | PRN
Start: 2024-02-05 — End: 2024-02-05
  Administered 2024-02-05: 2 mg via INTRAVENOUS

## 2024-02-05 MED ORDER — PHENYLEPHRINE HCL-NACL 20-0.9 MG/250ML-% IV SOLN
INTRAVENOUS | Status: DC | PRN
  Administered 2024-02-05: 80 ug via INTRAVENOUS
  Administered 2024-02-05: 120 ug via INTRAVENOUS
  Administered 2024-02-05: 40 ug/min via INTRAVENOUS
  Administered 2024-02-05 (×2): 160 ug via INTRAVENOUS
  Administered 2024-02-05: 80 ug via INTRAVENOUS
  Administered 2024-02-05 (×2): 160 ug via INTRAVENOUS

## 2024-02-05 MED ORDER — ONDANSETRON HCL 4 MG/2ML IJ SOLN: 4.0000 mg | Freq: Once | INTRAMUSCULAR | Status: DC | PRN

## 2024-02-05 MED ORDER — DEXAMETHASONE SODIUM PHOSPHATE 10 MG/ML IJ SOLN
INTRAMUSCULAR | Status: DC | PRN
  Administered 2024-02-05: 5 mg via INTRAVENOUS

## 2024-02-05 MED ORDER — DEXAMETHASONE SODIUM PHOSPHATE 10 MG/ML IJ SOLN
INTRAMUSCULAR | Status: AC
  Filled 2024-02-05: qty 1

## 2024-02-05 MED ORDER — HEPARIN 6000 UNIT IRRIGATION SOLUTION
Status: DC | PRN
  Administered 2024-02-05: 1

## 2024-02-05 MED ORDER — FENTANYL CITRATE (PF) 250 MCG/5ML IJ SOLN
INTRAMUSCULAR | Status: DC | PRN
  Administered 2024-02-05 (×4): 25 ug via INTRAVENOUS

## 2024-02-05 MED ORDER — ORAL CARE MOUTH RINSE: 15.0000 mL | Freq: Once | OROMUCOSAL | Status: AC

## 2024-02-05 MED ORDER — HEPARIN SODIUM (PORCINE) 1000 UNIT/ML IJ SOLN
INTRAMUSCULAR | Status: AC
  Filled 2024-02-05: qty 10

## 2024-02-05 MED ORDER — PROPOFOL 10 MG/ML IV BOLUS
INTRAVENOUS | Status: AC
  Filled 2024-02-05: qty 20

## 2024-02-05 MED ORDER — FENTANYL CITRATE (PF) 250 MCG/5ML IJ SOLN
INTRAMUSCULAR | Status: AC
  Filled 2024-02-05: qty 5

## 2024-02-05 MED ORDER — SODIUM CHLORIDE 0.9 % IV SOLN: INTRAVENOUS | Status: DC | PRN

## 2024-02-05 MED ORDER — LIDOCAINE 2% (20 MG/ML) 5 ML SYRINGE
INTRAMUSCULAR | Status: AC
  Filled 2024-02-05: qty 5

## 2024-02-05 MED ORDER — FENTANYL CITRATE (PF) 100 MCG/2ML IJ SOLN
25.0000 ug | INTRAMUSCULAR | Status: DC | PRN
  Administered 2024-02-05: 25 ug via INTRAVENOUS

## 2024-02-05 MED ORDER — EPHEDRINE SULFATE-NACL 50-0.9 MG/10ML-% IV SOSY
PREFILLED_SYRINGE | INTRAVENOUS | Status: DC | PRN
  Administered 2024-02-05 (×4): 10 mg via INTRAVENOUS

## 2024-02-05 MED ORDER — MIDAZOLAM HCL 2 MG/2ML IJ SOLN
INTRAMUSCULAR | Status: AC
  Filled 2024-02-05: qty 2

## 2024-02-05 MED ORDER — VANCOMYCIN HCL 1500 MG/300ML IV SOLN
1500.0000 mg | INTRAVENOUS | Status: AC
  Administered 2024-02-05: 1500 mg via INTRAVENOUS
  Filled 2024-02-05: qty 300

## 2024-02-05 MED ORDER — FENTANYL CITRATE (PF) 100 MCG/2ML IJ SOLN
INTRAMUSCULAR | Status: AC
  Filled 2024-02-05: qty 2

## 2024-02-05 MED ORDER — ONDANSETRON HCL 4 MG/2ML IJ SOLN
INTRAMUSCULAR | Status: AC
  Filled 2024-02-05: qty 2

## 2024-02-05 MED ORDER — PHENYLEPHRINE HCL-NACL 20-0.9 MG/250ML-% IV SOLN
INTRAVENOUS | Status: AC
  Filled 2024-02-05: qty 250

## 2024-02-05 MED ORDER — OXYCODONE HCL 5 MG/5ML PO SOLN: 5.0000 mg | Freq: Once | ORAL | Status: DC | PRN

## 2024-02-05 MED ORDER — LIDOCAINE 2% (20 MG/ML) 5 ML SYRINGE
INTRAMUSCULAR | Status: DC | PRN
  Administered 2024-02-05: 60 mg via INTRAVENOUS

## 2024-02-05 MED ORDER — PROPOFOL 10 MG/ML IV BOLUS
INTRAVENOUS | Status: DC | PRN
  Administered 2024-02-05: 120 mg via INTRAVENOUS

## 2024-02-05 MED ORDER — OXYCODONE-ACETAMINOPHEN 5-325 MG PO TABS: 1.0000 | ORAL_TABLET | Freq: Four times a day (QID) | ORAL | 0 refills | Status: DC | PRN

## 2024-02-05 MED ORDER — CHLORHEXIDINE GLUCONATE 0.12 % MT SOLN: 15.0000 mL | Freq: Once | OROMUCOSAL | Status: AC

## 2024-02-05 MED ORDER — HEPARIN SODIUM (PORCINE) 1000 UNIT/ML IJ SOLN
INTRAMUSCULAR | Status: DC | PRN
  Administered 2024-02-05: 3000 [IU] via INTRAVENOUS

## 2024-02-05 MED ORDER — OXYCODONE HCL 5 MG PO TABS: 5.0000 mg | ORAL_TABLET | Freq: Once | ORAL | Status: DC | PRN

## 2024-02-05 MED ORDER — EPHEDRINE 5 MG/ML INJ
INTRAVENOUS | Status: AC
  Filled 2024-02-05: qty 5

## 2024-02-05 MED ORDER — HEPARIN 6000 UNIT IRRIGATION SOLUTION
Status: AC
  Filled 2024-02-05: qty 500

## 2024-02-05 MED ORDER — SODIUM CHLORIDE 0.9% FLUSH: 3.0000 mL | INTRAVENOUS | Status: DC | PRN

## 2024-02-05 SURGICAL SUPPLY — 31 items
ARMBAND PINK RESTRICT EXTREMIT (MISCELLANEOUS) ×1 IMPLANT
BAG COUNTER SPONGE SURGICOUNT (BAG) ×1 IMPLANT
CANISTER SUCT 3000ML PPV (MISCELLANEOUS) ×1 IMPLANT
CLIP TI MEDIUM 24 (CLIP) ×1 IMPLANT
CLIP TI MEDIUM 6 (CLIP) ×1 IMPLANT
CLIP TI WIDE RED SMALL 24 (CLIP) ×1 IMPLANT
CLIP TI WIDE RED SMALL 6 (CLIP) ×1 IMPLANT
COVER PROBE W GEL 5X96 (DRAPES) ×1 IMPLANT
DERMABOND ADVANCED .7 DNX12 (GAUZE/BANDAGES/DRESSINGS) ×1 IMPLANT
ELECT REM PT RETURN 9FT ADLT (ELECTROSURGICAL) ×1 IMPLANT
ELECTRODE REM PT RTRN 9FT ADLT (ELECTROSURGICAL) ×1 IMPLANT
GLOVE BIOGEL PI IND STRL 7.0 (GLOVE) ×1 IMPLANT
GOWN STRL REUS W/ TWL LRG LVL3 (GOWN DISPOSABLE) ×2 IMPLANT
GOWN STRL REUS W/ TWL XL LVL3 (GOWN DISPOSABLE) ×1 IMPLANT
HEMOSTAT SNOW SURGICEL 2X4 (HEMOSTASIS) IMPLANT
KIT BASIN OR (CUSTOM PROCEDURE TRAY) ×1 IMPLANT
KIT TURNOVER KIT B (KITS) ×1 IMPLANT
LOOP VESSEL MINI RED (MISCELLANEOUS) IMPLANT
NS IRRIG 1000ML POUR BTL (IV SOLUTION) ×1 IMPLANT
PACK CV ACCESS (CUSTOM PROCEDURE TRAY) ×1 IMPLANT
PAD ARMBOARD POSITIONER FOAM (MISCELLANEOUS) ×2 IMPLANT
POWDER SURGICEL 3.0 GRAM (HEMOSTASIS) IMPLANT
SLING ARM FOAM STRAP LRG (SOFTGOODS) IMPLANT
SLING ARM FOAM STRAP MED (SOFTGOODS) IMPLANT
SUT MNCRL AB 4-0 PS2 18 (SUTURE) ×1 IMPLANT
SUT PROLENE 6 0 BV (SUTURE) ×1 IMPLANT
SUT SILK 2 0 SH (SUTURE) IMPLANT
SUT VIC AB 3-0 SH 27X BRD (SUTURE) ×1 IMPLANT
TOWEL GREEN STERILE (TOWEL DISPOSABLE) ×1 IMPLANT
UNDERPAD 30X36 HEAVY ABSORB (UNDERPADS AND DIAPERS) ×1 IMPLANT
WATER STERILE IRR 1000ML POUR (IV SOLUTION) ×1 IMPLANT

## 2024-02-05 NOTE — Interval H&P Note (Signed)
 History and Physical Interval Note:  02/05/2024 9:25 AM  Christina Vasquez  has presented today for surgery, with the diagnosis of ESRD.  The various methods of treatment have been discussed with the patient and family. After consideration of risks, benefits and other options for treatment, the patient has consented to  Procedure(s): TRANSPOSITION, VEIN, BASILIC (Right) FISTULA SUPERFICIALIZATION (Right) as a surgical intervention.  The patient's history has been reviewed, patient examined, no change in status, stable for surgery.  I have reviewed the patient's chart and labs.  Questions were answered to the patient's satisfaction.     Christina Vasquez

## 2024-02-05 NOTE — Op Note (Signed)
 OPERATIVE NOTE  PROCEDURE:   Intraoperative right arm vein mapping right arm 2nd stage brachiobasilic AVF transposition  PRE-OPERATIVE DIAGNOSIS: ESRD  POST-OPERATIVE DIAGNOSIS: same as above   SURGEON: Daria Pastures MD  ASSISTANT(S): Doreatha Massed, PA  Given the complexity of the case,  the assistant was necessary in order to expedient the procedure and safely perform the technical aspects of the operation.  The assistant provided traction and countertraction to assist with exposure of the artery and vein.  They also assisted with suture ligation of multiple venous branches.  They played a critical role in the anastomosis. These skills, especially following the Prolene suture for the anastomosis, could not have been adequately performed by a scrub tech assistant.   ANESTHESIA: general  ESTIMATED BLOOD LOSS: 25 cc  FINDING(S): Appropriately dilated right brachiobasilic AVF Early confluence to the brachial vein in the mid upper arm Palpable and doppler thrill in AVF with multiphasic radial artery on completion  The outflow brachial vein for this basilic fistula was ligated in the proximal upper arm during previous AV graft placement.  Therefore the outflow is compromised and is via cross connections to the second brachial vein on the lateral aspect of the artery.  SPECIMEN(S):  none  INDICATIONS:   Christina Vasquez is a 68 y.o. female with ESRD. The patient is currently on dialysis via [facility]. They were seen in the office for evaluation of hemodialysis access. The risks an benefits including of access creation were reviewed including: need for additional procedures, need for additional creations, steal, ischemia monomelic neuropathy, failure of access, and bleeding. The patient expressed understand and is willing to proceed.    DESCRIPTION: The patient was brought to the operating room positioned supine on the operating table.  The right arm was prepped and draped in usual  sterile fashion.  Timeout was performed and preoperative antibiotics were administered.  The case began with right arm ultrasound fistula mapping.  Multiple branches were noted and marked.  3 skip incisions were made along the course of the basilic vein.  This was carried through the subcutaneous fat to the brachiobasilic fistula.  The fistula was mobilized and multiple branches ligated using silk ties and clips.  It should be noted that the basilic vein had an early confluence to the brachial vein in the mid arm and this brachial vein was ligated in the more proximal upper arm with a previous AV graft placement.  The outflow was noted to be via cross connections to the second brachial vein and therefore full mobilization of the L4 brachial vein in order to lengthen the fistula was not possible otherwise it would disrupt all outflow. A subcutaneous tunnel was created with a uterine clamp.  It should be noted that this was only slightly more lateral/anterior to the natural position of the basilic vein due short length from the early confluence.  Next, the anterior aspect of the fistula was marked and the fistula was clamped proximally and distally and transected a few centimeters from the anastomosis..  The vein was pulled through the tunnel tract and reanastomosed using a 6.0 prolene suture in running fashion. The wound bed was irrigated with saline, hemostasis achieved with suture and cautery.  The ligated remnant of the AV graft was noted in the upper arm incision.  In order to minimize infection risk the anastomosis to the upper arm brachial vein was dissected and a 2-0 silk tie was used to ligate the brachial vein in order to completely excise  the graft remnant.  The deeper tissue was closed with 3-0 Vicryl and the skin closed with 4-0 Monocryl.  Dermabond was applied the incisions and the arm was ACE wrapped. The patient was transferred to PACU in stable condition.    COMPLICATIONS: None  apparent  CONDITION: Stable  Daria Pastures MD [facility] Phone Number: [phone] 02/05/2024 12:07 PM

## 2024-02-05 NOTE — Anesthesia Postprocedure Evaluation (Signed)
 Anesthesia Post Note  Patient: Christina Vasquez  Procedure(s) Performed: RIGHT ARM BASILIC VEIN TRANSPOSITION (Right: Arm Upper) RIGHT ARM FISTULA SUPERFICIALIZATION (Right: Arm Upper)     Patient location during evaluation: PACU Anesthesia Type: General Level of consciousness: awake and alert Pain management: pain level controlled Vital Signs Assessment: post-procedure vital signs reviewed and stable Respiratory status: spontaneous breathing, nonlabored ventilation and respiratory function stable Cardiovascular status: stable and blood pressure returned to baseline Anesthetic complications: no  No notable events documented.  Last Vitals:  Vitals:   02/05/24 1245 02/05/24 1300  BP: 129/74 121/80  Pulse: 82 81  Resp: 14 12  Temp:  36.6 C  SpO2: 98% 92%    Last Pain:  Vitals:   02/05/24 1300  TempSrc:   PainSc: 3                  Beryle Lathe

## 2024-02-05 NOTE — Anesthesia Preprocedure Evaluation (Addendum)
 Anesthesia Evaluation  Patient identified by MRN, date of birth, ID band Patient awake    Reviewed: Allergy & Precautions, NPO status , Patient's Chart, lab work & pertinent test results, reviewed documented beta blocker date and time   History of Anesthesia Complications (+) PONV and history of anesthetic complications  Airway Mallampati: III  TM Distance: >3 FB Neck ROM: Full    Dental  (+) Dental Advisory Given   Pulmonary sleep apnea    Pulmonary exam normal        Cardiovascular hypertension, Pt. on medications and Pt. on home beta blockers + Peripheral Vascular Disease and + DVT  Normal cardiovascular exam     Neuro/Psych  PSYCHIATRIC DISORDERS Anxiety Depression     PRES     GI/Hepatic Neg liver ROS,GERD  Medicated and Controlled,,  Endo/Other    Class 3 obesity  Renal/GU ESRF and DialysisRenal disease     Musculoskeletal  (+) Arthritis ,    Abdominal  (+) + obese  Peds  Hematology negative hematology ROS (+)   Anesthesia Other Findings   Reproductive/Obstetrics                             Anesthesia Physical Anesthesia Plan  ASA: 3  Anesthesia Plan: General   Post-op Pain Management: Tylenol PO (pre-op)*   Induction: Intravenous  PONV Risk Score and Plan: 4 or greater and Treatment may vary due to age or medical condition, Ondansetron and Dexamethasone  Airway Management Planned: LMA  Additional Equipment: None  Intra-op Plan:   Post-operative Plan: Extubation in OR  Informed Consent: I have reviewed the patients History and Physical, chart, labs and discussed the procedure including the risks, benefits and alternatives for the proposed anesthesia with the patient or authorized representative who has indicated his/her understanding and acceptance.     Dental advisory given  Plan Discussed with: CRNA and Anesthesiologist  Anesthesia Plan Comments:          Anesthesia Quick Evaluation

## 2024-02-05 NOTE — Transfer of Care (Signed)
 Immediate Anesthesia Transfer of Care Note  Patient: Christina Vasquez  Procedure(s) Performed: RIGHT ARM BASILIC VEIN TRANSPOSITION (Right: Arm Upper) RIGHT ARM FISTULA SUPERFICIALIZATION (Right: Arm Upper)  Patient Location: PACU  Anesthesia Type:General  Level of Consciousness: drowsy  Airway & Oxygen Therapy: Patient connected to face mask oxygen  Post-op Assessment: Report given to RN and Post -op Vital signs reviewed and stable  Post vital signs: Reviewed and stable  Last Vitals:  Vitals Value Taken Time  BP 143/78 1221  Temp    Pulse 81   Resp 10   SpO2 100     Last Pain:  Vitals:   02/05/24 0845  TempSrc:   PainSc: 0-No pain      Patients Stated Pain Goal: 0 (02/05/24 0827)  Complications: No notable events documented.

## 2024-02-05 NOTE — Anesthesia Procedure Notes (Signed)
 Procedure Name: LMA Insertion Date/Time: 02/05/2024 10:13 AM  Performed by: Heron Sabins, CRNAPre-anesthesia Checklist: Patient identified, Emergency Drugs available, Suction available and Patient being monitored Patient Re-evaluated:Patient Re-evaluated prior to induction Oxygen Delivery Method: Circle System Utilized Preoxygenation: Pre-oxygenation with 100% oxygen Induction Type: IV induction Ventilation: Mask ventilation without difficulty LMA: LMA inserted LMA Size: 4.0 Number of attempts: 1 Airway Equipment and Method: Bite block Placement Confirmation: positive ETCO2 Tube secured with: Tape Dental Injury: Teeth and Oropharynx as per pre-operative assessment

## 2024-02-05 NOTE — Discharge Instructions (Signed)
   [facility]  Discharge Instructions  AV Fistula or Graft Surgery for Dialysis Access  Please refer to the following instructions for your post-procedure care. Your surgeon or physician assistant will discuss any changes with you.  Activity  You may drive the day following your surgery, if you are comfortable and no longer taking prescription pain medication. Resume full activity as the soreness in your incision resolves.  Bathing/Showering  You may shower after you go home. Keep your incision dry for 48 hours. Do not soak in a bathtub, hot tub, or swim until the incision heals completely. You may not shower if you have a hemodialysis catheter.  Incision Care  Clean your incision with mild soap and water after 48 hours. Pat the area dry with a clean towel. You do not need a bandage unless otherwise instructed. Do not apply any ointments or creams to your incision. You may have skin glue on your incision. Do not peel it off. It will come off on its own in about one week. Your arm may swell a bit after surgery. To reduce swelling use pillows to elevate your arm so it is above your heart. Your doctor will tell you if you need to lightly wrap your arm with an ACE bandage.  Diet  Resume your normal diet. There are not special food restrictions following this procedure. In order to heal from your surgery, it is CRITICAL to get adequate nutrition. Your body requires vitamins, minerals, and protein. Vegetables are the best source of vitamins and minerals. Vegetables also provide the perfect balance of protein. Processed food has little nutritional value, so try to avoid this.  Medications  Resume taking all of your medications. If your incision is causing pain, you may take over-the counter pain relievers such as acetaminophen (Tylenol). If you were prescribed a stronger pain medication, please be aware these medications can cause nausea and constipation. Prevent  nausea by taking the medication with a snack or meal. Avoid constipation by drinking plenty of fluids and eating foods with high amount of fiber, such as fruits, vegetables, and grains.  Do not take Tylenol if you are taking prescription pain medications.  Follow up Your surgeon may want to see you in the office following your access surgery. If so, this will be arranged at the time of your surgery.  Please call us immediately for any of the following conditions:  Increased pain, redness, drainage (pus) from your incision site Fever of 101 degrees or higher Severe or worsening pain at your incision site Hand pain or numbness.  Reduce your risk of vascular disease:  Stop smoking. If you would like help, call [facility] at 1-800-QUIT-NOW (606-664-6361) or [place] at 906-458-7624  Manage your cholesterol Maintain a desired weight Control your diabetes Keep your blood pressure down  Dialysis  It will take several weeks to several months for your new dialysis access to be ready for use. Your surgeon will determine when it is okay to use it. Your nephrologist will continue to direct your dialysis. You can continue to use your Permcath until your new access is ready for use.   02/05/2024 Christina Vasquez [id number] 24-Jan-1956  Surgeon(s): Daria Pastures, MD  Procedure(s): RIGHT ARM BASILIC VEIN TRANSPOSITION RIGHT ARM FISTULA SUPERFICIALIZATION  x Do not stick fistula for 6 weeks    If you have any questions, please call the office at [phone].

## 2024-02-06 ENCOUNTER — Encounter (HOSPITAL_COMMUNITY): Payer: Self-pay | Admitting: Vascular Surgery

## 2024-02-06 DIAGNOSIS — D631 Anemia in chronic kidney disease: Secondary | ICD-10-CM | POA: Diagnosis not present

## 2024-02-06 DIAGNOSIS — N2581 Secondary hyperparathyroidism of renal origin: Secondary | ICD-10-CM | POA: Diagnosis not present

## 2024-02-06 DIAGNOSIS — Z992 Dependence on renal dialysis: Secondary | ICD-10-CM | POA: Diagnosis not present

## 2024-02-06 DIAGNOSIS — D689 Coagulation defect, unspecified: Secondary | ICD-10-CM | POA: Diagnosis not present

## 2024-02-06 DIAGNOSIS — T8249XA Other complication of vascular dialysis catheter, initial encounter: Secondary | ICD-10-CM | POA: Diagnosis not present

## 2024-02-06 DIAGNOSIS — N186 End stage renal disease: Secondary | ICD-10-CM | POA: Diagnosis not present

## 2024-02-06 DIAGNOSIS — L299 Pruritus, unspecified: Secondary | ICD-10-CM | POA: Diagnosis not present

## 2024-02-08 DIAGNOSIS — N186 End stage renal disease: Secondary | ICD-10-CM | POA: Diagnosis not present

## 2024-02-08 DIAGNOSIS — L299 Pruritus, unspecified: Secondary | ICD-10-CM | POA: Diagnosis not present

## 2024-02-08 DIAGNOSIS — D689 Coagulation defect, unspecified: Secondary | ICD-10-CM | POA: Diagnosis not present

## 2024-02-08 DIAGNOSIS — D631 Anemia in chronic kidney disease: Secondary | ICD-10-CM | POA: Diagnosis not present

## 2024-02-08 DIAGNOSIS — Z992 Dependence on renal dialysis: Secondary | ICD-10-CM | POA: Diagnosis not present

## 2024-02-08 DIAGNOSIS — N2581 Secondary hyperparathyroidism of renal origin: Secondary | ICD-10-CM | POA: Diagnosis not present

## 2024-02-08 DIAGNOSIS — T8249XA Other complication of vascular dialysis catheter, initial encounter: Secondary | ICD-10-CM | POA: Diagnosis not present

## 2024-02-11 DIAGNOSIS — D631 Anemia in chronic kidney disease: Secondary | ICD-10-CM | POA: Diagnosis not present

## 2024-02-11 DIAGNOSIS — T8249XA Other complication of vascular dialysis catheter, initial encounter: Secondary | ICD-10-CM | POA: Diagnosis not present

## 2024-02-11 DIAGNOSIS — D689 Coagulation defect, unspecified: Secondary | ICD-10-CM | POA: Diagnosis not present

## 2024-02-11 DIAGNOSIS — Z992 Dependence on renal dialysis: Secondary | ICD-10-CM | POA: Diagnosis not present

## 2024-02-11 DIAGNOSIS — N186 End stage renal disease: Secondary | ICD-10-CM | POA: Diagnosis not present

## 2024-02-11 DIAGNOSIS — N2581 Secondary hyperparathyroidism of renal origin: Secondary | ICD-10-CM | POA: Diagnosis not present

## 2024-02-11 DIAGNOSIS — L299 Pruritus, unspecified: Secondary | ICD-10-CM | POA: Diagnosis not present

## 2024-02-13 DIAGNOSIS — Z992 Dependence on renal dialysis: Secondary | ICD-10-CM | POA: Diagnosis not present

## 2024-02-13 DIAGNOSIS — D631 Anemia in chronic kidney disease: Secondary | ICD-10-CM | POA: Diagnosis not present

## 2024-02-13 DIAGNOSIS — D689 Coagulation defect, unspecified: Secondary | ICD-10-CM | POA: Diagnosis not present

## 2024-02-13 DIAGNOSIS — T8249XA Other complication of vascular dialysis catheter, initial encounter: Secondary | ICD-10-CM | POA: Diagnosis not present

## 2024-02-13 DIAGNOSIS — L299 Pruritus, unspecified: Secondary | ICD-10-CM | POA: Diagnosis not present

## 2024-02-13 DIAGNOSIS — N2581 Secondary hyperparathyroidism of renal origin: Secondary | ICD-10-CM | POA: Diagnosis not present

## 2024-02-13 DIAGNOSIS — N186 End stage renal disease: Secondary | ICD-10-CM | POA: Diagnosis not present

## 2024-02-15 DIAGNOSIS — L299 Pruritus, unspecified: Secondary | ICD-10-CM | POA: Diagnosis not present

## 2024-02-15 DIAGNOSIS — T8249XA Other complication of vascular dialysis catheter, initial encounter: Secondary | ICD-10-CM | POA: Diagnosis not present

## 2024-02-15 DIAGNOSIS — N186 End stage renal disease: Secondary | ICD-10-CM | POA: Diagnosis not present

## 2024-02-15 DIAGNOSIS — N2581 Secondary hyperparathyroidism of renal origin: Secondary | ICD-10-CM | POA: Diagnosis not present

## 2024-02-15 DIAGNOSIS — D631 Anemia in chronic kidney disease: Secondary | ICD-10-CM | POA: Diagnosis not present

## 2024-02-15 DIAGNOSIS — Z992 Dependence on renal dialysis: Secondary | ICD-10-CM | POA: Diagnosis not present

## 2024-02-15 DIAGNOSIS — D689 Coagulation defect, unspecified: Secondary | ICD-10-CM | POA: Diagnosis not present

## 2024-02-18 DIAGNOSIS — D631 Anemia in chronic kidney disease: Secondary | ICD-10-CM | POA: Diagnosis not present

## 2024-02-18 DIAGNOSIS — N186 End stage renal disease: Secondary | ICD-10-CM | POA: Diagnosis not present

## 2024-02-18 DIAGNOSIS — Z992 Dependence on renal dialysis: Secondary | ICD-10-CM | POA: Diagnosis not present

## 2024-02-18 DIAGNOSIS — N2581 Secondary hyperparathyroidism of renal origin: Secondary | ICD-10-CM | POA: Diagnosis not present

## 2024-02-18 DIAGNOSIS — L299 Pruritus, unspecified: Secondary | ICD-10-CM | POA: Diagnosis not present

## 2024-02-18 DIAGNOSIS — T8249XA Other complication of vascular dialysis catheter, initial encounter: Secondary | ICD-10-CM | POA: Diagnosis not present

## 2024-02-18 DIAGNOSIS — D689 Coagulation defect, unspecified: Secondary | ICD-10-CM | POA: Diagnosis not present

## 2024-02-20 DIAGNOSIS — N186 End stage renal disease: Secondary | ICD-10-CM | POA: Diagnosis not present

## 2024-02-20 DIAGNOSIS — Z992 Dependence on renal dialysis: Secondary | ICD-10-CM | POA: Diagnosis not present

## 2024-02-20 DIAGNOSIS — D689 Coagulation defect, unspecified: Secondary | ICD-10-CM | POA: Diagnosis not present

## 2024-02-20 DIAGNOSIS — T8249XA Other complication of vascular dialysis catheter, initial encounter: Secondary | ICD-10-CM | POA: Diagnosis not present

## 2024-02-20 DIAGNOSIS — L299 Pruritus, unspecified: Secondary | ICD-10-CM | POA: Diagnosis not present

## 2024-02-20 DIAGNOSIS — N2581 Secondary hyperparathyroidism of renal origin: Secondary | ICD-10-CM | POA: Diagnosis not present

## 2024-02-20 DIAGNOSIS — D631 Anemia in chronic kidney disease: Secondary | ICD-10-CM | POA: Diagnosis not present

## 2024-02-22 DIAGNOSIS — N186 End stage renal disease: Secondary | ICD-10-CM | POA: Diagnosis not present

## 2024-02-22 DIAGNOSIS — N2581 Secondary hyperparathyroidism of renal origin: Secondary | ICD-10-CM | POA: Diagnosis not present

## 2024-02-22 DIAGNOSIS — D689 Coagulation defect, unspecified: Secondary | ICD-10-CM | POA: Diagnosis not present

## 2024-02-22 DIAGNOSIS — T8249XA Other complication of vascular dialysis catheter, initial encounter: Secondary | ICD-10-CM | POA: Diagnosis not present

## 2024-02-22 DIAGNOSIS — Z992 Dependence on renal dialysis: Secondary | ICD-10-CM | POA: Diagnosis not present

## 2024-02-22 DIAGNOSIS — D631 Anemia in chronic kidney disease: Secondary | ICD-10-CM | POA: Diagnosis not present

## 2024-02-22 DIAGNOSIS — L299 Pruritus, unspecified: Secondary | ICD-10-CM | POA: Diagnosis not present

## 2024-02-25 DIAGNOSIS — D689 Coagulation defect, unspecified: Secondary | ICD-10-CM | POA: Diagnosis not present

## 2024-02-25 DIAGNOSIS — N186 End stage renal disease: Secondary | ICD-10-CM | POA: Diagnosis not present

## 2024-02-25 DIAGNOSIS — Z992 Dependence on renal dialysis: Secondary | ICD-10-CM | POA: Diagnosis not present

## 2024-02-25 DIAGNOSIS — N2581 Secondary hyperparathyroidism of renal origin: Secondary | ICD-10-CM | POA: Diagnosis not present

## 2024-02-25 DIAGNOSIS — L299 Pruritus, unspecified: Secondary | ICD-10-CM | POA: Diagnosis not present

## 2024-02-25 DIAGNOSIS — D631 Anemia in chronic kidney disease: Secondary | ICD-10-CM | POA: Diagnosis not present

## 2024-02-25 DIAGNOSIS — T8249XA Other complication of vascular dialysis catheter, initial encounter: Secondary | ICD-10-CM | POA: Diagnosis not present

## 2024-02-27 DIAGNOSIS — N186 End stage renal disease: Secondary | ICD-10-CM | POA: Diagnosis not present

## 2024-02-27 DIAGNOSIS — Z992 Dependence on renal dialysis: Secondary | ICD-10-CM | POA: Diagnosis not present

## 2024-02-27 DIAGNOSIS — N2581 Secondary hyperparathyroidism of renal origin: Secondary | ICD-10-CM | POA: Diagnosis not present

## 2024-02-27 DIAGNOSIS — D689 Coagulation defect, unspecified: Secondary | ICD-10-CM | POA: Diagnosis not present

## 2024-02-27 DIAGNOSIS — L299 Pruritus, unspecified: Secondary | ICD-10-CM | POA: Diagnosis not present

## 2024-02-27 DIAGNOSIS — D631 Anemia in chronic kidney disease: Secondary | ICD-10-CM | POA: Diagnosis not present

## 2024-02-27 DIAGNOSIS — T8249XA Other complication of vascular dialysis catheter, initial encounter: Secondary | ICD-10-CM | POA: Diagnosis not present

## 2024-02-29 DIAGNOSIS — D631 Anemia in chronic kidney disease: Secondary | ICD-10-CM | POA: Diagnosis not present

## 2024-02-29 DIAGNOSIS — N2581 Secondary hyperparathyroidism of renal origin: Secondary | ICD-10-CM | POA: Diagnosis not present

## 2024-02-29 DIAGNOSIS — N186 End stage renal disease: Secondary | ICD-10-CM | POA: Diagnosis not present

## 2024-02-29 DIAGNOSIS — L299 Pruritus, unspecified: Secondary | ICD-10-CM | POA: Diagnosis not present

## 2024-02-29 DIAGNOSIS — D689 Coagulation defect, unspecified: Secondary | ICD-10-CM | POA: Diagnosis not present

## 2024-02-29 DIAGNOSIS — T8249XA Other complication of vascular dialysis catheter, initial encounter: Secondary | ICD-10-CM | POA: Diagnosis not present

## 2024-02-29 DIAGNOSIS — Z992 Dependence on renal dialysis: Secondary | ICD-10-CM | POA: Diagnosis not present

## 2024-03-03 DIAGNOSIS — N2581 Secondary hyperparathyroidism of renal origin: Secondary | ICD-10-CM | POA: Diagnosis not present

## 2024-03-03 DIAGNOSIS — D689 Coagulation defect, unspecified: Secondary | ICD-10-CM | POA: Diagnosis not present

## 2024-03-03 DIAGNOSIS — D631 Anemia in chronic kidney disease: Secondary | ICD-10-CM | POA: Diagnosis not present

## 2024-03-03 DIAGNOSIS — Z992 Dependence on renal dialysis: Secondary | ICD-10-CM | POA: Diagnosis not present

## 2024-03-03 DIAGNOSIS — T8249XA Other complication of vascular dialysis catheter, initial encounter: Secondary | ICD-10-CM | POA: Diagnosis not present

## 2024-03-03 DIAGNOSIS — L299 Pruritus, unspecified: Secondary | ICD-10-CM | POA: Diagnosis not present

## 2024-03-03 DIAGNOSIS — N186 End stage renal disease: Secondary | ICD-10-CM | POA: Diagnosis not present

## 2024-03-05 DIAGNOSIS — N2581 Secondary hyperparathyroidism of renal origin: Secondary | ICD-10-CM | POA: Diagnosis not present

## 2024-03-05 DIAGNOSIS — Z992 Dependence on renal dialysis: Secondary | ICD-10-CM | POA: Diagnosis not present

## 2024-03-05 DIAGNOSIS — D631 Anemia in chronic kidney disease: Secondary | ICD-10-CM | POA: Diagnosis not present

## 2024-03-05 DIAGNOSIS — N186 End stage renal disease: Secondary | ICD-10-CM | POA: Diagnosis not present

## 2024-03-05 DIAGNOSIS — T8249XA Other complication of vascular dialysis catheter, initial encounter: Secondary | ICD-10-CM | POA: Diagnosis not present

## 2024-03-05 DIAGNOSIS — L299 Pruritus, unspecified: Secondary | ICD-10-CM | POA: Diagnosis not present

## 2024-03-05 DIAGNOSIS — I129 Hypertensive chronic kidney disease with stage 1 through stage 4 chronic kidney disease, or unspecified chronic kidney disease: Secondary | ICD-10-CM | POA: Diagnosis not present

## 2024-03-05 DIAGNOSIS — D689 Coagulation defect, unspecified: Secondary | ICD-10-CM | POA: Diagnosis not present

## 2024-03-07 ENCOUNTER — Encounter

## 2024-03-07 DIAGNOSIS — D631 Anemia in chronic kidney disease: Secondary | ICD-10-CM | POA: Diagnosis not present

## 2024-03-07 DIAGNOSIS — L299 Pruritus, unspecified: Secondary | ICD-10-CM | POA: Diagnosis not present

## 2024-03-07 DIAGNOSIS — N186 End stage renal disease: Secondary | ICD-10-CM | POA: Diagnosis not present

## 2024-03-07 DIAGNOSIS — N2581 Secondary hyperparathyroidism of renal origin: Secondary | ICD-10-CM | POA: Diagnosis not present

## 2024-03-07 DIAGNOSIS — T8249XA Other complication of vascular dialysis catheter, initial encounter: Secondary | ICD-10-CM | POA: Diagnosis not present

## 2024-03-07 DIAGNOSIS — Z992 Dependence on renal dialysis: Secondary | ICD-10-CM | POA: Diagnosis not present

## 2024-03-07 DIAGNOSIS — D689 Coagulation defect, unspecified: Secondary | ICD-10-CM | POA: Diagnosis not present

## 2024-03-10 DIAGNOSIS — N2581 Secondary hyperparathyroidism of renal origin: Secondary | ICD-10-CM | POA: Diagnosis not present

## 2024-03-10 DIAGNOSIS — T8249XA Other complication of vascular dialysis catheter, initial encounter: Secondary | ICD-10-CM | POA: Diagnosis not present

## 2024-03-10 DIAGNOSIS — D689 Coagulation defect, unspecified: Secondary | ICD-10-CM | POA: Diagnosis not present

## 2024-03-10 DIAGNOSIS — D631 Anemia in chronic kidney disease: Secondary | ICD-10-CM | POA: Diagnosis not present

## 2024-03-10 DIAGNOSIS — N186 End stage renal disease: Secondary | ICD-10-CM | POA: Diagnosis not present

## 2024-03-10 DIAGNOSIS — Z992 Dependence on renal dialysis: Secondary | ICD-10-CM | POA: Diagnosis not present

## 2024-03-10 DIAGNOSIS — L299 Pruritus, unspecified: Secondary | ICD-10-CM | POA: Diagnosis not present

## 2024-03-11 DIAGNOSIS — M1712 Unilateral primary osteoarthritis, left knee: Secondary | ICD-10-CM | POA: Diagnosis not present

## 2024-03-11 DIAGNOSIS — M25562 Pain in left knee: Secondary | ICD-10-CM | POA: Diagnosis not present

## 2024-03-11 DIAGNOSIS — M25662 Stiffness of left knee, not elsewhere classified: Secondary | ICD-10-CM | POA: Diagnosis not present

## 2024-03-11 DIAGNOSIS — R262 Difficulty in walking, not elsewhere classified: Secondary | ICD-10-CM | POA: Diagnosis not present

## 2024-03-12 DIAGNOSIS — D631 Anemia in chronic kidney disease: Secondary | ICD-10-CM | POA: Diagnosis not present

## 2024-03-12 DIAGNOSIS — Z992 Dependence on renal dialysis: Secondary | ICD-10-CM | POA: Diagnosis not present

## 2024-03-12 DIAGNOSIS — N186 End stage renal disease: Secondary | ICD-10-CM | POA: Diagnosis not present

## 2024-03-12 DIAGNOSIS — N2581 Secondary hyperparathyroidism of renal origin: Secondary | ICD-10-CM | POA: Diagnosis not present

## 2024-03-12 DIAGNOSIS — D689 Coagulation defect, unspecified: Secondary | ICD-10-CM | POA: Diagnosis not present

## 2024-03-12 DIAGNOSIS — T8249XA Other complication of vascular dialysis catheter, initial encounter: Secondary | ICD-10-CM | POA: Diagnosis not present

## 2024-03-12 DIAGNOSIS — L299 Pruritus, unspecified: Secondary | ICD-10-CM | POA: Diagnosis not present

## 2024-03-13 NOTE — Progress Notes (Signed)
 Patient ID: Christina Vasquez, female   DOB: 22-Oct-1956, 68 y.o.   MRN: [id number]  Reason for Consult: Routine Post Op   Referred by Chyrel Craw, NP  Subjective:     HPI  Christina Vasquez is a 68 y.o. female who underwent right arm second stage brachiobasilic AVF transposition about 1 month ago.  Reports the incisions healed well.  Denies any new issues with the hand.  Past Medical History:  Diagnosis Date   Anemia    Anxiety    Arthritis    GERD (gastroesophageal reflux disease)    Hyperparathyroidism (HCC)    Hypertension    Major depressive disorder 08/15/2018   PONV (postoperative nausea and vomiting)    Happened one time. Has had sx since then with no N/V   Renal disorder 12/19/2018   Stage 4 ESRD. HD-MWF   Sleep apnea    does not use CPAP   Family History  Problem Relation Age of Onset   Breast cancer Sister    Breast cancer Cousin        maternal first cousin   Past Surgical History:  Procedure Laterality Date   A/V FISTULAGRAM  2019   ABDOMINAL HYSTERECTOMY  2005   AV FISTULA PLACEMENT Right 07/24/2023   Procedure: RIGHT UPPER EXTREMITY ARTERIOVENOUS GRAFT CREATION;  Surgeon: Dannis Dy, MD;  Location: [facility] OR;  Service: Vascular;  Laterality: Right;   AV FISTULA PLACEMENT Right 12/13/2023   Procedure: RIGHT ARTERIOVENOUS BRACHIOCEPHALIC FISTULA CREATION;  Surgeon: Philipp Brawn, MD;  Location: [facility] OR;  Service: Vascular;  Laterality: Right;   AVGG REMOVAL Right 07/31/2023   Procedure: REMOVAL OF RIGHT ARM ARTERIOVENOUS GORETEX GRAFT (AVGG);  Surgeon: Philipp Brawn, MD;  Location: [facility] OR;  Service: Vascular;  Laterality: Right;   BASCILIC VEIN TRANSPOSITION Right 02/05/2024   Procedure: RIGHT ARM BASILIC VEIN TRANSPOSITION;  Surgeon: Philipp Brawn, MD;  Location: [facility] OR;  Service: Vascular;  Laterality: Right;   BREAST EXCISIONAL BIOPSY Left 2017   scar not visible   CHOLECYSTECTOMY  2002   COLONOSCOPY  2010   COLONOSCOPY WITH PROPOFOL  N/A  01/02/2023   Procedure: COLONOSCOPY WITH PROPOFOL ;  Surgeon: Alvis Jourdain, MD;  Location: [facility];  Service: Gastroenterology;  Laterality: N/A;   FISTULA SUPERFICIALIZATION Right 02/05/2024   Procedure: RIGHT ARM FISTULA SUPERFICIALIZATION;  Surgeon: Philipp Brawn, MD;  Location: [facility] OR;  Service: Vascular;  Laterality: Right;   IR AV DIALY SHUNT INTRO NEEDLE/INTRAC INITIAL W/PTA/STENT/IMG LT  2021   Stent placed in Left Arm Fistula. Placed in [place]   LIGATION OF ARTERIOVENOUS  FISTULA Left 06/01/2023   Procedure: LIGATION OF LEFT ARM ARTERIOVENOUS  FISTULA WITH HEMATOMA EVACUATION;  Surgeon: Adine Hoof, MD;  Location: [facility] OR;  Service: Vascular;  Laterality: Left;   POLYPECTOMY  01/02/2023   Procedure: POLYPECTOMY;  Surgeon: Alvis Jourdain, MD;  Location: [facility];  Service: Gastroenterology;;   REVERSE SHOULDER ARTHROPLASTY Right 06/01/2022   Procedure: REVERSE SHOULDER ARTHROPLASTY;  Surgeon: Micheline Ahr, MD;  Location: [facility] OR;  Service: Orthopedics;  Laterality: Right;   REVERSE SHOULDER ARTHROPLASTY Left 09/21/2022   Procedure: REVERSE SHOULDER ARTHROPLASTY;  Surgeon: Micheline Ahr, MD;  Location: [facility] OR;  Service: Orthopedics;  Laterality: Left;   TOTAL KNEE ARTHROPLASTY Right 2008   TUBAL LIGATION     TUNNELLED CATHETER EXCHANGE N/A 01/22/2024   Procedure: TUNNELLED CATHETER EXCHANGE;  Surgeon: Melodie Spry, MD;  Location: [facility] INVASIVE CV LAB;  Service: Cardiovascular;  Laterality: N/A;  Short Social History:  Social History   Tobacco Use   Smoking status: Never   Smokeless tobacco: Never  Substance Use Topics   Alcohol use: Not Currently    Allergies  Allergen Reactions   Meloxicam Hives and Rash   Augmentin  [Amoxicillin -Pot Clavulanate] Nausea And Vomiting   Tetracyclines & Related Hives    Current Outpatient Medications  Medication Sig Dispense Refill   acetaminophen  (TYLENOL ) 650 MG CR tablet Take 1,300 mg by mouth every 8 (eight) hours as  needed for pain.     amLODipine  (NORVASC ) 10 MG tablet Take 10 mg by mouth daily.     carvedilol  (COREG ) 25 MG tablet Take 1 tablet (25 mg total) by mouth 2 (two) times daily with a meal. Do not take on days before dialysis Do not take after dialysis if systolic blood pressure is less than 110. 60 tablet 2   cinacalcet  (SENSIPAR ) 30 MG tablet Take 30 mg by mouth See admin instructions. Take 30 mg daily except skip dose Sundays     gabapentin  (NEURONTIN ) 100 MG capsule Take 2 capsules (200 mg total) by mouth 3 (three) times daily. 180 capsule 5   hydrALAZINE  (APRESOLINE ) 25 MG tablet Take 25 mg by mouth 3 (three) times daily.     Methoxy PEG-Epoetin Beta (MIRCERA IJ) Mircera     omeprazole (PRILOSEC) 40 MG capsule Take 40 mg by mouth daily.     oxyCODONE -acetaminophen  (PERCOCET) 5-325 MG tablet Take 1 tablet by mouth every 6 (six) hours as needed (pain). 20 tablet 0   sucroferric oxyhydroxide (VELPHORO ) 500 MG chewable tablet Chew 1,000-1,500 mg by mouth See admin instructions. Take 1500 mg with each meal and 1000 mg with each snack     venlafaxine  XR (EFFEXOR -XR) 37.5 MG 24 hr capsule Take 37.5 mg by mouth daily with breakfast.     VITAMIN D PO Take 5 capsules by mouth every Monday, Wednesday, and Friday with hemodialysis.     hydrALAZINE  (APRESOLINE ) 50 MG tablet Take 1 tablet (50 mg total) by mouth every 8 (eight) hours. Do not take on days before dialysis Do not take after dialysis if systolic blood pressure is less than 110. (Patient not taking: Reported on 12/07/2023) 90 tablet 2   No current facility-administered medications for this visit.    REVIEW OF SYSTEMS   All other systems were reviewed and are negative     Objective:  Objective   Vitals:   03/14/24 1402  BP: (!) 149/85  Pulse: 86  Resp: 20  Temp: 98.6 F (37 C)  TempSrc: Temporal  SpO2: 99%  Weight: 214 lb 1.6 oz (97.1 kg)  Height: 5\' 2"  (1.575 m)   Body mass index is 39.16 kg/m.  Physical Exam General: no  acute distress Cardiac: hemodynamically stable Pulm: normal work of breathing Neuro: alert, no focal deficit Extremities: Left arm incisions well-healed, palpable thrill and palpable pulse in fistula     Assessment/Plan:     Camyiah Heim is a 68 y.o. female with ESRD who recently underwent R arm second stage brachiobasilic transposition on 02/05/2024.The basilic vein had an early entry into the deep system so the fistula was not able to be swung out as laterally as I would have liked. The incision is healed well and her dialysis center is now okay to start to access the right arm brachiobasilic fistula      Philipp Brawn MD [facility]

## 2024-03-14 ENCOUNTER — Ambulatory Visit: Attending: Vascular Surgery | Admitting: Vascular Surgery

## 2024-03-14 ENCOUNTER — Encounter: Payer: Self-pay | Admitting: Vascular Surgery

## 2024-03-14 VITALS — BP 149/85 | HR 86 | Temp 98.6°F | Resp 20 | Ht 62.0 in | Wt 214.1 lb

## 2024-03-14 DIAGNOSIS — D689 Coagulation defect, unspecified: Secondary | ICD-10-CM | POA: Diagnosis not present

## 2024-03-14 DIAGNOSIS — D631 Anemia in chronic kidney disease: Secondary | ICD-10-CM | POA: Diagnosis not present

## 2024-03-14 DIAGNOSIS — N2581 Secondary hyperparathyroidism of renal origin: Secondary | ICD-10-CM | POA: Diagnosis not present

## 2024-03-14 DIAGNOSIS — T8249XA Other complication of vascular dialysis catheter, initial encounter: Secondary | ICD-10-CM | POA: Diagnosis not present

## 2024-03-14 DIAGNOSIS — L299 Pruritus, unspecified: Secondary | ICD-10-CM | POA: Diagnosis not present

## 2024-03-14 DIAGNOSIS — N186 End stage renal disease: Secondary | ICD-10-CM

## 2024-03-14 DIAGNOSIS — Z992 Dependence on renal dialysis: Secondary | ICD-10-CM | POA: Diagnosis not present

## 2024-03-17 DIAGNOSIS — N2581 Secondary hyperparathyroidism of renal origin: Secondary | ICD-10-CM | POA: Diagnosis not present

## 2024-03-17 DIAGNOSIS — D689 Coagulation defect, unspecified: Secondary | ICD-10-CM | POA: Diagnosis not present

## 2024-03-17 DIAGNOSIS — N186 End stage renal disease: Secondary | ICD-10-CM | POA: Diagnosis not present

## 2024-03-17 DIAGNOSIS — L299 Pruritus, unspecified: Secondary | ICD-10-CM | POA: Diagnosis not present

## 2024-03-17 DIAGNOSIS — D631 Anemia in chronic kidney disease: Secondary | ICD-10-CM | POA: Diagnosis not present

## 2024-03-17 DIAGNOSIS — Z992 Dependence on renal dialysis: Secondary | ICD-10-CM | POA: Diagnosis not present

## 2024-03-17 DIAGNOSIS — T8249XA Other complication of vascular dialysis catheter, initial encounter: Secondary | ICD-10-CM | POA: Diagnosis not present

## 2024-03-19 DIAGNOSIS — L299 Pruritus, unspecified: Secondary | ICD-10-CM | POA: Diagnosis not present

## 2024-03-19 DIAGNOSIS — D689 Coagulation defect, unspecified: Secondary | ICD-10-CM | POA: Diagnosis not present

## 2024-03-19 DIAGNOSIS — Z992 Dependence on renal dialysis: Secondary | ICD-10-CM | POA: Diagnosis not present

## 2024-03-19 DIAGNOSIS — T8249XA Other complication of vascular dialysis catheter, initial encounter: Secondary | ICD-10-CM | POA: Diagnosis not present

## 2024-03-19 DIAGNOSIS — D631 Anemia in chronic kidney disease: Secondary | ICD-10-CM | POA: Diagnosis not present

## 2024-03-19 DIAGNOSIS — N2581 Secondary hyperparathyroidism of renal origin: Secondary | ICD-10-CM | POA: Diagnosis not present

## 2024-03-19 DIAGNOSIS — N186 End stage renal disease: Secondary | ICD-10-CM | POA: Diagnosis not present

## 2024-03-21 DIAGNOSIS — Z992 Dependence on renal dialysis: Secondary | ICD-10-CM | POA: Diagnosis not present

## 2024-03-21 DIAGNOSIS — T8249XA Other complication of vascular dialysis catheter, initial encounter: Secondary | ICD-10-CM | POA: Diagnosis not present

## 2024-03-21 DIAGNOSIS — D689 Coagulation defect, unspecified: Secondary | ICD-10-CM | POA: Diagnosis not present

## 2024-03-21 DIAGNOSIS — N2581 Secondary hyperparathyroidism of renal origin: Secondary | ICD-10-CM | POA: Diagnosis not present

## 2024-03-21 DIAGNOSIS — L299 Pruritus, unspecified: Secondary | ICD-10-CM | POA: Diagnosis not present

## 2024-03-21 DIAGNOSIS — D631 Anemia in chronic kidney disease: Secondary | ICD-10-CM | POA: Diagnosis not present

## 2024-03-21 DIAGNOSIS — N186 End stage renal disease: Secondary | ICD-10-CM | POA: Diagnosis not present

## 2024-03-24 DIAGNOSIS — D631 Anemia in chronic kidney disease: Secondary | ICD-10-CM | POA: Diagnosis not present

## 2024-03-24 DIAGNOSIS — T8249XA Other complication of vascular dialysis catheter, initial encounter: Secondary | ICD-10-CM | POA: Diagnosis not present

## 2024-03-24 DIAGNOSIS — N2581 Secondary hyperparathyroidism of renal origin: Secondary | ICD-10-CM | POA: Diagnosis not present

## 2024-03-24 DIAGNOSIS — Z992 Dependence on renal dialysis: Secondary | ICD-10-CM | POA: Diagnosis not present

## 2024-03-24 DIAGNOSIS — L299 Pruritus, unspecified: Secondary | ICD-10-CM | POA: Diagnosis not present

## 2024-03-24 DIAGNOSIS — N186 End stage renal disease: Secondary | ICD-10-CM | POA: Diagnosis not present

## 2024-03-24 DIAGNOSIS — D689 Coagulation defect, unspecified: Secondary | ICD-10-CM | POA: Diagnosis not present

## 2024-03-26 DIAGNOSIS — D631 Anemia in chronic kidney disease: Secondary | ICD-10-CM | POA: Diagnosis not present

## 2024-03-26 DIAGNOSIS — Z992 Dependence on renal dialysis: Secondary | ICD-10-CM | POA: Diagnosis not present

## 2024-03-26 DIAGNOSIS — N186 End stage renal disease: Secondary | ICD-10-CM | POA: Diagnosis not present

## 2024-03-26 DIAGNOSIS — D689 Coagulation defect, unspecified: Secondary | ICD-10-CM | POA: Diagnosis not present

## 2024-03-26 DIAGNOSIS — T8249XA Other complication of vascular dialysis catheter, initial encounter: Secondary | ICD-10-CM | POA: Diagnosis not present

## 2024-03-26 DIAGNOSIS — N2581 Secondary hyperparathyroidism of renal origin: Secondary | ICD-10-CM | POA: Diagnosis not present

## 2024-03-26 DIAGNOSIS — L299 Pruritus, unspecified: Secondary | ICD-10-CM | POA: Diagnosis not present

## 2024-03-28 DIAGNOSIS — N186 End stage renal disease: Secondary | ICD-10-CM | POA: Diagnosis not present

## 2024-03-28 DIAGNOSIS — Z992 Dependence on renal dialysis: Secondary | ICD-10-CM | POA: Diagnosis not present

## 2024-03-28 DIAGNOSIS — T8249XA Other complication of vascular dialysis catheter, initial encounter: Secondary | ICD-10-CM | POA: Diagnosis not present

## 2024-03-28 DIAGNOSIS — D631 Anemia in chronic kidney disease: Secondary | ICD-10-CM | POA: Diagnosis not present

## 2024-03-28 DIAGNOSIS — L299 Pruritus, unspecified: Secondary | ICD-10-CM | POA: Diagnosis not present

## 2024-03-28 DIAGNOSIS — D689 Coagulation defect, unspecified: Secondary | ICD-10-CM | POA: Diagnosis not present

## 2024-03-28 DIAGNOSIS — N2581 Secondary hyperparathyroidism of renal origin: Secondary | ICD-10-CM | POA: Diagnosis not present

## 2024-03-31 DIAGNOSIS — N186 End stage renal disease: Secondary | ICD-10-CM | POA: Diagnosis not present

## 2024-03-31 DIAGNOSIS — Z992 Dependence on renal dialysis: Secondary | ICD-10-CM | POA: Diagnosis not present

## 2024-03-31 DIAGNOSIS — D689 Coagulation defect, unspecified: Secondary | ICD-10-CM | POA: Diagnosis not present

## 2024-03-31 DIAGNOSIS — N2581 Secondary hyperparathyroidism of renal origin: Secondary | ICD-10-CM | POA: Diagnosis not present

## 2024-03-31 DIAGNOSIS — T8249XA Other complication of vascular dialysis catheter, initial encounter: Secondary | ICD-10-CM | POA: Diagnosis not present

## 2024-03-31 DIAGNOSIS — L299 Pruritus, unspecified: Secondary | ICD-10-CM | POA: Diagnosis not present

## 2024-03-31 DIAGNOSIS — D631 Anemia in chronic kidney disease: Secondary | ICD-10-CM | POA: Diagnosis not present

## 2024-04-02 ENCOUNTER — Other Ambulatory Visit: Payer: Self-pay

## 2024-04-02 ENCOUNTER — Encounter (HOSPITAL_COMMUNITY)
Admission: RE | Disposition: A | Discharge status: Home / Self Care | Payer: Self-pay | Source: Home / Self Care | Attending: Vascular Surgery

## 2024-04-02 ENCOUNTER — Ambulatory Visit (HOSPITAL_COMMUNITY)
Admission: RE | Admit: 2024-04-02 | Discharge: 2024-04-02 | Disposition: A | Discharge status: Home / Self Care | Attending: Vascular Surgery | Admitting: Vascular Surgery

## 2024-04-02 DIAGNOSIS — N186 End stage renal disease: Secondary | ICD-10-CM | POA: Diagnosis not present

## 2024-04-02 DIAGNOSIS — T82898A Other specified complication of vascular prosthetic devices, implants and grafts, initial encounter: Secondary | ICD-10-CM

## 2024-04-02 DIAGNOSIS — L299 Pruritus, unspecified: Secondary | ICD-10-CM | POA: Diagnosis not present

## 2024-04-02 DIAGNOSIS — T8249XA Other complication of vascular dialysis catheter, initial encounter: Secondary | ICD-10-CM | POA: Diagnosis not present

## 2024-04-02 DIAGNOSIS — T829XXA Unspecified complication of cardiac and vascular prosthetic device, implant and graft, initial encounter: Secondary | ICD-10-CM | POA: Insufficient documentation

## 2024-04-02 DIAGNOSIS — N2581 Secondary hyperparathyroidism of renal origin: Secondary | ICD-10-CM | POA: Diagnosis not present

## 2024-04-02 DIAGNOSIS — Y832 Surgical operation with anastomosis, bypass or graft as the cause of abnormal reaction of the patient, or of later complication, without mention of misadventure at the time of the procedure: Secondary | ICD-10-CM | POA: Diagnosis not present

## 2024-04-02 DIAGNOSIS — Z992 Dependence on renal dialysis: Secondary | ICD-10-CM

## 2024-04-02 DIAGNOSIS — I120 Hypertensive chronic kidney disease with stage 5 chronic kidney disease or end stage renal disease: Secondary | ICD-10-CM | POA: Diagnosis not present

## 2024-04-02 DIAGNOSIS — D689 Coagulation defect, unspecified: Secondary | ICD-10-CM | POA: Diagnosis not present

## 2024-04-02 DIAGNOSIS — D631 Anemia in chronic kidney disease: Secondary | ICD-10-CM | POA: Diagnosis not present

## 2024-04-02 HISTORY — PX: A/V SHUNT INTERVENTION: CATH118220

## 2024-04-02 MED ORDER — HEPARIN (PORCINE) IN NACL 1000-0.9 UT/500ML-% IV SOLN
INTRAVENOUS | Status: DC | PRN
  Administered 2024-04-02: 500 mL

## 2024-04-02 MED ORDER — LIDOCAINE HCL (PF) 1 % IJ SOLN
INTRAMUSCULAR | Status: AC
  Filled 2024-04-02: qty 30

## 2024-04-02 MED ORDER — LIDOCAINE HCL (PF) 1 % IJ SOLN
INTRAMUSCULAR | Status: DC | PRN
  Administered 2024-04-02: 5 mL via INTRADERMAL

## 2024-04-02 NOTE — Op Note (Signed)
    NAME: Christina Vasquez    MRN: [id number] DOB: Jul 20, 1956    DATE OF OPERATION: 04/02/2024  PREOP DIAGNOSIS:    Pulsatile right upper extremity fistula with recent infiltration episode  POSTOP DIAGNOSIS:    Same  PROCEDURE:    Ultrasound-guided micropuncture access of the right brachiobasilic fistula Fistulogram  SURGEON: Kayla Part   INDICATIONS:    Maraya Gwilliam is a 68 y.o. female with history of right brachiobasilic fistula with subsequent transposition.  She recently attempted access for the first time which led to an infiltration episode.  She has significant hematoma and now pulsatility within the fistula.  I discussed the risks and benefits of right arm fistulogram, Lemmie elected to proceed.  FINDINGS:   Central portion of the basilic vein occluded with multiple collaterals to the brachial and axillary vein  TECHNIQUE:   Patient is brought to the Cath Lab in a supine position.  She was prepped and draped in standard fashion and a timeout was performed.  The basilic vein was accessed using ultrasound-guided micropuncture needle.  Fistulogram followed.  See above. Prior to central venous imaging and outflow imaging, there was a perforation in the central portion of the basilic vein which occurred while I was trying to recanalize the basilic vein with a Glidewire advantage.  This led to manual pressure in case termination. Access was managed with Monocryl suture.   Patient's basilic vein ends in collaterals.  This needs to be revised to prevent further infiltration episodes and wound post cannulation bleeding.  Kayla Part, MD [facility] DATE OF DICTATION:   04/02/2024

## 2024-04-02 NOTE — H&P (Signed)
 H&P Note     CC: Fistula infiltration now with pulsatile right arm brachiobasilic fistula Requesting Provider:  No ref. provider found  HPI: Christina Vasquez is a 68 y.o. (December 13, 1955) female presenting at the request of nephrology after right sided brachiobasilic fistula infiltration event.  She presents today with significant right arm ecchymosis.  She is currently being dialyzed through a right-sided tunneled dialysis catheter.   Past Medical History:  Diagnosis Date   Anemia    Anxiety    Arthritis    GERD (gastroesophageal reflux disease)    Hyperparathyroidism (HCC)    Hypertension    Major depressive disorder 08/15/2018   PONV (postoperative nausea and vomiting)    Happened one time. Has had sx since then with no N/V   Renal disorder 12/19/2018   Stage 4 ESRD. HD-MWF   Sleep apnea    does not use CPAP    Past Surgical History:  Procedure Laterality Date   A/V FISTULAGRAM  2019   ABDOMINAL HYSTERECTOMY  2005   AV FISTULA PLACEMENT Right 07/24/2023   Procedure: RIGHT UPPER EXTREMITY ARTERIOVENOUS GRAFT CREATION;  Surgeon: Dannis Dy, MD;  Location: [facility] OR;  Service: Vascular;  Laterality: Right;   AV FISTULA PLACEMENT Right 12/13/2023   Procedure: RIGHT ARTERIOVENOUS BRACHIOCEPHALIC FISTULA CREATION;  Surgeon: Philipp Brawn, MD;  Location: [facility] OR;  Service: Vascular;  Laterality: Right;   AVGG REMOVAL Right 07/31/2023   Procedure: REMOVAL OF RIGHT ARM ARTERIOVENOUS GORETEX GRAFT (AVGG);  Surgeon: Philipp Brawn, MD;  Location: [facility] OR;  Service: Vascular;  Laterality: Right;   BASCILIC VEIN TRANSPOSITION Right 02/05/2024   Procedure: RIGHT ARM BASILIC VEIN TRANSPOSITION;  Surgeon: Philipp Brawn, MD;  Location: [facility] OR;  Service: Vascular;  Laterality: Right;   BREAST EXCISIONAL BIOPSY Left 2017   scar not visible   CHOLECYSTECTOMY  2002   COLONOSCOPY  2010   COLONOSCOPY WITH PROPOFOL  N/A 01/02/2023   Procedure: COLONOSCOPY WITH PROPOFOL ;  Surgeon: Alvis Jourdain, MD;  Location: [facility];  Service: Gastroenterology;  Laterality: N/A;   FISTULA SUPERFICIALIZATION Right 02/05/2024   Procedure: RIGHT ARM FISTULA SUPERFICIALIZATION;  Surgeon: Philipp Brawn, MD;  Location: [facility] OR;  Service: Vascular;  Laterality: Right;   IR AV DIALY SHUNT INTRO NEEDLE/INTRAC INITIAL W/PTA/STENT/IMG LT  2021   Stent placed in Left Arm Fistula. Placed in [place]   LIGATION OF ARTERIOVENOUS  FISTULA Left 06/01/2023   Procedure: LIGATION OF LEFT ARM ARTERIOVENOUS  FISTULA WITH HEMATOMA EVACUATION;  Surgeon: Adine Hoof, MD;  Location: [facility] OR;  Service: Vascular;  Laterality: Left;   POLYPECTOMY  01/02/2023   Procedure: POLYPECTOMY;  Surgeon: Alvis Jourdain, MD;  Location: [facility];  Service: Gastroenterology;;   REVERSE SHOULDER ARTHROPLASTY Right 06/01/2022   Procedure: REVERSE SHOULDER ARTHROPLASTY;  Surgeon: Micheline Ahr, MD;  Location: [facility] OR;  Service: Orthopedics;  Laterality: Right;   REVERSE SHOULDER ARTHROPLASTY Left 09/21/2022   Procedure: REVERSE SHOULDER ARTHROPLASTY;  Surgeon: Micheline Ahr, MD;  Location: [facility] OR;  Service: Orthopedics;  Laterality: Left;   TOTAL KNEE ARTHROPLASTY Right 2008   TUBAL LIGATION     TUNNELLED CATHETER EXCHANGE N/A 01/22/2024   Procedure: TUNNELLED CATHETER EXCHANGE;  Surgeon: Melodie Spry, MD;  Location: [facility] INVASIVE CV LAB;  Service: Cardiovascular;  Laterality: N/A;    Social History   Socioeconomic History   Marital status: Widowed    Spouse name: Not on file   Number of children: 2   Years of education: Not  on file   Highest education level: Not on file  Occupational History   Not on file  Tobacco Use   Smoking status: Never   Smokeless tobacco: Never  Vaping Use   Vaping status: Never Used  Substance and Sexual Activity   Alcohol use: Not Currently   Drug use: Never   Sexual activity: Not Currently    Birth control/protection: Surgical    Comment: Hysterectomy  Other Topics Concern   Not  on file  Social History Narrative   Right handed   Caffeine-occasionally   Live alone, windowed   Social Drivers of Health   Financial Resource Strain: Low Risk  (07/04/2018)   Received from [facility], [facility]   Overall Financial Resource Strain (CARDIA)    Difficulty of Paying Living Expenses: Not hard at all  Food Insecurity: No Food Insecurity (08/06/2023)   Hunger Vital Sign    Worried About Running Out of Food in the Last Year: Never true    Ran Out of Food in the Last Year: Never true  Transportation Needs: No Transportation Needs (08/06/2023)   PRAPARE - Administrator, Civil Service (Medical): No    Lack of Transportation (Non-Medical): No  Physical Activity: Unknown (07/04/2018)   Received from [facility], [facility]   Exercise Vital Sign    Days of Exercise per Week: 0 days    Minutes of Exercise per Session: Not on file  Stress: Stress Concern Present (07/04/2018)   Received from [facility], [facility]   [employer] of Occupational Health - Occupational Stress Questionnaire    Feeling of Stress : Very much  Social Connections: Moderately Isolated (07/04/2018)   Received from [facility], [facility]   Social Connection and Isolation Panel [NHANES]    Frequency of Communication with Friends and Family: Twice a week    Frequency of Social Gatherings with Friends and Family: Never    Attends Religious Services: More than 4 times per year    Active Member of [employer] or Organizations: No    Attends Banker Meetings: Never    Marital Status: Widowed  Intimate Partner Violence: Not At Risk (08/06/2023)   Humiliation, Afraid, Rape, and Kick questionnaire    Fear of Current or Ex-Partner: No    Emotionally Abused: No    Physically Abused: No    Sexually Abused: No   Family History  Problem  Relation Age of Onset   Breast cancer Sister    Breast cancer Cousin        maternal first cousin    No current facility-administered medications for this encounter.    Allergies  Allergen Reactions   Meloxicam Hives and Rash   Augmentin  [Amoxicillin -Pot Clavulanate] Nausea And Vomiting   Tetracyclines & Related Hives     REVIEW OF SYSTEMS:  [X]  denotes positive finding, [ ]  denotes negative finding Cardiac  Comments:  Chest pain or chest pressure:    Shortness of breath upon exertion:    Short of breath when lying flat:    Irregular heart rhythm:        Vascular    Pain in calf, thigh, or hip brought on by ambulation:    Pain in feet at night that wakes you up from your sleep:     Blood clot in your veins:    Leg swelling:  Pulmonary    Oxygen at home:    Productive cough:     Wheezing:         Neurologic    Sudden weakness in arms or legs:     Sudden numbness in arms or legs:     Sudden onset of difficulty speaking or slurred speech:    Temporary loss of vision in one eye:     Problems with dizziness:         Gastrointestinal    Blood in stool:     Vomited blood:         Genitourinary    Burning when urinating:     Blood in urine:        Psychiatric    Major depression:         Hematologic    Bleeding problems:    Problems with blood clotting too easily:        Skin    Rashes or ulcers:        Constitutional    Fever or chills:      PHYSICAL EXAMINATION:  Vitals:   04/02/24 1344  BP: (!) 164/92  Pulse: 90  Resp: 18  Temp: 98.2 F (36.8 C)  TempSrc: Oral  SpO2: 97%    General:  WDWN in NAD; vital signs documented above Gait: Not observed HENT: WNL, normocephalic Pulmonary: normal non-labored breathing , without wheezing Cardiac: regular HR Abdomen: soft, NT, no masses Skin: without rashes Vascular Exam/Pulses:  Right Left  Radial 2+ (normal) 2+ (normal)                      Significant pulsatility in the right arm  brachiobasilic fistula Extremities: without ischemic changes, without Gangrene , without cellulitis; without open wounds;  Musculoskeletal: no muscle wasting or atrophy  Neurologic: A&O X 3;  No focal weakness or paresthesias are detected Psychiatric:  The pt has Normal affect.     ASSESSMENT/PLAN: Christina Vasquez is a 68 y.o. female presenting with right arm ecchymosis from recent infiltration episode right after attempted access of brachiobasilic fistula.  She presents for fistulogram in an effort to define flow decreases to future infiltration events.   Kayla Part, MD [facility] [phone]

## 2024-04-04 ENCOUNTER — Encounter (HOSPITAL_COMMUNITY): Payer: Self-pay | Admitting: Vascular Surgery

## 2024-04-04 DIAGNOSIS — L299 Pruritus, unspecified: Secondary | ICD-10-CM | POA: Diagnosis not present

## 2024-04-04 DIAGNOSIS — T8249XA Other complication of vascular dialysis catheter, initial encounter: Secondary | ICD-10-CM | POA: Diagnosis not present

## 2024-04-04 DIAGNOSIS — D631 Anemia in chronic kidney disease: Secondary | ICD-10-CM | POA: Diagnosis not present

## 2024-04-04 DIAGNOSIS — D689 Coagulation defect, unspecified: Secondary | ICD-10-CM | POA: Diagnosis not present

## 2024-04-04 DIAGNOSIS — Z992 Dependence on renal dialysis: Secondary | ICD-10-CM | POA: Diagnosis not present

## 2024-04-04 DIAGNOSIS — N2581 Secondary hyperparathyroidism of renal origin: Secondary | ICD-10-CM | POA: Diagnosis not present

## 2024-04-04 DIAGNOSIS — N186 End stage renal disease: Secondary | ICD-10-CM | POA: Diagnosis not present

## 2024-04-05 DIAGNOSIS — M1712 Unilateral primary osteoarthritis, left knee: Secondary | ICD-10-CM | POA: Diagnosis not present

## 2024-04-05 DIAGNOSIS — I129 Hypertensive chronic kidney disease with stage 1 through stage 4 chronic kidney disease, or unspecified chronic kidney disease: Secondary | ICD-10-CM | POA: Diagnosis not present

## 2024-04-05 DIAGNOSIS — M810 Age-related osteoporosis without current pathological fracture: Secondary | ICD-10-CM | POA: Diagnosis not present

## 2024-04-05 DIAGNOSIS — Z992 Dependence on renal dialysis: Secondary | ICD-10-CM | POA: Diagnosis not present

## 2024-04-05 DIAGNOSIS — N186 End stage renal disease: Secondary | ICD-10-CM | POA: Diagnosis not present

## 2024-04-07 DIAGNOSIS — Z992 Dependence on renal dialysis: Secondary | ICD-10-CM | POA: Diagnosis not present

## 2024-04-07 DIAGNOSIS — N2581 Secondary hyperparathyroidism of renal origin: Secondary | ICD-10-CM | POA: Diagnosis not present

## 2024-04-07 DIAGNOSIS — L299 Pruritus, unspecified: Secondary | ICD-10-CM | POA: Diagnosis not present

## 2024-04-07 DIAGNOSIS — T8249XA Other complication of vascular dialysis catheter, initial encounter: Secondary | ICD-10-CM | POA: Diagnosis not present

## 2024-04-07 DIAGNOSIS — N186 End stage renal disease: Secondary | ICD-10-CM | POA: Diagnosis not present

## 2024-04-07 DIAGNOSIS — D689 Coagulation defect, unspecified: Secondary | ICD-10-CM | POA: Diagnosis not present

## 2024-04-07 DIAGNOSIS — D631 Anemia in chronic kidney disease: Secondary | ICD-10-CM | POA: Diagnosis not present

## 2024-04-09 DIAGNOSIS — Z992 Dependence on renal dialysis: Secondary | ICD-10-CM | POA: Diagnosis not present

## 2024-04-09 DIAGNOSIS — D689 Coagulation defect, unspecified: Secondary | ICD-10-CM | POA: Diagnosis not present

## 2024-04-09 DIAGNOSIS — N2581 Secondary hyperparathyroidism of renal origin: Secondary | ICD-10-CM | POA: Diagnosis not present

## 2024-04-09 DIAGNOSIS — N186 End stage renal disease: Secondary | ICD-10-CM | POA: Diagnosis not present

## 2024-04-09 DIAGNOSIS — T8249XA Other complication of vascular dialysis catheter, initial encounter: Secondary | ICD-10-CM | POA: Diagnosis not present

## 2024-04-09 DIAGNOSIS — D631 Anemia in chronic kidney disease: Secondary | ICD-10-CM | POA: Diagnosis not present

## 2024-04-09 DIAGNOSIS — L299 Pruritus, unspecified: Secondary | ICD-10-CM | POA: Diagnosis not present

## 2024-04-11 DIAGNOSIS — N2581 Secondary hyperparathyroidism of renal origin: Secondary | ICD-10-CM | POA: Diagnosis not present

## 2024-04-11 DIAGNOSIS — Z992 Dependence on renal dialysis: Secondary | ICD-10-CM | POA: Diagnosis not present

## 2024-04-11 DIAGNOSIS — N186 End stage renal disease: Secondary | ICD-10-CM | POA: Diagnosis not present

## 2024-04-11 DIAGNOSIS — D689 Coagulation defect, unspecified: Secondary | ICD-10-CM | POA: Diagnosis not present

## 2024-04-14 DIAGNOSIS — Z992 Dependence on renal dialysis: Secondary | ICD-10-CM | POA: Diagnosis not present

## 2024-04-14 DIAGNOSIS — N2581 Secondary hyperparathyroidism of renal origin: Secondary | ICD-10-CM | POA: Diagnosis not present

## 2024-04-14 DIAGNOSIS — D689 Coagulation defect, unspecified: Secondary | ICD-10-CM | POA: Diagnosis not present

## 2024-04-14 DIAGNOSIS — N186 End stage renal disease: Secondary | ICD-10-CM | POA: Diagnosis not present

## 2024-04-16 DIAGNOSIS — Z992 Dependence on renal dialysis: Secondary | ICD-10-CM | POA: Diagnosis not present

## 2024-04-16 DIAGNOSIS — T8249XA Other complication of vascular dialysis catheter, initial encounter: Secondary | ICD-10-CM | POA: Diagnosis not present

## 2024-04-16 DIAGNOSIS — D689 Coagulation defect, unspecified: Secondary | ICD-10-CM | POA: Diagnosis not present

## 2024-04-16 DIAGNOSIS — N186 End stage renal disease: Secondary | ICD-10-CM | POA: Diagnosis not present

## 2024-04-16 DIAGNOSIS — L299 Pruritus, unspecified: Secondary | ICD-10-CM | POA: Diagnosis not present

## 2024-04-16 DIAGNOSIS — D631 Anemia in chronic kidney disease: Secondary | ICD-10-CM | POA: Diagnosis not present

## 2024-04-16 DIAGNOSIS — N2581 Secondary hyperparathyroidism of renal origin: Secondary | ICD-10-CM | POA: Diagnosis not present

## 2024-04-17 NOTE — H&P (View-Only) (Signed)
 Patient ID: Christina Vasquez, female   DOB: 11/07/1955, 68 y.o.   MRN: [id number]  Reason for Consult: Follow-up   Referred by Chyrel Craw, NP  Subjective:     HPI Christina Vasquez is a 68 y.o. female presenting for HD access evalutation. 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 She recently underwent right arm braciobasilic transposition which has had poor outflow via the proximal brachial vein and has been receiving HD via a TDC.  Past Medical History:  Diagnosis Date   Anemia    Anxiety    Arthritis    GERD (gastroesophageal reflux disease)    Hyperparathyroidism (HCC)    Hypertension    Major depressive disorder 08/15/2018   PONV (postoperative nausea and vomiting)    Happened one time. Has had sx since then with no N/V   Renal disorder 12/19/2018   Stage 4 ESRD. HD-MWF   Sleep apnea    does not use CPAP   Family History  Problem Relation Age of Onset   Breast cancer Sister    Breast cancer Cousin        maternal first cousin   Past Surgical History:  Procedure Laterality Date   A/V FISTULAGRAM  2019   A/V SHUNT INTERVENTION N/A 04/02/2024   Procedure: A/V SHUNT INTERVENTION;  Surgeon: Kayla Part, MD;  Location: [facility];  Service: Cardiovascular;  Laterality: N/A;   ABDOMINAL HYSTERECTOMY  2005   AV FISTULA PLACEMENT Right 07/24/2023   Procedure: RIGHT UPPER EXTREMITY ARTERIOVENOUS GRAFT CREATION;  Surgeon: Dannis Dy, MD;  Location: [facility] OR;  Service: Vascular;  Laterality: Right;   AV FISTULA PLACEMENT Right 12/13/2023   Procedure: RIGHT ARTERIOVENOUS BRACHIOCEPHALIC FISTULA CREATION;  Surgeon: Philipp Brawn, MD;  Location: [facility] OR;  Service: Vascular;  Laterality: Right;   AVGG REMOVAL Right 07/31/2023   Procedure: REMOVAL OF RIGHT ARM ARTERIOVENOUS GORETEX GRAFT (AVGG);  Surgeon: Philipp Brawn, MD;  Location: [facility] OR;  Service: Vascular;  Laterality: Right;   BASCILIC VEIN TRANSPOSITION  Right 02/05/2024   Procedure: RIGHT ARM BASILIC VEIN TRANSPOSITION;  Surgeon: Philipp Brawn, MD;  Location: [facility] OR;  Service: Vascular;  Laterality: Right;   BREAST EXCISIONAL BIOPSY Left 2017   scar not visible   CHOLECYSTECTOMY  2002   COLONOSCOPY  2010   COLONOSCOPY WITH PROPOFOL  N/A 01/02/2023   Procedure: COLONOSCOPY WITH PROPOFOL ;  Surgeon: Alvis Jourdain, MD;  Location[facility];  Service: Gastroenterology;  Laterality: N/A;   FISTULA SUPERFICIALIZATION Right 02/05/2024   Procedure: RIGHT ARM FISTULA SUPERFICIALIZATION;  Surgeon: Philipp Brawn, MD;  Location: [facility] OR;  Service: Vascular;  Laterality: Right;   IR AV DIALY SHUNT INTRO NEEDLE/INTRAC INITIAL W/PTA/STENT/IMG LT  2021   Stent placed in Left Arm Fistula. Placed in [place]   LIGATION OF ARTERIOVENOUS  FISTULA Left 06/01/2023   Procedure: LIGATION OF LEFT ARM ARTERIOVENOUS  FISTULA WITH HEMATOMA EVACUATION;  Surgeon: Adine Hoof, MD;  Location: [facility] OR;  Service: Vascular;  Laterality: Left;   POLYPECTOMY  01/02/2023   Procedure: POLYPECTOMY;  Surgeon: Alvis Jourdain, MD;  Location: [facility];  Service: Gastroenterology;;   REVERSE SHOULDER ARTHROPLASTY Right 06/01/2022   Procedure: REVERSE SHOULDER ARTHROPLASTY;  Surgeon: Micheline Ahr, MD;  Location: [facility] OR;  Service: Orthopedics;  Laterality: Right;   REVERSE SHOULDER ARTHROPLASTY Left 09/21/2022   Procedure: REVERSE SHOULDER ARTHROPLASTY;  Surgeon: Micheline Ahr, MD;  Location: [facility] OR;  Service: Orthopedics;  Laterality: Left;   TOTAL KNEE ARTHROPLASTY Right 2008   TUBAL LIGATION     TUNNELLED CATHETER EXCHANGE N/A 01/22/2024   Procedure: TUNNELLED CATHETER EXCHANGE;  Surgeon: Melodie Spry, MD;  Location: [facility] INVASIVE CV LAB;  Service: Cardiovascular;  Laterality: N/A;    Short Social History:  Social History   Tobacco Use   Smoking status: Never   Smokeless tobacco: Never  Substance Use Topics   Alcohol use: Not Currently    Allergies  Allergen  Reactions   Meloxicam Hives and Rash   Augmentin  [Amoxicillin -Pot Clavulanate] Nausea And Vomiting   Tetracyclines & Related Hives    Current Outpatient Medications  Medication Sig Dispense Refill   acetaminophen  (TYLENOL ) 650 MG CR tablet Take 1,300 mg by mouth every 8 (eight) hours as needed for pain.     amLODipine  (NORVASC ) 10 MG tablet Take 10 mg by mouth daily.     carvedilol  (COREG ) 25 MG tablet Take 1 tablet (25 mg total) by mouth 2 (two) times daily with a meal. Do not take on days before dialysis Do not take after dialysis if systolic blood pressure is less than 110. 60 tablet 2   cinacalcet  (SENSIPAR ) 30 MG tablet Take 30 mg by mouth See admin instructions. Take 30 mg daily except skip dose Sundays     gabapentin  (NEURONTIN ) 100 MG capsule Take 2 capsules (200 mg total) by mouth 3 (three) times daily. 180 capsule 5   hydrALAZINE  (APRESOLINE ) 25 MG tablet Take 25 mg by mouth 3 (three) times daily.     Methoxy PEG-Epoetin Beta (MIRCERA IJ) Mircera     omeprazole (PRILOSEC) 40 MG capsule Take 40 mg by mouth daily.     sucroferric oxyhydroxide (VELPHORO ) 500 MG chewable tablet Chew 1,000-1,500 mg by mouth See admin instructions. Take 1500 mg with each meal and 1000 mg with each snack     venlafaxine  XR (EFFEXOR -XR) 37.5 MG 24 hr capsule Take 37.5 mg by mouth daily with breakfast.     VITAMIN D PO Take 5 capsules by mouth every Monday, Wednesday, and Friday with hemodialysis.     No current facility-administered medications for this visit.    REVIEW OF SYSTEMS  All other systems were reviewed and are negative     Objective:  Objective   Vitals:   04/18/24 1356  BP: (!) 148/88  Pulse: 98  Resp: 20  Temp: 98.5 F (36.9 C)  TempSrc: Temporal  SpO2: 94%  Weight: 234 lb 8 oz (106.4 kg)  Height: 5' 2 (1.575 m)   Body mass index is 42.89 kg/m.  Physical Exam General: no acute distress Cardiac: hemodynamically stable Extremities:  Right arm incisions well-healed,  palpable pulse in fistula   Data: Reviewed fistulagram from Dr. Rosalva Comber on 04/02/2024     Assessment/Plan:   Christina Vasquez is a 68 y.o. female with ESRD who recently underwent R arm second stage brachiobasilic transposition on 02/05/2024.The basilic vein had an early entry into the deep system so the fistula was not able to be swung out as laterally as I would have liked. She recently underwent fistulagram with Rosalva Comber which demonstrated an occlusion of the proximal brachial outflow vein. We discussed that she would now require conversion to a AVG. Risks and benfits of AVF in the RUE were reviewed. Will plan for RUE AVG placement.    Philipp Brawn MD [facility]

## 2024-04-17 NOTE — Progress Notes (Signed)
 Patient ID: Christina Vasquez, female   DOB: 11/07/1955, 68 y.o.   MRN: [id number]  Reason for Consult: Follow-up   Referred by Chyrel Craw, NP  Subjective:     HPI Christina Vasquez is a 68 y.o. female presenting for HD access evalutation. 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 She recently underwent right arm braciobasilic transposition which has had poor outflow via the proximal brachial vein and has been receiving HD via a TDC.  Past Medical History:  Diagnosis Date   Anemia    Anxiety    Arthritis    GERD (gastroesophageal reflux disease)    Hyperparathyroidism (HCC)    Hypertension    Major depressive disorder 08/15/2018   PONV (postoperative nausea and vomiting)    Happened one time. Has had sx since then with no N/V   Renal disorder 12/19/2018   Stage 4 ESRD. HD-MWF   Sleep apnea    does not use CPAP   Family History  Problem Relation Age of Onset   Breast cancer Sister    Breast cancer Cousin        maternal first cousin   Past Surgical History:  Procedure Laterality Date   A/V FISTULAGRAM  2019   A/V SHUNT INTERVENTION N/A 04/02/2024   Procedure: A/V SHUNT INTERVENTION;  Surgeon: Kayla Part, MD;  Location: [facility];  Service: Cardiovascular;  Laterality: N/A;   ABDOMINAL HYSTERECTOMY  2005   AV FISTULA PLACEMENT Right 07/24/2023   Procedure: RIGHT UPPER EXTREMITY ARTERIOVENOUS GRAFT CREATION;  Surgeon: Dannis Dy, MD;  Location: [facility] OR;  Service: Vascular;  Laterality: Right;   AV FISTULA PLACEMENT Right 12/13/2023   Procedure: RIGHT ARTERIOVENOUS BRACHIOCEPHALIC FISTULA CREATION;  Surgeon: Philipp Brawn, MD;  Location: [facility] OR;  Service: Vascular;  Laterality: Right;   AVGG REMOVAL Right 07/31/2023   Procedure: REMOVAL OF RIGHT ARM ARTERIOVENOUS GORETEX GRAFT (AVGG);  Surgeon: Philipp Brawn, MD;  Location: [facility] OR;  Service: Vascular;  Laterality: Right;   BASCILIC VEIN TRANSPOSITION  Right 02/05/2024   Procedure: RIGHT ARM BASILIC VEIN TRANSPOSITION;  Surgeon: Philipp Brawn, MD;  Location: [facility] OR;  Service: Vascular;  Laterality: Right;   BREAST EXCISIONAL BIOPSY Left 2017   scar not visible   CHOLECYSTECTOMY  2002   COLONOSCOPY  2010   COLONOSCOPY WITH PROPOFOL  N/A 01/02/2023   Procedure: COLONOSCOPY WITH PROPOFOL ;  Surgeon: Alvis Jourdain, MD;  Location: [facility];  Service: Gastroenterology;  Laterality: N/A;   FISTULA SUPERFICIALIZATION Right 02/05/2024   Procedure: RIGHT ARM FISTULA SUPERFICIALIZATION;  Surgeon: Philipp Brawn, MD;  Location: [facility] OR;  Service: Vascular;  Laterality: Right;   IR AV DIALY SHUNT INTRO NEEDLE/INTRAC INITIAL W/PTA/STENT/IMG LT  2021   Stent placed in Left Arm Fistula. Placed in [place]   LIGATION OF ARTERIOVENOUS  FISTULA Left 06/01/2023   Procedure: LIGATION OF LEFT ARM ARTERIOVENOUS  FISTULA WITH HEMATOMA EVACUATION;  Surgeon: Adine Hoof, MD;  Location: [facility] OR;  Service: Vascular;  Laterality: Left;   POLYPECTOMY  01/02/2023   Procedure: POLYPECTOMY;  Surgeon: Alvis Jourdain, MD;  Location: [facility];  Service: Gastroenterology;;   REVERSE SHOULDER ARTHROPLASTY Right 06/01/2022   Procedure: REVERSE SHOULDER ARTHROPLASTY;  Surgeon: Micheline Ahr, MD;  Location: [facility] OR;  Service: Orthopedics;  Laterality: Right;   REVERSE SHOULDER ARTHROPLASTY Left 09/21/2022   Procedure: REVERSE SHOULDER ARTHROPLASTY;  Surgeon: Micheline Ahr, MD;  Location: [facility] OR;  Service: Orthopedics;  Laterality: Left;   TOTAL KNEE ARTHROPLASTY Right 2008   TUBAL LIGATION     TUNNELLED CATHETER EXCHANGE N/A 01/22/2024   Procedure: TUNNELLED CATHETER EXCHANGE;  Surgeon: Melodie Spry, MD;  Location: [facility] INVASIVE CV LAB;  Service: Cardiovascular;  Laterality: N/A;    Short Social History:  Social History   Tobacco Use   Smoking status: Never   Smokeless tobacco: Never  Substance Use Topics   Alcohol use: Not Currently    Allergies  Allergen  Reactions   Meloxicam Hives and Rash   Augmentin  [Amoxicillin -Pot Clavulanate] Nausea And Vomiting   Tetracyclines & Related Hives    Current Outpatient Medications  Medication Sig Dispense Refill   acetaminophen  (TYLENOL ) 650 MG CR tablet Take 1,300 mg by mouth every 8 (eight) hours as needed for pain.     amLODipine  (NORVASC ) 10 MG tablet Take 10 mg by mouth daily.     carvedilol  (COREG ) 25 MG tablet Take 1 tablet (25 mg total) by mouth 2 (two) times daily with a meal. Do not take on days before dialysis Do not take after dialysis if systolic blood pressure is less than 110. 60 tablet 2   cinacalcet  (SENSIPAR ) 30 MG tablet Take 30 mg by mouth See admin instructions. Take 30 mg daily except skip dose Sundays     gabapentin  (NEURONTIN ) 100 MG capsule Take 2 capsules (200 mg total) by mouth 3 (three) times daily. 180 capsule 5   hydrALAZINE  (APRESOLINE ) 25 MG tablet Take 25 mg by mouth 3 (three) times daily.     Methoxy PEG-Epoetin Beta (MIRCERA IJ) Mircera     omeprazole (PRILOSEC) 40 MG capsule Take 40 mg by mouth daily.     sucroferric oxyhydroxide (VELPHORO ) 500 MG chewable tablet Chew 1,000-1,500 mg by mouth See admin instructions. Take 1500 mg with each meal and 1000 mg with each snack     venlafaxine  XR (EFFEXOR -XR) 37.5 MG 24 hr capsule Take 37.5 mg by mouth daily with breakfast.     VITAMIN D PO Take 5 capsules by mouth every Monday, Wednesday, and Friday with hemodialysis.     No current facility-administered medications for this visit.    REVIEW OF SYSTEMS  All other systems were reviewed and are negative     Objective:  Objective   Vitals:   04/18/24 1356  BP: (!) 148/88  Pulse: 98  Resp: 20  Temp: 98.5 F (36.9 C)  TempSrc: Temporal  SpO2: 94%  Weight: 234 lb 8 oz (106.4 kg)  Height: 5' 2 (1.575 m)   Body mass index is 42.89 kg/m.  Physical Exam General: no acute distress Cardiac: hemodynamically stable Extremities:  Right arm incisions well-healed,  palpable pulse in fistula   Data: Reviewed fistulagram from Dr. Rosalva Comber on 04/02/2024     Assessment/Plan:   Ellyanna Holton is a 68 y.o. female with ESRD who recently underwent R arm second stage brachiobasilic transposition on 02/05/2024.The basilic vein had an early entry into the deep system so the fistula was not able to be swung out as laterally as I would have liked. She recently underwent fistulagram with Rosalva Comber which demonstrated an occlusion of the proximal brachial outflow vein. We discussed that she would now require conversion to a AVG. Risks and benfits of AVF in the RUE were reviewed. Will plan for RUE AVG placement.    Philipp Brawn MD [facility]

## 2024-04-18 ENCOUNTER — Encounter: Payer: Self-pay | Admitting: Vascular Surgery

## 2024-04-18 ENCOUNTER — Other Ambulatory Visit: Payer: Self-pay

## 2024-04-18 ENCOUNTER — Ambulatory Visit: Attending: Vascular Surgery | Admitting: Vascular Surgery

## 2024-04-18 VITALS — BP 148/88 | HR 98 | Temp 98.5°F | Resp 20 | Ht 62.0 in | Wt 234.5 lb

## 2024-04-18 DIAGNOSIS — N186 End stage renal disease: Secondary | ICD-10-CM

## 2024-04-18 DIAGNOSIS — N2581 Secondary hyperparathyroidism of renal origin: Secondary | ICD-10-CM | POA: Diagnosis not present

## 2024-04-18 DIAGNOSIS — D631 Anemia in chronic kidney disease: Secondary | ICD-10-CM | POA: Diagnosis not present

## 2024-04-18 DIAGNOSIS — T8249XA Other complication of vascular dialysis catheter, initial encounter: Secondary | ICD-10-CM | POA: Diagnosis not present

## 2024-04-18 DIAGNOSIS — D689 Coagulation defect, unspecified: Secondary | ICD-10-CM | POA: Diagnosis not present

## 2024-04-18 DIAGNOSIS — L299 Pruritus, unspecified: Secondary | ICD-10-CM | POA: Diagnosis not present

## 2024-04-18 DIAGNOSIS — Z992 Dependence on renal dialysis: Secondary | ICD-10-CM | POA: Diagnosis not present

## 2024-04-21 DIAGNOSIS — D631 Anemia in chronic kidney disease: Secondary | ICD-10-CM | POA: Diagnosis not present

## 2024-04-21 DIAGNOSIS — D689 Coagulation defect, unspecified: Secondary | ICD-10-CM | POA: Diagnosis not present

## 2024-04-21 DIAGNOSIS — N186 End stage renal disease: Secondary | ICD-10-CM | POA: Diagnosis not present

## 2024-04-21 DIAGNOSIS — Z992 Dependence on renal dialysis: Secondary | ICD-10-CM | POA: Diagnosis not present

## 2024-04-21 DIAGNOSIS — N2581 Secondary hyperparathyroidism of renal origin: Secondary | ICD-10-CM | POA: Diagnosis not present

## 2024-04-21 DIAGNOSIS — T8249XA Other complication of vascular dialysis catheter, initial encounter: Secondary | ICD-10-CM | POA: Diagnosis not present

## 2024-04-21 DIAGNOSIS — L299 Pruritus, unspecified: Secondary | ICD-10-CM | POA: Diagnosis not present

## 2024-04-22 DIAGNOSIS — N2581 Secondary hyperparathyroidism of renal origin: Secondary | ICD-10-CM | POA: Diagnosis not present

## 2024-04-22 DIAGNOSIS — Z992 Dependence on renal dialysis: Secondary | ICD-10-CM | POA: Diagnosis not present

## 2024-04-22 DIAGNOSIS — R413 Other amnesia: Secondary | ICD-10-CM | POA: Diagnosis not present

## 2024-04-22 DIAGNOSIS — Z86718 Personal history of other venous thrombosis and embolism: Secondary | ICD-10-CM | POA: Diagnosis not present

## 2024-04-22 DIAGNOSIS — I129 Hypertensive chronic kidney disease with stage 1 through stage 4 chronic kidney disease, or unspecified chronic kidney disease: Secondary | ICD-10-CM | POA: Diagnosis not present

## 2024-04-22 DIAGNOSIS — N186 End stage renal disease: Secondary | ICD-10-CM | POA: Diagnosis not present

## 2024-04-23 ENCOUNTER — Encounter (HOSPITAL_COMMUNITY): Payer: Self-pay | Admitting: Vascular Surgery

## 2024-04-23 ENCOUNTER — Other Ambulatory Visit: Payer: Self-pay

## 2024-04-23 DIAGNOSIS — L299 Pruritus, unspecified: Secondary | ICD-10-CM | POA: Diagnosis not present

## 2024-04-23 DIAGNOSIS — Z992 Dependence on renal dialysis: Secondary | ICD-10-CM | POA: Diagnosis not present

## 2024-04-23 DIAGNOSIS — N186 End stage renal disease: Secondary | ICD-10-CM | POA: Diagnosis not present

## 2024-04-23 DIAGNOSIS — D631 Anemia in chronic kidney disease: Secondary | ICD-10-CM | POA: Diagnosis not present

## 2024-04-23 DIAGNOSIS — N2581 Secondary hyperparathyroidism of renal origin: Secondary | ICD-10-CM | POA: Diagnosis not present

## 2024-04-23 DIAGNOSIS — D689 Coagulation defect, unspecified: Secondary | ICD-10-CM | POA: Diagnosis not present

## 2024-04-23 DIAGNOSIS — T8249XA Other complication of vascular dialysis catheter, initial encounter: Secondary | ICD-10-CM | POA: Diagnosis not present

## 2024-04-23 NOTE — Anesthesia Preprocedure Evaluation (Addendum)
 Anesthesia Evaluation  Patient identified by MRN, date of birth, ID band Patient awake    Reviewed: Allergy & Precautions, NPO status , Patient's Chart, lab work & pertinent test results, reviewed documented beta blocker date and time   History of Anesthesia Complications (+) PONV and history of anesthetic complications  Airway Mallampati: III  TM Distance: >3 FB Neck ROM: Full    Dental  (+) Dental Advisory Given   Pulmonary sleep apnea    Pulmonary exam normal        Cardiovascular hypertension, Pt. on medications and Pt. on home beta blockers + Peripheral Vascular Disease and + DVT  Normal cardiovascular exam     Neuro/Psych  PSYCHIATRIC DISORDERS Anxiety Depression     PRES     GI/Hepatic Neg liver ROS,GERD  Medicated and Controlled,,  Endo/Other    Class 3 obesity  Renal/GU ESRF and DialysisRenal disease     Musculoskeletal  (+) Arthritis ,    Abdominal  (+) + obese  Peds  Hematology  (+) Blood dyscrasia, anemia   Anesthesia Other Findings Last GLP1 04/12/24  Reproductive/Obstetrics                             Anesthesia Physical Anesthesia Plan  ASA: 3  Anesthesia Plan: General   Post-op Pain Management: Tylenol  PO (pre-op )*   Induction: Intravenous  PONV Risk Score and Plan: 4 or greater and Treatment may vary due to age or medical condition, Ondansetron  and Dexamethasone   Airway Management Planned: LMA  Additional Equipment: None  Intra-op Plan:   Post-operative Plan: Extubation in OR  Informed Consent: I have reviewed the patients History and Physical, chart, labs and discussed the procedure including the risks, benefits and alternatives for the proposed anesthesia with the patient or authorized representative who has indicated his/her understanding and acceptance.     Dental advisory given  Plan Discussed with: CRNA and Anesthesiologist  Anesthesia Plan  Comments:         Anesthesia Quick Evaluation

## 2024-04-23 NOTE — Progress Notes (Signed)
 PCP - Chyrel Craw, NP  Cardiologist -   PPM/ICD - denies Device Orders - n/a Rep Notified - n/a  Chest x-ray - 07-3023 EKG - 08-06-23 Stress Test -  ECHO - 08-07-23 Cardiac Cath -    Sleep study- per patient greater than 10 years ago CPAP - denies  DM -denies  Blood Thinner Instructions: denies Aspirin  Instructions: n/a  ERAS Protcol - NPO  COVID TEST- n/a  Anesthesia review: no  Patient verbally denies any shortness of breath, fever, cough and chest pain during phone call   -------------  SDW INSTRUCTIONS given:  Your procedure is scheduled on April 24, 2024.  Report to [facility] Main Entrance A at 5:30 A.M., and check in at the Admitting office.  Call this number if you have problems the morning of surgery:  [phone]   Remember:  Do not eat or drink after midnight the night before your surgery     Take these medicines the morning of surgery with A SIP OF WATER  acetaminophen  (TYLENOL )  amLODipine  (NORVASC )  carvedilol  (COREG )  gabapentin  (NEURONTIN )  omeprazole (PRILOSEC)  venlafaxine  XR (EFFEXOR -XR)   OZEMPIC LAST DOSE 04-12-24  As of today, STOP taking any Aspirin  (unless otherwise instructed by your surgeon) Aleve, Naproxen, Ibuprofen, Motrin, Advil, Goody's, BC's, all herbal medications, fish oil, and all vitamins.                      Do not wear jewelry, make up, or nail polish            Do not wear lotions, powders, perfumes/colognes, or deodorant.            Do not shave 48 hours prior to surgery.  Men may shave face and neck.            Do not bring valuables to the hospital.            [facility] is not responsible for any belongings or valuables.  Do NOT Smoke (Tobacco/Vaping) 24 hours prior to your procedure If you use a CPAP at night, you may bring all equipment for your overnight stay.   Contacts, glasses, dentures or bridgework may not be worn into surgery.      For patients admitted to the hospital, discharge time will be  determined by your treatment team.   Patients discharged the day of surgery will not be allowed to drive home, and someone needs to stay with them for 24 hours.    Special instructions:   [place]- Preparing For Surgery  Before surgery, you can play an important role. Because skin is not sterile, your skin needs to be as free of germs as possible. You can reduce the number of germs on your skin by washing with CHG (chlorahexidine gluconate) Soap before surgery.  CHG is an antiseptic cleaner which kills germs and bonds with the skin to continue killing germs even after washing.    Oral Hygiene is also important to reduce your risk of infection.  Remember - BRUSH YOUR TEETH THE MORNING OF SURGERY WITH YOUR REGULAR TOOTHPASTE  Please do not use if you have an allergy to CHG or antibacterial soaps. If your skin becomes reddened/irritated stop using the CHG.  Do not shave (including legs and underarms) for at least 48 hours prior to first CHG shower. It is OK to shave your face.  Please follow these instructions carefully.   Shower the NIGHT BEFORE SURGERY and the MORNING OF SURGERY  with DIAL  Soap.   Pat yourself dry with a CLEAN TOWEL.  Wear CLEAN PAJAMAS to bed the night before surgery  Place CLEAN SHEETS on your bed the night of your first shower and DO NOT SLEEP WITH PETS.   Day of Surgery: Please shower morning of surgery  Wear Clean/Comfortable clothing the morning of surgery Do not apply any deodorants/lotions.   Remember to brush your teeth WITH YOUR REGULAR TOOTHPASTE.   Questions were answered. Patient verbalized understanding of instructions.

## 2024-04-24 ENCOUNTER — Ambulatory Visit (HOSPITAL_COMMUNITY)

## 2024-04-24 ENCOUNTER — Other Ambulatory Visit (HOSPITAL_COMMUNITY): Payer: Self-pay

## 2024-04-24 ENCOUNTER — Other Ambulatory Visit: Payer: Self-pay

## 2024-04-24 ENCOUNTER — Ambulatory Visit (HOSPITAL_COMMUNITY)
Admission: RE | Admit: 2024-04-24 | Discharge: 2024-04-24 | Disposition: A | Discharge status: Home / Self Care | Attending: Vascular Surgery | Admitting: Vascular Surgery

## 2024-04-24 ENCOUNTER — Encounter (HOSPITAL_COMMUNITY)
Admission: RE | Disposition: A | Discharge status: Home / Self Care | Payer: Self-pay | Source: Home / Self Care | Attending: Vascular Surgery

## 2024-04-24 DIAGNOSIS — K219 Gastro-esophageal reflux disease without esophagitis: Secondary | ICD-10-CM | POA: Insufficient documentation

## 2024-04-24 DIAGNOSIS — G4733 Obstructive sleep apnea (adult) (pediatric): Secondary | ICD-10-CM | POA: Diagnosis not present

## 2024-04-24 DIAGNOSIS — F32A Depression, unspecified: Secondary | ICD-10-CM | POA: Insufficient documentation

## 2024-04-24 DIAGNOSIS — E66813 Obesity, class 3: Secondary | ICD-10-CM | POA: Insufficient documentation

## 2024-04-24 DIAGNOSIS — I120 Hypertensive chronic kidney disease with stage 5 chronic kidney disease or end stage renal disease: Secondary | ICD-10-CM | POA: Insufficient documentation

## 2024-04-24 DIAGNOSIS — F419 Anxiety disorder, unspecified: Secondary | ICD-10-CM | POA: Diagnosis not present

## 2024-04-24 DIAGNOSIS — Z6841 Body Mass Index (BMI) 40.0 and over, adult: Secondary | ICD-10-CM | POA: Insufficient documentation

## 2024-04-24 DIAGNOSIS — Z992 Dependence on renal dialysis: Secondary | ICD-10-CM

## 2024-04-24 DIAGNOSIS — T82898A Other specified complication of vascular prosthetic devices, implants and grafts, initial encounter: Secondary | ICD-10-CM

## 2024-04-24 DIAGNOSIS — G4730 Sleep apnea, unspecified: Secondary | ICD-10-CM | POA: Insufficient documentation

## 2024-04-24 DIAGNOSIS — N186 End stage renal disease: Secondary | ICD-10-CM | POA: Diagnosis not present

## 2024-04-24 DIAGNOSIS — Z79899 Other long term (current) drug therapy: Secondary | ICD-10-CM | POA: Insufficient documentation

## 2024-04-24 DIAGNOSIS — D631 Anemia in chronic kidney disease: Secondary | ICD-10-CM | POA: Diagnosis not present

## 2024-04-24 HISTORY — PX: INSERTION OF ARTERIOVENOUS (AV) ARTEGRAFT ARM: SHX6779

## 2024-04-24 LAB — POCT I-STAT, CHEM 8
BUN: 39 mg/dL — ABNORMAL HIGH (ref 8–23)
Calcium, Ion: 1.16 mmol/L (ref 1.15–1.40)
Chloride: 96 mmol/L — ABNORMAL LOW (ref 98–111)
Creatinine, Ser: 6.2 mg/dL — ABNORMAL HIGH (ref 0.44–1.00)
Glucose, Bld: 88 mg/dL (ref 70–99)
HCT: 33 % — ABNORMAL LOW (ref 36.0–46.0)
Hemoglobin: 11.2 g/dL — ABNORMAL LOW (ref 12.0–15.0)
Potassium: 4.7 mmol/L (ref 3.5–5.1)
Sodium: 138 mmol/L (ref 135–145)
TCO2: 32 mmol/L (ref 22–32)

## 2024-04-24 SURGERY — INSERTION, GRAFT, ARTERIOVENOUS, UPPER EXTREMITY
Anesthesia: General | Laterality: Right

## 2024-04-24 MED ORDER — HEMOSTATIC AGENTS (NO CHARGE) OPTIME
TOPICAL | Status: DC | PRN
  Administered 2024-04-24: 1 via TOPICAL

## 2024-04-24 MED ORDER — OXYCODONE HCL 5 MG PO TABS
ORAL_TABLET | ORAL | Status: AC
  Filled 2024-04-24: qty 1

## 2024-04-24 MED ORDER — SODIUM CHLORIDE 0.9% FLUSH: 10.0000 mL | INTRAVENOUS | Status: DC | PRN

## 2024-04-24 MED ORDER — PROPOFOL 10 MG/ML IV BOLUS
INTRAVENOUS | Status: AC
  Filled 2024-04-24: qty 20

## 2024-04-24 MED ORDER — DEXAMETHASONE SODIUM PHOSPHATE 10 MG/ML IJ SOLN
INTRAMUSCULAR | Status: DC | PRN
  Administered 2024-04-24: 10 mg via INTRAVENOUS

## 2024-04-24 MED ORDER — CHLORHEXIDINE GLUCONATE 0.12 % MT SOLN
15.0000 mL | Freq: Once | OROMUCOSAL | Status: AC
  Filled 2024-04-24: qty 15

## 2024-04-24 MED ORDER — LIDOCAINE 2% (20 MG/ML) 5 ML SYRINGE
INTRAMUSCULAR | Status: DC | PRN
  Administered 2024-04-24: 80 mg via INTRAVENOUS

## 2024-04-24 MED ORDER — SODIUM CHLORIDE 0.9% FLUSH: 10.0000 mL | Freq: Two times a day (BID) | INTRAVENOUS | Status: DC

## 2024-04-24 MED ORDER — PROPOFOL 10 MG/ML IV BOLUS
INTRAVENOUS | Status: DC | PRN
  Administered 2024-04-24: 100 mg via INTRAVENOUS
  Administered 2024-04-24: 20 mg via INTRAVENOUS
  Administered 2024-04-24: 100 ug/kg/min via INTRAVENOUS
  Administered 2024-04-24: 40 mg via INTRAVENOUS

## 2024-04-24 MED ORDER — VANCOMYCIN HCL 1500 MG/300ML IV SOLN
1500.0000 mg | INTRAVENOUS | Status: AC
  Administered 2024-04-24: 1500 mg via INTRAVENOUS
  Filled 2024-04-24: qty 300

## 2024-04-24 MED ORDER — DROPERIDOL 2.5 MG/ML IJ SOLN: 0.6250 mg | Freq: Once | INTRAMUSCULAR | Status: DC | PRN

## 2024-04-24 MED ORDER — CHLORHEXIDINE GLUCONATE 4 % EX SOLN: 60.0000 mL | Freq: Once | CUTANEOUS | Status: DC

## 2024-04-24 MED ORDER — PHENYLEPHRINE HCL-NACL 20-0.9 MG/250ML-% IV SOLN
INTRAVENOUS | Status: AC
  Filled 2024-04-24: qty 250

## 2024-04-24 MED ORDER — HEPARIN 6000 UNIT IRRIGATION SOLUTION
Status: DC | PRN
  Administered 2024-04-24 (×2): 1

## 2024-04-24 MED ORDER — CARVEDILOL 25 MG PO TABS
25.0000 mg | ORAL_TABLET | Freq: Once | ORAL | Status: AC
  Administered 2024-04-24: 25 mg via ORAL

## 2024-04-24 MED ORDER — LIDOCAINE-EPINEPHRINE (PF) 1 %-1:200000 IJ SOLN
INTRAMUSCULAR | Status: AC
Start: 2024-04-24 — End: 2024-04-24
  Filled 2024-04-24: qty 30

## 2024-04-24 MED ORDER — ONDANSETRON HCL 4 MG/2ML IJ SOLN
INTRAMUSCULAR | Status: DC | PRN
  Administered 2024-04-24: 4 mg via INTRAVENOUS

## 2024-04-24 MED ORDER — HEPARIN SODIUM (PORCINE) 1000 UNIT/ML IJ SOLN
1600.0000 [IU] | Freq: Once | INTRAMUSCULAR | Status: AC
  Administered 2024-04-24: 1600 [IU]

## 2024-04-24 MED ORDER — PROPOFOL 1000 MG/100ML IV EMUL
INTRAVENOUS | Status: AC
  Filled 2024-04-24: qty 300

## 2024-04-24 MED ORDER — OXYCODONE HCL 5 MG PO TABS
5.0000 mg | ORAL_TABLET | Freq: Once | ORAL | Status: AC | PRN
  Administered 2024-04-24: 5 mg via ORAL

## 2024-04-24 MED ORDER — PHENYLEPHRINE 80 MCG/ML (10ML) SYRINGE FOR IV PUSH (FOR BLOOD PRESSURE SUPPORT)
PREFILLED_SYRINGE | INTRAVENOUS | Status: DC | PRN
  Administered 2024-04-24: 80 ug via INTRAVENOUS

## 2024-04-24 MED ORDER — CARVEDILOL 12.5 MG PO TABS
ORAL_TABLET | ORAL | Status: AC
  Filled 2024-04-24: qty 2

## 2024-04-24 MED ORDER — MIDAZOLAM HCL 2 MG/2ML IJ SOLN
INTRAMUSCULAR | Status: DC | PRN
  Administered 2024-04-24: 2 mg via INTRAVENOUS

## 2024-04-24 MED ORDER — SODIUM CHLORIDE 0.9 % IV SOLN
INTRAVENOUS | Status: DC | PRN
Start: 2024-04-24 — End: 2024-04-24

## 2024-04-24 MED ORDER — HEPARIN 6000 UNIT IRRIGATION SOLUTION
Status: AC
  Filled 2024-04-24: qty 500

## 2024-04-24 MED ORDER — CHLORHEXIDINE GLUCONATE CLOTH 2 % EX PADS: 6.0000 | MEDICATED_PAD | Freq: Every day | CUTANEOUS | Status: DC

## 2024-04-24 MED ORDER — OXYCODONE-ACETAMINOPHEN 5-325 MG PO TABS
1.0000 | ORAL_TABLET | Freq: Four times a day (QID) | ORAL | 0 refills | Status: AC | PRN
  Filled 2024-04-24: qty 20, 5d supply, fill #0

## 2024-04-24 MED ORDER — PHENYLEPHRINE HCL-NACL 20-0.9 MG/250ML-% IV SOLN
INTRAVENOUS | Status: DC | PRN
  Administered 2024-04-24: 30 ug/min via INTRAVENOUS

## 2024-04-24 MED ORDER — LIDOCAINE-EPINEPHRINE (PF) 1 %-1:200000 IJ SOLN
INTRAMUSCULAR | Status: DC | PRN
  Administered 2024-04-24: 15 mL

## 2024-04-24 MED ORDER — HEPARIN SODIUM (PORCINE) 1000 UNIT/ML IJ SOLN
INTRAMUSCULAR | Status: AC
  Filled 2024-04-24: qty 2

## 2024-04-24 MED ORDER — CHLORHEXIDINE GLUCONATE 0.12 % MT SOLN
OROMUCOSAL | Status: AC
  Administered 2024-04-24: 15 mL via OROMUCOSAL
  Filled 2024-04-24: qty 15

## 2024-04-24 MED ORDER — MIDAZOLAM HCL 2 MG/2ML IJ SOLN
INTRAMUSCULAR | Status: AC
  Filled 2024-04-24: qty 2

## 2024-04-24 MED ORDER — 0.9 % SODIUM CHLORIDE (POUR BTL) OPTIME
TOPICAL | Status: DC | PRN
  Administered 2024-04-24 (×2): 1000 mL

## 2024-04-24 MED ORDER — ORAL CARE MOUTH RINSE: 15.0000 mL | Freq: Once | OROMUCOSAL | Status: AC

## 2024-04-24 MED ORDER — ACETAMINOPHEN 10 MG/ML IV SOLN: 1000.0000 mg | Freq: Once | INTRAVENOUS | Status: DC | PRN

## 2024-04-24 MED ORDER — FENTANYL CITRATE (PF) 250 MCG/5ML IJ SOLN
INTRAMUSCULAR | Status: DC | PRN
  Administered 2024-04-24 (×2): 50 ug via INTRAVENOUS

## 2024-04-24 MED ORDER — FENTANYL CITRATE (PF) 250 MCG/5ML IJ SOLN
INTRAMUSCULAR | Status: AC
  Filled 2024-04-24: qty 5

## 2024-04-24 MED ORDER — HEPARIN SODIUM (PORCINE) 1000 UNIT/ML IJ SOLN
INTRAMUSCULAR | Status: DC | PRN
  Administered 2024-04-24: 3000 [IU] via INTRAVENOUS

## 2024-04-24 MED ORDER — OXYCODONE HCL 5 MG/5ML PO SOLN: 5.0000 mg | Freq: Once | ORAL | Status: AC | PRN

## 2024-04-24 MED ORDER — FENTANYL CITRATE (PF) 100 MCG/2ML IJ SOLN: 25.0000 ug | INTRAMUSCULAR | Status: DC | PRN

## 2024-04-24 MED ORDER — SODIUM CHLORIDE 0.9% FLUSH: 3.0000 mL | INTRAVENOUS | Status: DC | PRN

## 2024-04-24 SURGICAL SUPPLY — 30 items
ARMBAND PINK RESTRICT EXTREMIT (MISCELLANEOUS) ×2 IMPLANT
BAG COUNTER SPONGE SURGICOUNT (BAG) ×1 IMPLANT
CANISTER SUCTION 3000ML PPV (SUCTIONS) ×1 IMPLANT
CLIP TI MEDIUM 24 (CLIP) ×1 IMPLANT
CLIP TI MEDIUM 6 (CLIP) ×1 IMPLANT
CLIP TI WIDE RED SMALL 24 (CLIP) ×1 IMPLANT
CLIP TI WIDE RED SMALL 6 (CLIP) ×1 IMPLANT
DERMABOND ADVANCED .7 DNX12 (GAUZE/BANDAGES/DRESSINGS) ×1 IMPLANT
ELECTRODE REM PT RTRN 9FT ADLT (ELECTROSURGICAL) ×1 IMPLANT
GLOVE BIOGEL PI IND STRL 7.0 (GLOVE) ×1 IMPLANT
GOWN STRL REUS W/ TWL LRG LVL3 (GOWN DISPOSABLE) ×2 IMPLANT
GOWN STRL REUS W/ TWL XL LVL3 (GOWN DISPOSABLE) ×1 IMPLANT
GRAFT GORETEX STRETCH 6X40 (Vascular Products) IMPLANT
HEMOSTAT SNOW SURGICEL 2X4 (HEMOSTASIS) IMPLANT
INSERT FOGARTY SM (MISCELLANEOUS) ×1 IMPLANT
KIT BASIN OR (CUSTOM PROCEDURE TRAY) ×1 IMPLANT
KIT TURNOVER KIT B (KITS) ×1 IMPLANT
NS IRRIG 1000ML POUR BTL (IV SOLUTION) ×1 IMPLANT
PACK CV ACCESS (CUSTOM PROCEDURE TRAY) ×1 IMPLANT
PAD ARMBOARD POSITIONER FOAM (MISCELLANEOUS) ×2 IMPLANT
POWDER SURGICEL 3.0 GRAM (HEMOSTASIS) IMPLANT
SLING ARM FOAM STRAP LRG (SOFTGOODS) IMPLANT
SUT MNCRL AB 4-0 PS2 18 (SUTURE) IMPLANT
SUT PROLENE 6 0 BV (SUTURE) IMPLANT
SUT SILK 2 0 SH (SUTURE) IMPLANT
SUT VIC AB 3-0 SH 27X BRD (SUTURE) ×2 IMPLANT
SYR TOOMEY 50ML (SYRINGE) IMPLANT
TOWEL GREEN STERILE (TOWEL DISPOSABLE) ×1 IMPLANT
UNDERPAD 30X36 HEAVY ABSORB (UNDERPADS AND DIAPERS) ×1 IMPLANT
WATER STERILE IRR 1000ML POUR (IV SOLUTION) ×1 IMPLANT

## 2024-04-24 NOTE — Op Note (Addendum)
 OPERATIVE NOTE  PROCEDURE:   Intraoperative right arm vein mapping right arm brachial - axillary AVG creation  PRE-OPERATIVE DIAGNOSIS: ESRD  POST-OPERATIVE DIAGNOSIS: same as above   SURGEON: Philipp Brawn MD  ASSISTANT(S): Wynonia Hedges, PA  Given the complexity of the case,  the assistant was necessary in order to expedient the procedure and safely perform the technical aspects of the operation.  The assistant provided traction and countertraction to assist with exposure of the artery and vein.  They also assisted with suture ligation of multiple venous branches. They also assisted with tunneling of the graft.  They played a critical role for both anastomoses.. These skills, especially following the Prolene suture for the anastomosis, could not have been adequately performed by a scrub tech assistant.  ANESTHESIA: general  ESTIMATED BLOOD LOSS: 20 cc  FINDING(S): Proximally the brachiobasilic transposition was widely patent, dilated and pulsatile.  Distally the fistula was sclerotic and had poor deep vein outflow.  Therefore I elected to revise this fistula with a 6 mm PTFE graft from the proximal aspect of the fistula to one of the paired axillary veins.  Palpable and doppler thrill in AVG with multiphasic radial and ulnar artery on completion  SPECIMEN(S):  none  INDICATIONS:   Christina Vasquez is a 68 y.o. female with ESRD. The patient is currently on dialysis via R internal jugular TDC. They were seen in the office for evaluation of hemodialysis access revision. The risks and benefits of access creation were reviewed including: need for additional procedures, need for additional creations, steal, ischemia monomelic neuropathy, failure of access, and bleeding. The patient expressed understand and is willing to proceed.    DESCRIPTION: The patient was brought to the operating room positioned supine on the operating table.  The right arm was prepped and draped in usual  sterile fashion.  Timeout was performed and preoperative antibiotics were administered.  Intraoperative vein mapping of the arm was performed which confirmed the preoperativeimaging and demonstrated a widely patent, dilated and pulsatile brachiobasilic fistula in the [facility] fossa, distally the fistula was sclerotic and in poor outflow due to a diseased segment of the upper arm brachial vein.  I was able to find paired axillary veins that were large and compressible. The right axillary vein at the axilla was exposed using longitudinal incision.  Incision was carried down until the brachial sheath was encountered.  The axillary vein was identified, exposed, encircled with a Silastic Vesseloop. The brachiobasilic fistula was exposed using a transverse incision in the distal arm just above the antecubital fossa.  Incision was carried down through subcutaneous tissue until the anterior aspect of the fistula was encountered.  The fistula was dissected free circumferentially for an approximate 3 cm segment and encircled with a Silastic Vesseloop. Using a curved, sheathed tunneling device, a 6 mm Gore-Tex graft was tunneled subcutaneously in a gentle arc across the biceps.  The patient was then heparinized with 3000 units of IV heparin . The brachiobasilic fistula was clamped proximally and distally I then transected.  The distal aspect was ligated with a 2-0 silk tie and buttressed with a vascular clip.  The proximal aspect was spatulated to match the size of the cortex and an end-to-end anastomosis was created using continuous running suture of 6-0 Prolene.  The anastomosis was completed and hemostasis ensured.  The clamp was removed from the proximal portion of the fistula and moved to the graft. The axillary vein was clamped proximally and distally and transected.  The  distal aspect of the vein was then ligated with a 2-0 silk tie.  The proximal/central portion of the axillary vein was spatulated to match the Gore-Tex  graft and was then anastomosed end to end to the vein using continuous running suture of 6-0 Prolene. Immediately prior to completion the anastomosis was de-aired and flushed.  Anastomosis was then completed.  Hemostasis was insured.   A doppler machine was brought onto the field to interrogate the graft.  Excellent doppler flow was noted in the radial artery. Distal to the venous anastomosis a Doppler bruit was heard. The incisions were copiously irrigated and then closed in layers with 3-0 Vicryl and 4-0 Monocryl for the skin. Dermabond was applied. All counts were correct at the end of the case. The patient tolerated the procedure well and was brought to recovery in stable condition.    COMPLICATIONS: None apparent  CONDITION: Stable  Philipp Brawn MD [facility] Phone Number: [phone] 04/24/2024 9:33 AM

## 2024-04-24 NOTE — Anesthesia Procedure Notes (Signed)
 Procedure Name: LMA Insertion Date/Time: 04/24/2024 7:42 AM  Performed by: Chanda Combes, CRNAPre-anesthesia Checklist: Patient identified, Emergency Drugs available, Suction available and Patient being monitored Patient Re-evaluated:Patient Re-evaluated prior to induction Oxygen Delivery Method: Circle System Utilized Preoxygenation: Pre-oxygenation with 100% oxygen Induction Type: IV induction Ventilation: Mask ventilation without difficulty LMA: LMA inserted LMA Size: 4.0 Number of attempts: 1 Placement Confirmation: positive ETCO2 Tube secured with: Tape Dental Injury: Teeth and Oropharynx as per pre-operative assessment

## 2024-04-24 NOTE — Transfer of Care (Signed)
 Immediate Anesthesia Transfer of Care Note  Patient: Christina Vasquez  Procedure(s) Performed: INSERTION, GRAFT, ARTERIOVENOUS, UPPER EXTREMITY (Right)  Patient Location: PACU  Anesthesia Type:General  Level of Consciousness: awake and sedated  Airway & Oxygen Therapy: Patient Spontanous Breathing and Patient connected to face mask oxygen  Post-op Assessment: Report given to RN and Post -op Vital signs reviewed and stable  Post vital signs: Reviewed and stable  Last Vitals:  Vitals Value Taken Time  BP 122/69 04/24/24 09:34  Temp    Pulse 89 04/24/24 09:37  Resp 15 04/24/24 09:37  SpO2 100 % 04/24/24 09:37  Vitals shown include unfiled device data.  Last Pain:  Vitals:   04/24/24 0628  TempSrc:   PainSc: 0-No pain         Complications: There were no known notable events for this encounter.

## 2024-04-24 NOTE — Anesthesia Postprocedure Evaluation (Signed)
 Anesthesia Post Note  Patient: Christina Vasquez  Procedure(s) Performed: INSERTION, GRAFT, ARTERIOVENOUS, UPPER EXTREMITY (Right)     Patient location during evaluation: PACU Anesthesia Type: General Level of consciousness: awake and alert Pain management: pain level controlled Vital Signs Assessment: post-procedure vital signs reviewed and stable Respiratory status: spontaneous breathing, nonlabored ventilation, respiratory function stable and patient connected to nasal cannula oxygen Cardiovascular status: blood pressure returned to baseline and stable Postop Assessment: no apparent nausea or vomiting Anesthetic complications: no   There were no known notable events for this encounter.  Last Vitals:  Vitals:   04/24/24 1000 04/24/24 1015  BP: 126/77 138/77  Pulse: 88 85  Resp: 14 16  Temp:  36.8 C  SpO2: 100% 98%    Last Pain:  Vitals:   04/24/24 1000  TempSrc:   PainSc: 2                  Lethaniel Rave

## 2024-04-24 NOTE — Interval H&P Note (Signed)
 History and Physical Interval Note:  04/24/2024 7:24 AM  Christina Vasquez  has presented today for surgery, with the diagnosis of ESRD.  The various methods of treatment have been discussed with the patient and family. After consideration of risks, benefits and other options for treatment, the patient has consented to  Procedure(s): INSERTION, GRAFT, ARTERIOVENOUS, UPPER EXTREMITY (Right) as a surgical intervention.  The patient's history has been reviewed, patient examined, no change in status, stable for surgery.  I have reviewed the patient's chart and labs.  Questions were answered to the patient's satisfaction.     Philipp Brawn

## 2024-04-24 NOTE — Discharge Instructions (Signed)
 [facility]  Discharge Instructions  AV Fistula or Graft Surgery for Dialysis Access  Please refer to the following instructions for your post-procedure care. Your surgeon or physician assistant will discuss any changes with you.  Activity  You may drive the day following your surgery, if you are comfortable and no longer taking prescription pain medication. Resume full activity as the soreness in your incision resolves.  Bathing/Showering  You may shower after you go home. Keep your incision dry for 48 hours. Do not soak in a bathtub, hot tub, or swim until the incision heals completely. You may not shower if you have a hemodialysis catheter.  Incision Care  Clean your incision with mild soap and water after 48 hours. Pat the area dry with a clean towel. You do not need a bandage unless otherwise instructed. Do not apply any ointments or creams to your incision. You may have skin glue on your incision. Do not peel it off. It will come off on its own in about one week. Your arm may swell a bit after surgery. To reduce swelling use pillows to elevate your arm so it is above your heart. Your doctor will tell you if you need to lightly wrap your arm with an ACE bandage.  Diet  Resume your normal diet. There are not special food restrictions following this procedure. In order to heal from your surgery, it is CRITICAL to get adequate nutrition. Your body requires vitamins, minerals, and protein. Vegetables are the best source of vitamins and minerals. Vegetables also provide the perfect balance of protein. Processed food has little nutritional value, so try to avoid this.  Medications  Resume taking all of your medications. If your incision is causing pain, you may take over-the counter pain relievers such as acetaminophen  (Tylenol ). If you were prescribed a stronger pain medication, please be aware these medications can cause nausea and constipation. Prevent  nausea by taking the medication with a snack or meal. Avoid constipation by drinking plenty of fluids and eating foods with high amount of fiber, such as fruits, vegetables, and grains.  Do not take Tylenol  if you are taking prescription pain medications.  Follow up Your surgeon may want to see you in the office following your access surgery. If so, this will be arranged at the time of your surgery.  Please call us  immediately for any of the following conditions:  Increased pain, redness, drainage (pus) from your incision site Fever of 101 degrees or higher Severe or worsening pain at your incision site Hand pain or numbness.  Reduce your risk of vascular disease:  Stop smoking. If you would like help, call [facility] at 1-800-QUIT-NOW (450 072 9668) or [place] at 504-296-4326  Manage your cholesterol Maintain a desired weight Control your diabetes Keep your blood pressure down  Dialysis  It will take several weeks to several months for your new dialysis access to be ready for use. Your surgeon will determine when it is okay to use it. Your nephrologist will continue to direct your dialysis. You can continue to use your Permcath until your new access is ready for use.   04/24/2024 Christina Vasquez [id number] 1955-11-11  Surgeon(s): Philipp Brawn, MD  Procedure(s): INSERTION, GRAFT, ARTERIOVENOUS, UPPER EXTREMITY   May stick graft immediately   May stick graft on designated area only:   X Do not stick right AV graft for 4 weeks    If you have any questions, please call the office at [phone].

## 2024-04-25 ENCOUNTER — Encounter (HOSPITAL_COMMUNITY): Payer: Self-pay | Admitting: Vascular Surgery

## 2024-04-25 ENCOUNTER — Ambulatory Visit: Admitting: Vascular Surgery

## 2024-04-25 DIAGNOSIS — T8249XA Other complication of vascular dialysis catheter, initial encounter: Secondary | ICD-10-CM | POA: Diagnosis not present

## 2024-04-25 DIAGNOSIS — L299 Pruritus, unspecified: Secondary | ICD-10-CM | POA: Diagnosis not present

## 2024-04-25 DIAGNOSIS — N186 End stage renal disease: Secondary | ICD-10-CM | POA: Diagnosis not present

## 2024-04-25 DIAGNOSIS — D689 Coagulation defect, unspecified: Secondary | ICD-10-CM | POA: Diagnosis not present

## 2024-04-25 DIAGNOSIS — D631 Anemia in chronic kidney disease: Secondary | ICD-10-CM | POA: Diagnosis not present

## 2024-04-25 DIAGNOSIS — N2581 Secondary hyperparathyroidism of renal origin: Secondary | ICD-10-CM | POA: Diagnosis not present

## 2024-04-25 DIAGNOSIS — Z992 Dependence on renal dialysis: Secondary | ICD-10-CM | POA: Diagnosis not present

## 2024-04-28 DIAGNOSIS — Z992 Dependence on renal dialysis: Secondary | ICD-10-CM | POA: Diagnosis not present

## 2024-04-28 DIAGNOSIS — N2581 Secondary hyperparathyroidism of renal origin: Secondary | ICD-10-CM | POA: Diagnosis not present

## 2024-04-28 DIAGNOSIS — N186 End stage renal disease: Secondary | ICD-10-CM | POA: Diagnosis not present

## 2024-04-28 DIAGNOSIS — T8249XA Other complication of vascular dialysis catheter, initial encounter: Secondary | ICD-10-CM | POA: Diagnosis not present

## 2024-04-28 DIAGNOSIS — D631 Anemia in chronic kidney disease: Secondary | ICD-10-CM | POA: Diagnosis not present

## 2024-04-28 DIAGNOSIS — L299 Pruritus, unspecified: Secondary | ICD-10-CM | POA: Diagnosis not present

## 2024-04-28 DIAGNOSIS — D689 Coagulation defect, unspecified: Secondary | ICD-10-CM | POA: Diagnosis not present

## 2024-04-30 DIAGNOSIS — L299 Pruritus, unspecified: Secondary | ICD-10-CM | POA: Diagnosis not present

## 2024-04-30 DIAGNOSIS — T8249XA Other complication of vascular dialysis catheter, initial encounter: Secondary | ICD-10-CM | POA: Diagnosis not present

## 2024-04-30 DIAGNOSIS — D631 Anemia in chronic kidney disease: Secondary | ICD-10-CM | POA: Diagnosis not present

## 2024-04-30 DIAGNOSIS — N2581 Secondary hyperparathyroidism of renal origin: Secondary | ICD-10-CM | POA: Diagnosis not present

## 2024-04-30 DIAGNOSIS — N186 End stage renal disease: Secondary | ICD-10-CM | POA: Diagnosis not present

## 2024-04-30 DIAGNOSIS — Z992 Dependence on renal dialysis: Secondary | ICD-10-CM | POA: Diagnosis not present

## 2024-04-30 DIAGNOSIS — D689 Coagulation defect, unspecified: Secondary | ICD-10-CM | POA: Diagnosis not present

## 2024-05-02 DIAGNOSIS — N186 End stage renal disease: Secondary | ICD-10-CM | POA: Diagnosis not present

## 2024-05-02 DIAGNOSIS — D631 Anemia in chronic kidney disease: Secondary | ICD-10-CM | POA: Diagnosis not present

## 2024-05-02 DIAGNOSIS — D689 Coagulation defect, unspecified: Secondary | ICD-10-CM | POA: Diagnosis not present

## 2024-05-02 DIAGNOSIS — N2581 Secondary hyperparathyroidism of renal origin: Secondary | ICD-10-CM | POA: Diagnosis not present

## 2024-05-02 DIAGNOSIS — Z992 Dependence on renal dialysis: Secondary | ICD-10-CM | POA: Diagnosis not present

## 2024-05-02 DIAGNOSIS — L299 Pruritus, unspecified: Secondary | ICD-10-CM | POA: Diagnosis not present

## 2024-05-02 DIAGNOSIS — T8249XA Other complication of vascular dialysis catheter, initial encounter: Secondary | ICD-10-CM | POA: Diagnosis not present

## 2024-05-05 DIAGNOSIS — T8249XA Other complication of vascular dialysis catheter, initial encounter: Secondary | ICD-10-CM | POA: Diagnosis not present

## 2024-05-05 DIAGNOSIS — N2581 Secondary hyperparathyroidism of renal origin: Secondary | ICD-10-CM | POA: Diagnosis not present

## 2024-05-05 DIAGNOSIS — I129 Hypertensive chronic kidney disease with stage 1 through stage 4 chronic kidney disease, or unspecified chronic kidney disease: Secondary | ICD-10-CM | POA: Diagnosis not present

## 2024-05-05 DIAGNOSIS — L299 Pruritus, unspecified: Secondary | ICD-10-CM | POA: Diagnosis not present

## 2024-05-05 DIAGNOSIS — M810 Age-related osteoporosis without current pathological fracture: Secondary | ICD-10-CM | POA: Diagnosis not present

## 2024-05-05 DIAGNOSIS — N186 End stage renal disease: Secondary | ICD-10-CM | POA: Diagnosis not present

## 2024-05-05 DIAGNOSIS — D689 Coagulation defect, unspecified: Secondary | ICD-10-CM | POA: Diagnosis not present

## 2024-05-05 DIAGNOSIS — M1712 Unilateral primary osteoarthritis, left knee: Secondary | ICD-10-CM | POA: Diagnosis not present

## 2024-05-05 DIAGNOSIS — Z992 Dependence on renal dialysis: Secondary | ICD-10-CM | POA: Diagnosis not present

## 2024-05-05 DIAGNOSIS — D631 Anemia in chronic kidney disease: Secondary | ICD-10-CM | POA: Diagnosis not present

## 2024-05-07 DIAGNOSIS — L299 Pruritus, unspecified: Secondary | ICD-10-CM | POA: Diagnosis not present

## 2024-05-07 DIAGNOSIS — D689 Coagulation defect, unspecified: Secondary | ICD-10-CM | POA: Diagnosis not present

## 2024-05-07 DIAGNOSIS — Z992 Dependence on renal dialysis: Secondary | ICD-10-CM | POA: Diagnosis not present

## 2024-05-07 DIAGNOSIS — D631 Anemia in chronic kidney disease: Secondary | ICD-10-CM | POA: Diagnosis not present

## 2024-05-07 DIAGNOSIS — N2581 Secondary hyperparathyroidism of renal origin: Secondary | ICD-10-CM | POA: Diagnosis not present

## 2024-05-07 DIAGNOSIS — N186 End stage renal disease: Secondary | ICD-10-CM | POA: Diagnosis not present

## 2024-05-07 DIAGNOSIS — T8249XA Other complication of vascular dialysis catheter, initial encounter: Secondary | ICD-10-CM | POA: Diagnosis not present

## 2024-05-09 DIAGNOSIS — Z992 Dependence on renal dialysis: Secondary | ICD-10-CM | POA: Diagnosis not present

## 2024-05-09 DIAGNOSIS — T8249XA Other complication of vascular dialysis catheter, initial encounter: Secondary | ICD-10-CM | POA: Diagnosis not present

## 2024-05-09 DIAGNOSIS — D689 Coagulation defect, unspecified: Secondary | ICD-10-CM | POA: Diagnosis not present

## 2024-05-09 DIAGNOSIS — N2581 Secondary hyperparathyroidism of renal origin: Secondary | ICD-10-CM | POA: Diagnosis not present

## 2024-05-09 DIAGNOSIS — N186 End stage renal disease: Secondary | ICD-10-CM | POA: Diagnosis not present

## 2024-05-09 DIAGNOSIS — L299 Pruritus, unspecified: Secondary | ICD-10-CM | POA: Diagnosis not present

## 2024-05-09 DIAGNOSIS — D631 Anemia in chronic kidney disease: Secondary | ICD-10-CM | POA: Diagnosis not present

## 2024-05-12 DIAGNOSIS — N186 End stage renal disease: Secondary | ICD-10-CM | POA: Diagnosis not present

## 2024-05-12 DIAGNOSIS — Z992 Dependence on renal dialysis: Secondary | ICD-10-CM | POA: Diagnosis not present

## 2024-05-12 DIAGNOSIS — D689 Coagulation defect, unspecified: Secondary | ICD-10-CM | POA: Diagnosis not present

## 2024-05-12 DIAGNOSIS — N2581 Secondary hyperparathyroidism of renal origin: Secondary | ICD-10-CM | POA: Diagnosis not present

## 2024-05-12 DIAGNOSIS — L299 Pruritus, unspecified: Secondary | ICD-10-CM | POA: Diagnosis not present

## 2024-05-12 DIAGNOSIS — D631 Anemia in chronic kidney disease: Secondary | ICD-10-CM | POA: Diagnosis not present

## 2024-05-12 DIAGNOSIS — T8249XA Other complication of vascular dialysis catheter, initial encounter: Secondary | ICD-10-CM | POA: Diagnosis not present

## 2024-05-14 DIAGNOSIS — N2581 Secondary hyperparathyroidism of renal origin: Secondary | ICD-10-CM | POA: Diagnosis not present

## 2024-05-14 DIAGNOSIS — N186 End stage renal disease: Secondary | ICD-10-CM | POA: Diagnosis not present

## 2024-05-14 DIAGNOSIS — D689 Coagulation defect, unspecified: Secondary | ICD-10-CM | POA: Diagnosis not present

## 2024-05-14 DIAGNOSIS — L299 Pruritus, unspecified: Secondary | ICD-10-CM | POA: Diagnosis not present

## 2024-05-14 DIAGNOSIS — T8249XA Other complication of vascular dialysis catheter, initial encounter: Secondary | ICD-10-CM | POA: Diagnosis not present

## 2024-05-14 DIAGNOSIS — Z992 Dependence on renal dialysis: Secondary | ICD-10-CM | POA: Diagnosis not present

## 2024-05-14 DIAGNOSIS — D631 Anemia in chronic kidney disease: Secondary | ICD-10-CM | POA: Diagnosis not present

## 2024-05-16 DIAGNOSIS — D689 Coagulation defect, unspecified: Secondary | ICD-10-CM | POA: Diagnosis not present

## 2024-05-16 DIAGNOSIS — Z992 Dependence on renal dialysis: Secondary | ICD-10-CM | POA: Diagnosis not present

## 2024-05-16 DIAGNOSIS — D631 Anemia in chronic kidney disease: Secondary | ICD-10-CM | POA: Diagnosis not present

## 2024-05-16 DIAGNOSIS — N2581 Secondary hyperparathyroidism of renal origin: Secondary | ICD-10-CM | POA: Diagnosis not present

## 2024-05-16 DIAGNOSIS — L299 Pruritus, unspecified: Secondary | ICD-10-CM | POA: Diagnosis not present

## 2024-05-16 DIAGNOSIS — N186 End stage renal disease: Secondary | ICD-10-CM | POA: Diagnosis not present

## 2024-05-16 DIAGNOSIS — T8249XA Other complication of vascular dialysis catheter, initial encounter: Secondary | ICD-10-CM | POA: Diagnosis not present

## 2024-05-19 DIAGNOSIS — D631 Anemia in chronic kidney disease: Secondary | ICD-10-CM | POA: Diagnosis not present

## 2024-05-19 DIAGNOSIS — L299 Pruritus, unspecified: Secondary | ICD-10-CM | POA: Diagnosis not present

## 2024-05-19 DIAGNOSIS — N2581 Secondary hyperparathyroidism of renal origin: Secondary | ICD-10-CM | POA: Diagnosis not present

## 2024-05-19 DIAGNOSIS — D689 Coagulation defect, unspecified: Secondary | ICD-10-CM | POA: Diagnosis not present

## 2024-05-19 DIAGNOSIS — N186 End stage renal disease: Secondary | ICD-10-CM | POA: Diagnosis not present

## 2024-05-19 DIAGNOSIS — T8249XA Other complication of vascular dialysis catheter, initial encounter: Secondary | ICD-10-CM | POA: Diagnosis not present

## 2024-05-19 DIAGNOSIS — Z992 Dependence on renal dialysis: Secondary | ICD-10-CM | POA: Diagnosis not present

## 2024-05-21 DIAGNOSIS — T8249XA Other complication of vascular dialysis catheter, initial encounter: Secondary | ICD-10-CM | POA: Diagnosis not present

## 2024-05-21 DIAGNOSIS — Z992 Dependence on renal dialysis: Secondary | ICD-10-CM | POA: Diagnosis not present

## 2024-05-21 DIAGNOSIS — L299 Pruritus, unspecified: Secondary | ICD-10-CM | POA: Diagnosis not present

## 2024-05-21 DIAGNOSIS — D689 Coagulation defect, unspecified: Secondary | ICD-10-CM | POA: Diagnosis not present

## 2024-05-21 DIAGNOSIS — N2581 Secondary hyperparathyroidism of renal origin: Secondary | ICD-10-CM | POA: Diagnosis not present

## 2024-05-21 DIAGNOSIS — N186 End stage renal disease: Secondary | ICD-10-CM | POA: Diagnosis not present

## 2024-05-21 DIAGNOSIS — D631 Anemia in chronic kidney disease: Secondary | ICD-10-CM | POA: Diagnosis not present

## 2024-05-22 NOTE — Progress Notes (Unsigned)
  POST OPERATIVE OFFICE NOTE    CC:  F/u for surgery  HPI:  This is a 68 y.o. female who is s/p right brachial to axillary AVG on 04/24/2024 by Dr. Pearline.  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  Pt states she does *** have pain/numbness in the *** hand.    The pt *** on dialysis *** at *** location.   Allergies  Allergen Reactions   Meloxicam Hives and Rash   Augmentin  [Amoxicillin -Pot Clavulanate] Nausea And Vomiting   Tetracyclines & Related Hives    Current Outpatient Medications  Medication Sig Dispense Refill   acetaminophen  (TYLENOL ) 650 MG CR tablet Take 1,300 mg by mouth every 8 (eight) hours as needed for pain.     amLODipine  (NORVASC ) 10 MG tablet Take 10 mg by mouth daily.     carvedilol  (COREG ) 25 MG tablet Take 1 tablet (25 mg total) by mouth 2 (two) times daily with a meal. Do not take on days before dialysis Do not take after dialysis if systolic blood pressure is less than 110. 60 tablet 2   cinacalcet  (SENSIPAR ) 30 MG tablet Take 30 mg by mouth every Monday, Wednesday, and Friday.     Difelikefalin Acetate (KORSUVA) 65 MCG/1.3ML SOLN Inject 1.1 mLs into the skin every Monday, Wednesday, and Friday. Anti itch     gabapentin  (NEURONTIN ) 100 MG capsule Take 2 capsules (200 mg total) by mouth 3 (three) times daily. 180 capsule 5   Methoxy PEG-Epoetin  Beta (MIRCERA IJ) Mircera     omeprazole (PRILOSEC) 40 MG capsule Take 40 mg by mouth daily.     oxyCODONE -acetaminophen  (PERCOCET) 5-325 MG tablet Take 1 tablet by mouth every 6 (six) hours as needed for severe pain (pain score 7-10). 20 tablet 0   Semaglutide,0.25 or 0.5MG /DOS, (OZEMPIC, 0.25 OR 0.5 MG/DOSE,) 2 MG/1.5ML SOPN Inject 0.5 mg into the skin once a week.     sucroferric oxyhydroxide (VELPHORO ) 500 MG chewable tablet Chew 1,000-1,500 mg by mouth See admin instructions. Take 1500 mg with each meal and 1000 mg with each snack     venlafaxine  XR (EFFEXOR -XR) 37.5 MG  24 hr capsule Take 37.5 mg by mouth daily with breakfast.     VITAMIN D PO Take 5 capsules by mouth every Monday, Wednesday, and Friday with hemodialysis.     No current facility-administered medications for this visit.     ROS:  See HPI  Physical Exam:  ***  Incision:  *** Extremities:   There *** a palpable *** pulse.   Motor and sensory *** in tact.   There *** a thrill/bruit present.  Access is *** easily palpable    Assessment/Plan:  This is a 68 y.o. female who is s/p:  right brachial to axillary AVG on 04/24/2024 by Dr. Pearline   -the pt does *** have evidence of steal. -pt's access can be used ***. -if pt has tunneled catheter, this can be removed at the discretion of the dialysis center once the pt's access has been successfully cannulated to their satisfaction.  -discussed with pt that access does not last forever and will need intervention or even new access at some point.  -the pt will follow up ***   Lucie Apt, [facility] [phone]  Clinic MD:  Pearline

## 2024-05-23 ENCOUNTER — Ambulatory Visit: Attending: Vascular Surgery | Admitting: Physician Assistant

## 2024-05-23 VITALS — BP 168/92 | HR 106 | Temp 98.2°F | Wt 229.5 lb

## 2024-05-23 DIAGNOSIS — N186 End stage renal disease: Secondary | ICD-10-CM

## 2024-05-23 DIAGNOSIS — Z992 Dependence on renal dialysis: Secondary | ICD-10-CM | POA: Diagnosis not present

## 2024-05-23 DIAGNOSIS — D631 Anemia in chronic kidney disease: Secondary | ICD-10-CM | POA: Diagnosis not present

## 2024-05-23 DIAGNOSIS — T8249XA Other complication of vascular dialysis catheter, initial encounter: Secondary | ICD-10-CM | POA: Diagnosis not present

## 2024-05-23 DIAGNOSIS — L299 Pruritus, unspecified: Secondary | ICD-10-CM | POA: Diagnosis not present

## 2024-05-23 DIAGNOSIS — D689 Coagulation defect, unspecified: Secondary | ICD-10-CM | POA: Diagnosis not present

## 2024-05-23 DIAGNOSIS — N2581 Secondary hyperparathyroidism of renal origin: Secondary | ICD-10-CM | POA: Diagnosis not present

## 2024-05-26 DIAGNOSIS — N2581 Secondary hyperparathyroidism of renal origin: Secondary | ICD-10-CM | POA: Diagnosis not present

## 2024-05-26 DIAGNOSIS — D689 Coagulation defect, unspecified: Secondary | ICD-10-CM | POA: Diagnosis not present

## 2024-05-26 DIAGNOSIS — T8249XA Other complication of vascular dialysis catheter, initial encounter: Secondary | ICD-10-CM | POA: Diagnosis not present

## 2024-05-26 DIAGNOSIS — L299 Pruritus, unspecified: Secondary | ICD-10-CM | POA: Diagnosis not present

## 2024-05-26 DIAGNOSIS — Z992 Dependence on renal dialysis: Secondary | ICD-10-CM | POA: Diagnosis not present

## 2024-05-26 DIAGNOSIS — D631 Anemia in chronic kidney disease: Secondary | ICD-10-CM | POA: Diagnosis not present

## 2024-05-26 DIAGNOSIS — N186 End stage renal disease: Secondary | ICD-10-CM | POA: Diagnosis not present

## 2024-05-28 DIAGNOSIS — I739 Peripheral vascular disease, unspecified: Secondary | ICD-10-CM | POA: Diagnosis not present

## 2024-05-28 DIAGNOSIS — Z96659 Presence of unspecified artificial knee joint: Secondary | ICD-10-CM | POA: Diagnosis not present

## 2024-05-28 DIAGNOSIS — Z5181 Encounter for therapeutic drug level monitoring: Secondary | ICD-10-CM | POA: Diagnosis not present

## 2024-05-28 DIAGNOSIS — Z96611 Presence of right artificial shoulder joint: Secondary | ICD-10-CM | POA: Diagnosis not present

## 2024-05-28 DIAGNOSIS — Z940 Kidney transplant status: Secondary | ICD-10-CM | POA: Diagnosis not present

## 2024-05-28 DIAGNOSIS — N186 End stage renal disease: Secondary | ICD-10-CM | POA: Diagnosis not present

## 2024-05-28 DIAGNOSIS — Z95828 Presence of other vascular implants and grafts: Secondary | ICD-10-CM | POA: Diagnosis not present

## 2024-05-28 DIAGNOSIS — D631 Anemia in chronic kidney disease: Secondary | ICD-10-CM | POA: Diagnosis not present

## 2024-05-28 DIAGNOSIS — M1990 Unspecified osteoarthritis, unspecified site: Secondary | ICD-10-CM | POA: Diagnosis not present

## 2024-05-28 DIAGNOSIS — I120 Hypertensive chronic kidney disease with stage 5 chronic kidney disease or end stage renal disease: Secondary | ICD-10-CM | POA: Diagnosis not present

## 2024-05-28 DIAGNOSIS — D689 Coagulation defect, unspecified: Secondary | ICD-10-CM | POA: Diagnosis not present

## 2024-05-28 DIAGNOSIS — Z7982 Long term (current) use of aspirin: Secondary | ICD-10-CM | POA: Diagnosis not present

## 2024-05-28 DIAGNOSIS — I6783 Posterior reversible encephalopathy syndrome: Secondary | ICD-10-CM | POA: Diagnosis not present

## 2024-05-28 DIAGNOSIS — Z79621 Long term (current) use of calcineurin inhibitor: Secondary | ICD-10-CM | POA: Diagnosis not present

## 2024-05-28 DIAGNOSIS — Z452 Encounter for adjustment and management of vascular access device: Secondary | ICD-10-CM | POA: Diagnosis not present

## 2024-05-28 DIAGNOSIS — Z4822 Encounter for aftercare following kidney transplant: Secondary | ICD-10-CM | POA: Diagnosis not present

## 2024-05-28 DIAGNOSIS — I10 Essential (primary) hypertension: Secondary | ICD-10-CM | POA: Diagnosis not present

## 2024-05-28 DIAGNOSIS — E213 Hyperparathyroidism, unspecified: Secondary | ICD-10-CM | POA: Diagnosis not present

## 2024-05-28 DIAGNOSIS — G4733 Obstructive sleep apnea (adult) (pediatric): Secondary | ICD-10-CM | POA: Diagnosis not present

## 2024-05-28 DIAGNOSIS — D84821 Immunodeficiency due to drugs: Secondary | ICD-10-CM | POA: Diagnosis not present

## 2024-05-28 DIAGNOSIS — N269 Renal sclerosis, unspecified: Secondary | ICD-10-CM | POA: Diagnosis not present

## 2024-05-28 DIAGNOSIS — Z9049 Acquired absence of other specified parts of digestive tract: Secondary | ICD-10-CM | POA: Diagnosis not present

## 2024-05-28 DIAGNOSIS — I129 Hypertensive chronic kidney disease with stage 1 through stage 4 chronic kidney disease, or unspecified chronic kidney disease: Secondary | ICD-10-CM | POA: Diagnosis not present

## 2024-05-28 DIAGNOSIS — Z992 Dependence on renal dialysis: Secondary | ICD-10-CM | POA: Diagnosis not present

## 2024-05-28 DIAGNOSIS — D62 Acute posthemorrhagic anemia: Secondary | ICD-10-CM | POA: Diagnosis not present

## 2024-05-28 DIAGNOSIS — E876 Hypokalemia: Secondary | ICD-10-CM | POA: Diagnosis not present

## 2024-05-28 DIAGNOSIS — N185 Chronic kidney disease, stage 5: Secondary | ICD-10-CM | POA: Diagnosis not present

## 2024-05-28 DIAGNOSIS — Z7682 Awaiting organ transplant status: Secondary | ICD-10-CM | POA: Diagnosis not present

## 2024-05-28 DIAGNOSIS — Z79899 Other long term (current) drug therapy: Secondary | ICD-10-CM | POA: Diagnosis not present

## 2024-05-29 DIAGNOSIS — E876 Hypokalemia: Secondary | ICD-10-CM | POA: Diagnosis not present

## 2024-05-29 DIAGNOSIS — I120 Hypertensive chronic kidney disease with stage 5 chronic kidney disease or end stage renal disease: Secondary | ICD-10-CM | POA: Diagnosis not present

## 2024-05-29 DIAGNOSIS — Z992 Dependence on renal dialysis: Secondary | ICD-10-CM | POA: Diagnosis not present

## 2024-05-29 DIAGNOSIS — D631 Anemia in chronic kidney disease: Secondary | ICD-10-CM | POA: Diagnosis not present

## 2024-05-29 DIAGNOSIS — D649 Anemia, unspecified: Secondary | ICD-10-CM | POA: Diagnosis not present

## 2024-05-29 DIAGNOSIS — Z940 Kidney transplant status: Secondary | ICD-10-CM | POA: Diagnosis not present

## 2024-05-29 DIAGNOSIS — N185 Chronic kidney disease, stage 5: Secondary | ICD-10-CM | POA: Diagnosis not present

## 2024-05-29 DIAGNOSIS — D84821 Immunodeficiency due to drugs: Secondary | ICD-10-CM | POA: Diagnosis not present

## 2024-05-29 DIAGNOSIS — N186 End stage renal disease: Secondary | ICD-10-CM | POA: Diagnosis not present

## 2024-05-29 DIAGNOSIS — Z79621 Long term (current) use of calcineurin inhibitor: Secondary | ICD-10-CM | POA: Diagnosis not present

## 2024-05-30 DIAGNOSIS — Z940 Kidney transplant status: Secondary | ICD-10-CM | POA: Diagnosis not present

## 2024-05-30 DIAGNOSIS — D84821 Immunodeficiency due to drugs: Secondary | ICD-10-CM | POA: Diagnosis not present

## 2024-05-30 DIAGNOSIS — N186 End stage renal disease: Secondary | ICD-10-CM | POA: Diagnosis not present

## 2024-05-30 DIAGNOSIS — I120 Hypertensive chronic kidney disease with stage 5 chronic kidney disease or end stage renal disease: Secondary | ICD-10-CM | POA: Diagnosis not present

## 2024-05-30 DIAGNOSIS — Z992 Dependence on renal dialysis: Secondary | ICD-10-CM | POA: Diagnosis not present

## 2024-05-31 DIAGNOSIS — D84821 Immunodeficiency due to drugs: Secondary | ICD-10-CM | POA: Diagnosis not present

## 2024-05-31 DIAGNOSIS — Z79621 Long term (current) use of calcineurin inhibitor: Secondary | ICD-10-CM | POA: Diagnosis not present

## 2024-05-31 DIAGNOSIS — Z992 Dependence on renal dialysis: Secondary | ICD-10-CM | POA: Diagnosis not present

## 2024-05-31 DIAGNOSIS — Z940 Kidney transplant status: Secondary | ICD-10-CM | POA: Diagnosis not present

## 2024-05-31 DIAGNOSIS — E876 Hypokalemia: Secondary | ICD-10-CM | POA: Diagnosis not present

## 2024-05-31 DIAGNOSIS — N186 End stage renal disease: Secondary | ICD-10-CM | POA: Diagnosis not present

## 2024-05-31 DIAGNOSIS — D649 Anemia, unspecified: Secondary | ICD-10-CM | POA: Diagnosis not present

## 2024-05-31 DIAGNOSIS — I120 Hypertensive chronic kidney disease with stage 5 chronic kidney disease or end stage renal disease: Secondary | ICD-10-CM | POA: Diagnosis not present

## 2024-05-31 DIAGNOSIS — D631 Anemia in chronic kidney disease: Secondary | ICD-10-CM | POA: Diagnosis not present

## 2024-06-04 DIAGNOSIS — Z4822 Encounter for aftercare following kidney transplant: Secondary | ICD-10-CM | POA: Diagnosis not present

## 2024-06-04 DIAGNOSIS — Z79621 Long term (current) use of calcineurin inhibitor: Secondary | ICD-10-CM | POA: Diagnosis not present

## 2024-06-04 DIAGNOSIS — D508 Other iron deficiency anemias: Secondary | ICD-10-CM | POA: Diagnosis not present

## 2024-06-04 DIAGNOSIS — R9431 Abnormal electrocardiogram [ECG] [EKG]: Secondary | ICD-10-CM | POA: Diagnosis not present

## 2024-06-04 DIAGNOSIS — E7800 Pure hypercholesterolemia, unspecified: Secondary | ICD-10-CM | POA: Diagnosis not present

## 2024-06-04 DIAGNOSIS — I10 Essential (primary) hypertension: Secondary | ICD-10-CM | POA: Diagnosis not present

## 2024-06-04 DIAGNOSIS — E213 Hyperparathyroidism, unspecified: Secondary | ICD-10-CM | POA: Diagnosis not present

## 2024-06-04 DIAGNOSIS — D849 Immunodeficiency, unspecified: Secondary | ICD-10-CM | POA: Diagnosis not present

## 2024-06-04 DIAGNOSIS — Z79899 Other long term (current) drug therapy: Secondary | ICD-10-CM | POA: Diagnosis not present

## 2024-06-04 DIAGNOSIS — Z9483 Pancreas transplant status: Secondary | ICD-10-CM | POA: Diagnosis not present

## 2024-06-04 DIAGNOSIS — E860 Dehydration: Secondary | ICD-10-CM | POA: Diagnosis not present

## 2024-06-04 DIAGNOSIS — Z940 Kidney transplant status: Secondary | ICD-10-CM | POA: Diagnosis not present

## 2024-06-05 DIAGNOSIS — I129 Hypertensive chronic kidney disease with stage 1 through stage 4 chronic kidney disease, or unspecified chronic kidney disease: Secondary | ICD-10-CM | POA: Diagnosis not present

## 2024-06-05 DIAGNOSIS — M810 Age-related osteoporosis without current pathological fracture: Secondary | ICD-10-CM | POA: Diagnosis not present

## 2024-06-05 DIAGNOSIS — Z992 Dependence on renal dialysis: Secondary | ICD-10-CM | POA: Diagnosis not present

## 2024-06-05 DIAGNOSIS — M1712 Unilateral primary osteoarthritis, left knee: Secondary | ICD-10-CM | POA: Diagnosis not present

## 2024-06-05 DIAGNOSIS — N186 End stage renal disease: Secondary | ICD-10-CM | POA: Diagnosis not present

## 2024-06-06 DIAGNOSIS — Z4822 Encounter for aftercare following kidney transplant: Secondary | ICD-10-CM | POA: Diagnosis not present

## 2024-06-06 DIAGNOSIS — Z79621 Long term (current) use of calcineurin inhibitor: Secondary | ICD-10-CM | POA: Diagnosis not present

## 2024-06-06 DIAGNOSIS — Z940 Kidney transplant status: Secondary | ICD-10-CM | POA: Diagnosis not present

## 2024-06-06 DIAGNOSIS — R197 Diarrhea, unspecified: Secondary | ICD-10-CM | POA: Diagnosis not present

## 2024-06-06 DIAGNOSIS — Z5181 Encounter for therapeutic drug level monitoring: Secondary | ICD-10-CM | POA: Diagnosis not present

## 2024-06-09 DIAGNOSIS — D508 Other iron deficiency anemias: Secondary | ICD-10-CM | POA: Diagnosis not present

## 2024-06-09 DIAGNOSIS — I10 Essential (primary) hypertension: Secondary | ICD-10-CM | POA: Diagnosis not present

## 2024-06-09 DIAGNOSIS — E559 Vitamin D deficiency, unspecified: Secondary | ICD-10-CM | POA: Diagnosis not present

## 2024-06-09 DIAGNOSIS — Z4822 Encounter for aftercare following kidney transplant: Secondary | ICD-10-CM | POA: Diagnosis not present

## 2024-06-09 DIAGNOSIS — D849 Immunodeficiency, unspecified: Secondary | ICD-10-CM | POA: Diagnosis not present

## 2024-06-09 DIAGNOSIS — R197 Diarrhea, unspecified: Secondary | ICD-10-CM | POA: Diagnosis not present

## 2024-06-09 DIAGNOSIS — E785 Hyperlipidemia, unspecified: Secondary | ICD-10-CM | POA: Diagnosis not present

## 2024-06-09 DIAGNOSIS — Z79621 Long term (current) use of calcineurin inhibitor: Secondary | ICD-10-CM | POA: Diagnosis not present

## 2024-06-09 DIAGNOSIS — E213 Hyperparathyroidism, unspecified: Secondary | ICD-10-CM | POA: Diagnosis not present

## 2024-06-09 DIAGNOSIS — Z940 Kidney transplant status: Secondary | ICD-10-CM | POA: Diagnosis not present

## 2024-06-09 DIAGNOSIS — Z79899 Other long term (current) drug therapy: Secondary | ICD-10-CM | POA: Diagnosis not present

## 2024-06-09 DIAGNOSIS — R638 Other symptoms and signs concerning food and fluid intake: Secondary | ICD-10-CM | POA: Diagnosis not present

## 2024-06-09 DIAGNOSIS — Z2989 Encounter for other specified prophylactic measures: Secondary | ICD-10-CM | POA: Diagnosis not present

## 2024-06-09 DIAGNOSIS — Z5181 Encounter for therapeutic drug level monitoring: Secondary | ICD-10-CM | POA: Diagnosis not present

## 2024-06-10 DIAGNOSIS — N186 End stage renal disease: Secondary | ICD-10-CM | POA: Diagnosis not present

## 2024-06-12 DIAGNOSIS — R197 Diarrhea, unspecified: Secondary | ICD-10-CM | POA: Diagnosis not present

## 2024-06-12 DIAGNOSIS — Z5181 Encounter for therapeutic drug level monitoring: Secondary | ICD-10-CM | POA: Diagnosis not present

## 2024-06-12 DIAGNOSIS — Z940 Kidney transplant status: Secondary | ICD-10-CM | POA: Diagnosis not present

## 2024-06-12 DIAGNOSIS — Z2989 Encounter for other specified prophylactic measures: Secondary | ICD-10-CM | POA: Diagnosis not present

## 2024-06-12 DIAGNOSIS — Z79899 Other long term (current) drug therapy: Secondary | ICD-10-CM | POA: Diagnosis not present

## 2024-06-12 DIAGNOSIS — D508 Other iron deficiency anemias: Secondary | ICD-10-CM | POA: Diagnosis not present

## 2024-06-12 DIAGNOSIS — I10 Essential (primary) hypertension: Secondary | ICD-10-CM | POA: Diagnosis not present

## 2024-06-12 DIAGNOSIS — Z79621 Long term (current) use of calcineurin inhibitor: Secondary | ICD-10-CM | POA: Diagnosis not present

## 2024-06-12 DIAGNOSIS — R638 Other symptoms and signs concerning food and fluid intake: Secondary | ICD-10-CM | POA: Diagnosis not present

## 2024-06-12 DIAGNOSIS — Z4822 Encounter for aftercare following kidney transplant: Secondary | ICD-10-CM | POA: Diagnosis not present

## 2024-06-12 DIAGNOSIS — D849 Immunodeficiency, unspecified: Secondary | ICD-10-CM | POA: Diagnosis not present

## 2024-06-16 DIAGNOSIS — E559 Vitamin D deficiency, unspecified: Secondary | ICD-10-CM | POA: Diagnosis not present

## 2024-06-16 DIAGNOSIS — Z2989 Encounter for other specified prophylactic measures: Secondary | ICD-10-CM | POA: Diagnosis not present

## 2024-06-16 DIAGNOSIS — Z4822 Encounter for aftercare following kidney transplant: Secondary | ICD-10-CM | POA: Diagnosis not present

## 2024-06-16 DIAGNOSIS — Z79621 Long term (current) use of calcineurin inhibitor: Secondary | ICD-10-CM | POA: Diagnosis not present

## 2024-06-16 DIAGNOSIS — R638 Other symptoms and signs concerning food and fluid intake: Secondary | ICD-10-CM | POA: Diagnosis not present

## 2024-06-16 DIAGNOSIS — D508 Other iron deficiency anemias: Secondary | ICD-10-CM | POA: Diagnosis not present

## 2024-06-16 DIAGNOSIS — Z940 Kidney transplant status: Secondary | ICD-10-CM | POA: Diagnosis not present

## 2024-06-16 DIAGNOSIS — Z5181 Encounter for therapeutic drug level monitoring: Secondary | ICD-10-CM | POA: Diagnosis not present

## 2024-06-16 DIAGNOSIS — Z79899 Other long term (current) drug therapy: Secondary | ICD-10-CM | POA: Diagnosis not present

## 2024-06-16 DIAGNOSIS — R197 Diarrhea, unspecified: Secondary | ICD-10-CM | POA: Diagnosis not present

## 2024-06-16 DIAGNOSIS — I10 Essential (primary) hypertension: Secondary | ICD-10-CM | POA: Diagnosis not present

## 2024-06-16 DIAGNOSIS — D849 Immunodeficiency, unspecified: Secondary | ICD-10-CM | POA: Diagnosis not present

## 2024-06-19 DIAGNOSIS — D508 Other iron deficiency anemias: Secondary | ICD-10-CM | POA: Diagnosis not present

## 2024-06-19 DIAGNOSIS — N3000 Acute cystitis without hematuria: Secondary | ICD-10-CM | POA: Diagnosis not present

## 2024-06-19 DIAGNOSIS — D638 Anemia in other chronic diseases classified elsewhere: Secondary | ICD-10-CM | POA: Diagnosis not present

## 2024-06-19 DIAGNOSIS — Z940 Kidney transplant status: Secondary | ICD-10-CM | POA: Diagnosis not present

## 2024-06-19 DIAGNOSIS — B9689 Other specified bacterial agents as the cause of diseases classified elsewhere: Secondary | ICD-10-CM | POA: Diagnosis not present

## 2024-06-19 DIAGNOSIS — Z5181 Encounter for therapeutic drug level monitoring: Secondary | ICD-10-CM | POA: Diagnosis not present

## 2024-06-19 DIAGNOSIS — Z2989 Encounter for other specified prophylactic measures: Secondary | ICD-10-CM | POA: Diagnosis not present

## 2024-06-19 DIAGNOSIS — N390 Urinary tract infection, site not specified: Secondary | ICD-10-CM | POA: Diagnosis not present

## 2024-06-19 DIAGNOSIS — K521 Toxic gastroenteritis and colitis: Secondary | ICD-10-CM | POA: Diagnosis not present

## 2024-06-19 DIAGNOSIS — I10 Essential (primary) hypertension: Secondary | ICD-10-CM | POA: Diagnosis not present

## 2024-06-19 DIAGNOSIS — Z79899 Other long term (current) drug therapy: Secondary | ICD-10-CM | POA: Diagnosis not present

## 2024-06-19 DIAGNOSIS — Z4822 Encounter for aftercare following kidney transplant: Secondary | ICD-10-CM | POA: Diagnosis not present

## 2024-06-19 DIAGNOSIS — D702 Other drug-induced agranulocytosis: Secondary | ICD-10-CM | POA: Diagnosis not present

## 2024-06-19 DIAGNOSIS — Z789 Other specified health status: Secondary | ICD-10-CM | POA: Diagnosis not present

## 2024-06-19 DIAGNOSIS — E213 Hyperparathyroidism, unspecified: Secondary | ICD-10-CM | POA: Diagnosis not present

## 2024-06-19 DIAGNOSIS — E559 Vitamin D deficiency, unspecified: Secondary | ICD-10-CM | POA: Diagnosis not present

## 2024-06-19 DIAGNOSIS — Z79621 Long term (current) use of calcineurin inhibitor: Secondary | ICD-10-CM | POA: Diagnosis not present

## 2024-06-23 DIAGNOSIS — Z960 Presence of urogenital implants: Secondary | ICD-10-CM | POA: Diagnosis not present

## 2024-06-23 DIAGNOSIS — Z940 Kidney transplant status: Secondary | ICD-10-CM | POA: Diagnosis not present

## 2024-06-23 DIAGNOSIS — D638 Anemia in other chronic diseases classified elsewhere: Secondary | ICD-10-CM | POA: Diagnosis not present

## 2024-06-23 DIAGNOSIS — I10 Essential (primary) hypertension: Secondary | ICD-10-CM | POA: Diagnosis not present

## 2024-06-23 DIAGNOSIS — K521 Toxic gastroenteritis and colitis: Secondary | ICD-10-CM | POA: Diagnosis not present

## 2024-06-23 DIAGNOSIS — Z5181 Encounter for therapeutic drug level monitoring: Secondary | ICD-10-CM | POA: Diagnosis not present

## 2024-06-23 DIAGNOSIS — D849 Immunodeficiency, unspecified: Secondary | ICD-10-CM | POA: Diagnosis not present

## 2024-06-23 DIAGNOSIS — Z4822 Encounter for aftercare following kidney transplant: Secondary | ICD-10-CM | POA: Diagnosis not present

## 2024-06-23 DIAGNOSIS — E559 Vitamin D deficiency, unspecified: Secondary | ICD-10-CM | POA: Diagnosis not present

## 2024-06-23 DIAGNOSIS — Z79621 Long term (current) use of calcineurin inhibitor: Secondary | ICD-10-CM | POA: Diagnosis not present

## 2024-06-23 DIAGNOSIS — E611 Iron deficiency: Secondary | ICD-10-CM | POA: Diagnosis not present

## 2024-06-23 DIAGNOSIS — E7800 Pure hypercholesterolemia, unspecified: Secondary | ICD-10-CM | POA: Diagnosis not present

## 2024-06-23 DIAGNOSIS — E213 Hyperparathyroidism, unspecified: Secondary | ICD-10-CM | POA: Diagnosis not present

## 2024-06-23 DIAGNOSIS — Z79899 Other long term (current) drug therapy: Secondary | ICD-10-CM | POA: Diagnosis not present

## 2024-06-24 DIAGNOSIS — R531 Weakness: Secondary | ICD-10-CM | POA: Diagnosis not present

## 2024-06-24 DIAGNOSIS — R29898 Other symptoms and signs involving the musculoskeletal system: Secondary | ICD-10-CM | POA: Diagnosis not present

## 2024-06-26 DIAGNOSIS — Z79621 Long term (current) use of calcineurin inhibitor: Secondary | ICD-10-CM | POA: Diagnosis not present

## 2024-06-26 DIAGNOSIS — Z960 Presence of urogenital implants: Secondary | ICD-10-CM | POA: Diagnosis not present

## 2024-06-26 DIAGNOSIS — Z79899 Other long term (current) drug therapy: Secondary | ICD-10-CM | POA: Diagnosis not present

## 2024-06-26 DIAGNOSIS — R197 Diarrhea, unspecified: Secondary | ICD-10-CM | POA: Diagnosis not present

## 2024-06-26 DIAGNOSIS — Z2989 Encounter for other specified prophylactic measures: Secondary | ICD-10-CM | POA: Diagnosis not present

## 2024-06-26 DIAGNOSIS — Z940 Kidney transplant status: Secondary | ICD-10-CM | POA: Diagnosis not present

## 2024-06-26 DIAGNOSIS — E611 Iron deficiency: Secondary | ICD-10-CM | POA: Diagnosis not present

## 2024-06-26 DIAGNOSIS — Z13220 Encounter for screening for lipoid disorders: Secondary | ICD-10-CM | POA: Diagnosis not present

## 2024-06-26 DIAGNOSIS — Z466 Encounter for fitting and adjustment of urinary device: Secondary | ICD-10-CM | POA: Diagnosis not present

## 2024-06-26 DIAGNOSIS — E785 Hyperlipidemia, unspecified: Secondary | ICD-10-CM | POA: Diagnosis not present

## 2024-06-26 DIAGNOSIS — Z789 Other specified health status: Secondary | ICD-10-CM | POA: Diagnosis not present

## 2024-06-26 DIAGNOSIS — I10 Essential (primary) hypertension: Secondary | ICD-10-CM | POA: Diagnosis not present

## 2024-06-26 DIAGNOSIS — K521 Toxic gastroenteritis and colitis: Secondary | ICD-10-CM | POA: Diagnosis not present

## 2024-06-26 DIAGNOSIS — D649 Anemia, unspecified: Secondary | ICD-10-CM | POA: Diagnosis not present

## 2024-06-26 DIAGNOSIS — E559 Vitamin D deficiency, unspecified: Secondary | ICD-10-CM | POA: Diagnosis not present

## 2024-06-26 DIAGNOSIS — Z5181 Encounter for therapeutic drug level monitoring: Secondary | ICD-10-CM | POA: Diagnosis not present

## 2024-06-26 DIAGNOSIS — D72828 Other elevated white blood cell count: Secondary | ICD-10-CM | POA: Diagnosis not present

## 2024-06-26 DIAGNOSIS — Z4822 Encounter for aftercare following kidney transplant: Secondary | ICD-10-CM | POA: Diagnosis not present

## 2024-07-02 DIAGNOSIS — R531 Weakness: Secondary | ICD-10-CM | POA: Diagnosis not present

## 2024-07-02 DIAGNOSIS — R29898 Other symptoms and signs involving the musculoskeletal system: Secondary | ICD-10-CM | POA: Diagnosis not present

## 2024-07-03 DIAGNOSIS — E785 Hyperlipidemia, unspecified: Secondary | ICD-10-CM | POA: Diagnosis not present

## 2024-07-03 DIAGNOSIS — D649 Anemia, unspecified: Secondary | ICD-10-CM | POA: Diagnosis not present

## 2024-07-03 DIAGNOSIS — Z789 Other specified health status: Secondary | ICD-10-CM | POA: Diagnosis not present

## 2024-07-03 DIAGNOSIS — Z940 Kidney transplant status: Secondary | ICD-10-CM | POA: Diagnosis not present

## 2024-07-03 DIAGNOSIS — Z79899 Other long term (current) drug therapy: Secondary | ICD-10-CM | POA: Diagnosis not present

## 2024-07-03 DIAGNOSIS — Z5181 Encounter for therapeutic drug level monitoring: Secondary | ICD-10-CM | POA: Diagnosis not present

## 2024-07-03 DIAGNOSIS — D849 Immunodeficiency, unspecified: Secondary | ICD-10-CM | POA: Diagnosis not present

## 2024-07-03 DIAGNOSIS — R197 Diarrhea, unspecified: Secondary | ICD-10-CM | POA: Diagnosis not present

## 2024-07-03 DIAGNOSIS — Z4822 Encounter for aftercare following kidney transplant: Secondary | ICD-10-CM | POA: Diagnosis not present

## 2024-07-03 DIAGNOSIS — E559 Vitamin D deficiency, unspecified: Secondary | ICD-10-CM | POA: Diagnosis not present

## 2024-07-03 DIAGNOSIS — Z2989 Encounter for other specified prophylactic measures: Secondary | ICD-10-CM | POA: Diagnosis not present

## 2024-07-03 DIAGNOSIS — Z79621 Long term (current) use of calcineurin inhibitor: Secondary | ICD-10-CM | POA: Diagnosis not present

## 2024-07-03 DIAGNOSIS — R638 Other symptoms and signs concerning food and fluid intake: Secondary | ICD-10-CM | POA: Diagnosis not present

## 2024-07-03 DIAGNOSIS — K219 Gastro-esophageal reflux disease without esophagitis: Secondary | ICD-10-CM | POA: Diagnosis not present

## 2024-07-03 DIAGNOSIS — E611 Iron deficiency: Secondary | ICD-10-CM | POA: Diagnosis not present

## 2024-07-03 DIAGNOSIS — I10 Essential (primary) hypertension: Secondary | ICD-10-CM | POA: Diagnosis not present

## 2024-07-03 DIAGNOSIS — E878 Other disorders of electrolyte and fluid balance, not elsewhere classified: Secondary | ICD-10-CM | POA: Diagnosis not present

## 2024-07-06 DIAGNOSIS — M1712 Unilateral primary osteoarthritis, left knee: Secondary | ICD-10-CM | POA: Diagnosis not present

## 2024-07-06 DIAGNOSIS — M810 Age-related osteoporosis without current pathological fracture: Secondary | ICD-10-CM | POA: Diagnosis not present

## 2024-07-06 DIAGNOSIS — N186 End stage renal disease: Secondary | ICD-10-CM | POA: Diagnosis not present

## 2024-07-06 DIAGNOSIS — I129 Hypertensive chronic kidney disease with stage 1 through stage 4 chronic kidney disease, or unspecified chronic kidney disease: Secondary | ICD-10-CM | POA: Diagnosis not present

## 2024-07-09 DIAGNOSIS — R531 Weakness: Secondary | ICD-10-CM | POA: Diagnosis not present

## 2024-07-09 DIAGNOSIS — R29898 Other symptoms and signs involving the musculoskeletal system: Secondary | ICD-10-CM | POA: Diagnosis not present

## 2024-07-10 DIAGNOSIS — I10 Essential (primary) hypertension: Secondary | ICD-10-CM | POA: Diagnosis not present

## 2024-07-10 DIAGNOSIS — Z5181 Encounter for therapeutic drug level monitoring: Secondary | ICD-10-CM | POA: Diagnosis not present

## 2024-07-10 DIAGNOSIS — Z2989 Encounter for other specified prophylactic measures: Secondary | ICD-10-CM | POA: Diagnosis not present

## 2024-07-10 DIAGNOSIS — Z789 Other specified health status: Secondary | ICD-10-CM | POA: Diagnosis not present

## 2024-07-10 DIAGNOSIS — R638 Other symptoms and signs concerning food and fluid intake: Secondary | ICD-10-CM | POA: Diagnosis not present

## 2024-07-10 DIAGNOSIS — Z79621 Long term (current) use of calcineurin inhibitor: Secondary | ICD-10-CM | POA: Diagnosis not present

## 2024-07-10 DIAGNOSIS — Z4822 Encounter for aftercare following kidney transplant: Secondary | ICD-10-CM | POA: Diagnosis not present

## 2024-07-10 DIAGNOSIS — D849 Immunodeficiency, unspecified: Secondary | ICD-10-CM | POA: Diagnosis not present

## 2024-07-10 DIAGNOSIS — Z940 Kidney transplant status: Secondary | ICD-10-CM | POA: Diagnosis not present

## 2024-07-10 DIAGNOSIS — Z79899 Other long term (current) drug therapy: Secondary | ICD-10-CM | POA: Diagnosis not present

## 2024-07-17 DIAGNOSIS — Z789 Other specified health status: Secondary | ICD-10-CM | POA: Diagnosis not present

## 2024-07-17 DIAGNOSIS — R638 Other symptoms and signs concerning food and fluid intake: Secondary | ICD-10-CM | POA: Diagnosis not present

## 2024-07-17 DIAGNOSIS — Z940 Kidney transplant status: Secondary | ICD-10-CM | POA: Diagnosis not present

## 2024-07-17 DIAGNOSIS — D849 Immunodeficiency, unspecified: Secondary | ICD-10-CM | POA: Diagnosis not present

## 2024-07-17 DIAGNOSIS — Z5181 Encounter for therapeutic drug level monitoring: Secondary | ICD-10-CM | POA: Diagnosis not present

## 2024-07-17 DIAGNOSIS — Z2989 Encounter for other specified prophylactic measures: Secondary | ICD-10-CM | POA: Diagnosis not present

## 2024-07-17 DIAGNOSIS — Z4822 Encounter for aftercare following kidney transplant: Secondary | ICD-10-CM | POA: Diagnosis not present

## 2024-07-17 DIAGNOSIS — I10 Essential (primary) hypertension: Secondary | ICD-10-CM | POA: Diagnosis not present

## 2024-07-17 DIAGNOSIS — R197 Diarrhea, unspecified: Secondary | ICD-10-CM | POA: Diagnosis not present

## 2024-07-24 DIAGNOSIS — I10 Essential (primary) hypertension: Secondary | ICD-10-CM | POA: Diagnosis not present

## 2024-07-24 DIAGNOSIS — Z4822 Encounter for aftercare following kidney transplant: Secondary | ICD-10-CM | POA: Diagnosis not present

## 2024-07-24 DIAGNOSIS — A498 Other bacterial infections of unspecified site: Secondary | ICD-10-CM | POA: Diagnosis not present

## 2024-07-24 DIAGNOSIS — N3000 Acute cystitis without hematuria: Secondary | ICD-10-CM | POA: Diagnosis not present

## 2024-07-24 DIAGNOSIS — D849 Immunodeficiency, unspecified: Secondary | ICD-10-CM | POA: Diagnosis not present

## 2024-07-24 DIAGNOSIS — Z940 Kidney transplant status: Secondary | ICD-10-CM | POA: Diagnosis not present

## 2024-07-24 DIAGNOSIS — R300 Dysuria: Secondary | ICD-10-CM | POA: Diagnosis not present

## 2024-07-24 DIAGNOSIS — R202 Paresthesia of skin: Secondary | ICD-10-CM | POA: Diagnosis not present

## 2024-07-24 DIAGNOSIS — T82898D Other specified complication of vascular prosthetic devices, implants and grafts, subsequent encounter: Secondary | ICD-10-CM | POA: Diagnosis not present

## 2024-07-31 DIAGNOSIS — R413 Other amnesia: Secondary | ICD-10-CM | POA: Diagnosis not present

## 2024-07-31 DIAGNOSIS — I129 Hypertensive chronic kidney disease with stage 1 through stage 4 chronic kidney disease, or unspecified chronic kidney disease: Secondary | ICD-10-CM | POA: Diagnosis not present

## 2024-08-04 ENCOUNTER — Encounter: Payer: Self-pay | Admitting: Vascular Surgery

## 2024-08-05 DIAGNOSIS — M1712 Unilateral primary osteoarthritis, left knee: Secondary | ICD-10-CM | POA: Diagnosis not present

## 2024-08-05 DIAGNOSIS — N186 End stage renal disease: Secondary | ICD-10-CM | POA: Diagnosis not present

## 2024-08-05 DIAGNOSIS — M810 Age-related osteoporosis without current pathological fracture: Secondary | ICD-10-CM | POA: Diagnosis not present

## 2024-08-05 DIAGNOSIS — I129 Hypertensive chronic kidney disease with stage 1 through stage 4 chronic kidney disease, or unspecified chronic kidney disease: Secondary | ICD-10-CM | POA: Diagnosis not present

## 2024-08-07 DIAGNOSIS — I151 Hypertension secondary to other renal disorders: Secondary | ICD-10-CM | POA: Diagnosis not present

## 2024-08-07 DIAGNOSIS — Z940 Kidney transplant status: Secondary | ICD-10-CM | POA: Diagnosis not present

## 2024-08-07 DIAGNOSIS — Z79621 Long term (current) use of calcineurin inhibitor: Secondary | ICD-10-CM | POA: Diagnosis not present

## 2024-08-07 DIAGNOSIS — I6783 Posterior reversible encephalopathy syndrome: Secondary | ICD-10-CM | POA: Diagnosis not present

## 2024-08-07 DIAGNOSIS — T82898D Other specified complication of vascular prosthetic devices, implants and grafts, subsequent encounter: Secondary | ICD-10-CM | POA: Diagnosis not present

## 2024-08-07 DIAGNOSIS — Z79899 Other long term (current) drug therapy: Secondary | ICD-10-CM | POA: Diagnosis not present

## 2024-08-07 DIAGNOSIS — N185 Chronic kidney disease, stage 5: Secondary | ICD-10-CM | POA: Diagnosis not present

## 2024-08-07 DIAGNOSIS — Z5181 Encounter for therapeutic drug level monitoring: Secondary | ICD-10-CM | POA: Diagnosis not present

## 2024-08-11 ENCOUNTER — Other Ambulatory Visit: Payer: Self-pay | Admitting: Family Medicine

## 2024-08-11 DIAGNOSIS — Z1231 Encounter for screening mammogram for malignant neoplasm of breast: Secondary | ICD-10-CM

## 2024-08-21 ENCOUNTER — Telehealth: Payer: Self-pay

## 2024-08-21 DIAGNOSIS — Z940 Kidney transplant status: Secondary | ICD-10-CM | POA: Diagnosis not present

## 2024-08-21 DIAGNOSIS — Z2989 Encounter for other specified prophylactic measures: Secondary | ICD-10-CM | POA: Diagnosis not present

## 2024-08-21 DIAGNOSIS — Z4822 Encounter for aftercare following kidney transplant: Secondary | ICD-10-CM | POA: Diagnosis not present

## 2024-08-21 DIAGNOSIS — R202 Paresthesia of skin: Secondary | ICD-10-CM | POA: Diagnosis not present

## 2024-08-21 DIAGNOSIS — D508 Other iron deficiency anemias: Secondary | ICD-10-CM | POA: Diagnosis not present

## 2024-08-21 DIAGNOSIS — Z79899 Other long term (current) drug therapy: Secondary | ICD-10-CM | POA: Diagnosis not present

## 2024-08-21 DIAGNOSIS — Z79621 Long term (current) use of calcineurin inhibitor: Secondary | ICD-10-CM | POA: Diagnosis not present

## 2024-08-21 DIAGNOSIS — D702 Other drug-induced agranulocytosis: Secondary | ICD-10-CM | POA: Diagnosis not present

## 2024-08-21 DIAGNOSIS — Z5181 Encounter for therapeutic drug level monitoring: Secondary | ICD-10-CM | POA: Diagnosis not present

## 2024-08-21 DIAGNOSIS — Z789 Other specified health status: Secondary | ICD-10-CM | POA: Diagnosis not present

## 2024-08-21 DIAGNOSIS — D849 Immunodeficiency, unspecified: Secondary | ICD-10-CM | POA: Diagnosis not present

## 2024-08-21 DIAGNOSIS — I10 Essential (primary) hypertension: Secondary | ICD-10-CM | POA: Diagnosis not present

## 2024-08-21 DIAGNOSIS — T82898D Other specified complication of vascular prosthetic devices, implants and grafts, subsequent encounter: Secondary | ICD-10-CM | POA: Diagnosis not present

## 2024-08-21 DIAGNOSIS — Z299 Encounter for prophylactic measures, unspecified: Secondary | ICD-10-CM | POA: Diagnosis not present

## 2024-08-21 DIAGNOSIS — E559 Vitamin D deficiency, unspecified: Secondary | ICD-10-CM | POA: Diagnosis not present

## 2024-08-21 NOTE — Telephone Encounter (Signed)
 Patient called asking for appt for Dr. Pearline to check her AVG for narrowing. Patient had kidney transplant in 7/25.  Post-transplant doctor checked her AVG and told pt that it was narrow. Advised pt to ask post-transplant provider for referral back to [facility] .

## 2024-08-22 DIAGNOSIS — Z940 Kidney transplant status: Secondary | ICD-10-CM | POA: Diagnosis not present

## 2024-08-25 ENCOUNTER — Other Ambulatory Visit: Payer: Self-pay

## 2024-08-25 DIAGNOSIS — N186 End stage renal disease: Secondary | ICD-10-CM

## 2024-09-08 ENCOUNTER — Encounter: Payer: Self-pay | Admitting: Physician Assistant

## 2024-09-08 ENCOUNTER — Ambulatory Visit (HOSPITAL_COMMUNITY)
Admission: RE | Admit: 2024-09-08 | Discharge: 2024-09-08 | Disposition: A | Discharge status: Home / Self Care | Source: Ambulatory Visit | Attending: Surgery | Admitting: Surgery

## 2024-09-08 ENCOUNTER — Encounter: Payer: Self-pay | Admitting: Radiology

## 2024-09-08 ENCOUNTER — Ambulatory Visit: Attending: Surgery | Admitting: Physician Assistant

## 2024-09-08 VITALS — BP 165/76 | HR 88 | Temp 97.7°F | Wt 224.0 lb

## 2024-09-08 DIAGNOSIS — N186 End stage renal disease: Secondary | ICD-10-CM | POA: Insufficient documentation

## 2024-09-08 NOTE — Progress Notes (Signed)
 Established Dialysis Access   History of Present Illness   Christina Vasquez is a 68 y.o. (09-30-1956) female who presents for evaluation of possible narrowing in her right AV graft causing tingling in her right hand. 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 At one of her follow up appointment she has explained to the Nephrologist about her finger tingling so she had a duplex ordered with questionable narrowing in her graft. She is here today to have it evaluated. 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 She says however recently she has noticed worsening when her fingers get cold. Generally like she she is washing her hands or if she picks up a can or glass that is cold. She says the fingers have a tingling/ burning sensation. It is resolved once she is no longer exposing her finger tips to the cold. No other signs or symptoms of steal. 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  She is not longer on HD since her transplant.   Current Outpatient Medications  Medication Sig Dispense Refill   acetaminophen  (TYLENOL ) 650 MG CR tablet Take 1,300 mg by mouth every 8 (eight) hours as needed for pain.     amLODipine  (NORVASC ) 10 MG tablet Take 10 mg by mouth daily.     azaTHIOprine (IMURAN) 50 MG tablet Take 100 mg by mouth.     carvedilol  (COREG ) 25 MG tablet Take 1 tablet (25 mg total) by mouth 2 (two) times daily with a meal. Do not take on days before dialysis Do not take after dialysis if systolic blood pressure is less than 110. 60 tablet 2   cholecalciferol (VITAMIN D3) 25 MCG (1000 UNIT) tablet Take 2,000 Units by mouth.     cinacalcet  (SENSIPAR ) 30 MG tablet Take 30 mg by mouth every Monday, Wednesday, and Friday.     gabapentin  (NEURONTIN ) 100 MG capsule Take 2 capsules (200 mg total)  by mouth 3 (three) times daily. 180 capsule 5   magnesium chloride (SLOW-MAG) 64 MG TBEC SR tablet Take 71.5 mg by mouth.     NIFEdipine (ADALAT CC) 90 MG 24 hr tablet Take 90 mg by mouth.     omeprazole (PRILOSEC) 40 MG capsule Take 40 mg by mouth daily.     predniSONE (DELTASONE) 5 MG tablet Take 5 mg by mouth daily.     sucroferric oxyhydroxide (VELPHORO ) 500 MG chewable tablet Chew 1,000-1,500 mg by mouth See admin instructions. Take 1500 mg with each meal and 1000 mg with each snack     sulfamethoxazole-trimethoprim (BACTRIM) 400-80 MG tablet Take 1 tablet by mouth 3 (three) times a week.     tacrolimus (PROGRAF) 1 MG capsule Take by mouth.     venlafaxine  XR (EFFEXOR -XR) 37.5 MG 24 hr capsule Take 37.5 mg by mouth daily with breakfast. (Patient taking differently: Take 75 mg by mouth daily with breakfast.)     VITAMIN D PO Take 5 capsules by mouth every Monday, Wednesday, and Friday with hemodialysis.     oxyCODONE -acetaminophen  (PERCOCET) 5-325 MG tablet Take 1 tablet by mouth every 6 (six) hours as needed for severe pain (pain score 7-10). 20 tablet 0   Semaglutide,0.25 or 0.5MG /DOS, (OZEMPIC, 0.25 OR 0.5 MG/DOSE,) 2 MG/1.5ML SOPN Inject 0.5 mg into the  skin once a week.     No current facility-administered medications for this visit.    On ROS today: negative unless stated in HPI   Physical Examination   Vitals:   09/08/24 0951  BP: (!) 165/76  Pulse: 88  Temp: 97.7 F (36.5 C)  TempSrc: Temporal  Weight: 224 lb (101.6 kg)   Body mass index is 40.97 kg/m.  General WDWN in no distress  Pulmonary Non labored  Cardiac regular  Vascular Right AV graft with great thrill, no pulsatility.2+ right radial pulse, hand warm and well perfused   Musculo- skeletal M/S 5/5 throughout  , Extremities without ischemic changes    Neurologic A&O; CN grossly intact     Non-invasive Vascular Imaging   right Arm Access Duplex  (09/08/24):  Findings:   +--------------------+----------+-----------------+--------+  AVG                PSV (cm/s)Flow Vol (mL/min)Describe  +--------------------+----------+-----------------+--------+  Native artery inflow   127          1136                 +--------------------+----------+-----------------+--------+  Arterial anastomosis   181                               +--------------------+----------+-----------------+--------+  Prox graft             359                               +--------------------+----------+-----------------+--------+  Mid graft              261                               +--------------------+----------+-----------------+--------+  Distal graft           285                               +--------------------+----------+-----------------+--------+  Venous anastomosis     435                               +--------------------+----------+-----------------+--------+  Venous outflow         469                               +--------------------+----------+-----------------+--------+     Summary:  Patent right brachial-axillary vein AVG creation. Questioning first attempt of graft in the distal upper arm, distal to the brachial-axillary vein AVG anastomosis, unable to follow short graft segment.   Medical Decision Making   Christina Vasquez is a 68 y.o. female who presents with tingling in her right fingertips and questionable narrowing in the AV Graft. Her symptoms have been present for a while but they are worsened by cold exposure. She had a duplex at [facility] but it appeared to be evaluating an old fistula. Her duplex today shows patent right AV graft without stenosis. I do not suspect this is related to her symptoms. 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 She has a palpable radial pulse and her hand is warm.  I have encouraged her to try to avoid cold exposure to her fingers  as best as possible to help alleviate this. At this time she is not indicated for any intervention. We discussed importance of keeping her access at this time with a new Renal transplant such that should she have any issues with her transplant she has access to use for dialysis. There is also no indication for a fistulogram. I would caution this anyways with a renal transplant. She is comfortable with this plan. She will call if she has any new or worsening symptoms.   Teretha Damme, PA-C [facility] [place] Office: [phone]  Clinic MD: [place]

## 2024-09-16 ENCOUNTER — Ambulatory Visit
Admission: RE | Admit: 2024-09-16 | Discharge: 2024-09-16 | Disposition: A | Discharge status: Home / Self Care | Source: Ambulatory Visit | Attending: Family Medicine | Admitting: Family Medicine

## 2024-09-16 DIAGNOSIS — Z1231 Encounter for screening mammogram for malignant neoplasm of breast: Secondary | ICD-10-CM

## 2024-09-17 ENCOUNTER — Other Ambulatory Visit: Payer: Self-pay | Admitting: Family Medicine

## 2024-09-17 ENCOUNTER — Other Ambulatory Visit: Payer: Self-pay

## 2024-09-17 ENCOUNTER — Emergency Department (HOSPITAL_BASED_OUTPATIENT_CLINIC_OR_DEPARTMENT_OTHER)
Admission: EM | Admit: 2024-09-17 | Discharge: 2024-09-17 | Disposition: A | Discharge status: Home / Self Care | Attending: Emergency Medicine | Admitting: Emergency Medicine

## 2024-09-17 ENCOUNTER — Encounter (HOSPITAL_BASED_OUTPATIENT_CLINIC_OR_DEPARTMENT_OTHER): Payer: Self-pay

## 2024-09-17 DIAGNOSIS — Y828 Other medical devices associated with adverse incidents: Secondary | ICD-10-CM | POA: Insufficient documentation

## 2024-09-17 DIAGNOSIS — T8131XA Disruption of external operation (surgical) wound, not elsewhere classified, initial encounter: Secondary | ICD-10-CM | POA: Diagnosis present

## 2024-09-17 DIAGNOSIS — Z79899 Other long term (current) drug therapy: Secondary | ICD-10-CM | POA: Insufficient documentation

## 2024-09-17 DIAGNOSIS — Z940 Kidney transplant status: Secondary | ICD-10-CM | POA: Insufficient documentation

## 2024-09-17 DIAGNOSIS — N63 Unspecified lump in unspecified breast: Secondary | ICD-10-CM

## 2024-09-17 NOTE — Telephone Encounter (Signed)
 Patient called reporting that her site was bleeding at the insertion site of where a port was placed today. Dressing covered with guaze and clear dressing is saturated. Per patient per DC instructions do not get dressing wet  and follow up in 2 days. Advised patient to apply pressure to site.  Per Dr. Marlane follow up to have dressing changed and assessed at urgent care or ED. Patient verbalized understanding.

## 2024-09-17 NOTE — ED Triage Notes (Addendum)
 Patient here POV from Home.  Endorses Port was placed today at 1400. Noted bleeding today at home and seeks evaluation for same. No trauma noted to area since placement. Bleeding controlled at this time but bloody drainage noted at incision site near Left Subclavian area.  NAD noted during triage. A&Ox4. GCS 15. Ambulatory.

## 2024-09-17 NOTE — ED Provider Notes (Signed)
 [place] EMERGENCY DEPARTMENT AT [facility] Provider Note   CSN: [id number] Arrival date & time: 09/17/24  1935     Patient presents with: Wound Check   Christina Vasquez is a 68 y.o. female.   Patient to ED with bleeding from site of port in upper left chest that was placed earlier today. History of renal transplant 05/2024, port placed for regular infusions. She noticed blood under the covering bandage when she returned home from [place] earlier this evening.  The history is provided by the patient. No language interpreter was used.  Wound Check       Prior to Admission medications   Medication Sig Start Date End Date Taking? Authorizing Provider  acetaminophen  (TYLENOL ) 650 MG CR tablet Take 1,300 mg by mouth every 8 (eight) hours as needed for pain.    [provider]  amLODipine  (NORVASC ) 10 MG tablet Take 10 mg by mouth daily.    [provider]  azaTHIOprine (IMURAN) 50 MG tablet Take 100 mg by mouth. 09/04/24 09/04/25  [provider]  carvedilol  (COREG ) 25 MG tablet Take 1 tablet (25 mg total) by mouth 2 (two) times daily with a meal. Do not take on days before dialysis Do not take after dialysis if systolic blood pressure is less than 110. 08/09/23   Rosan Deward ORN, NP  cholecalciferol (VITAMIN D3) 25 MCG (1000 UNIT) tablet Take 2,000 Units by mouth. 07/10/24   [provider]  cinacalcet  (SENSIPAR ) 30 MG tablet Take 30 mg by mouth every Monday, Wednesday, and Friday.    [provider]  gabapentin  (NEURONTIN ) 100 MG capsule Take 2 capsules (200 mg total) by mouth 3 (three) times daily. 08/14/23   Onita Duos, MD  magnesium chloride (SLOW-MAG) 64 MG TBEC SR tablet Take 71.5 mg by mouth. 07/24/24   [provider]  NIFEdipine (ADALAT CC) 90 MG 24 hr tablet Take 90 mg by mouth. 08/07/24   [provider]  omeprazole (PRILOSEC) 40 MG capsule Take 40 mg by mouth daily.    [provider]   oxyCODONE -acetaminophen  (PERCOCET) 5-325 MG tablet Take 1 tablet by mouth every 6 (six) hours as needed for severe pain (pain score 7-10). 04/24/24 04/24/25  Baglia, Corrina, PA-C  predniSONE (DELTASONE) 5 MG tablet Take 5 mg by mouth daily. 07/08/24   [provider]  Semaglutide,0.25 or 0.5MG /DOS, (OZEMPIC, 0.25 OR 0.5 MG/DOSE,) 2 MG/1.5ML SOPN Inject 0.5 mg into the skin once a week.    [provider]  sucroferric oxyhydroxide (VELPHORO ) 500 MG chewable tablet Chew 1,000-1,500 mg by mouth See admin instructions. Take 1500 mg with each meal and 1000 mg with each snack    [provider]  sulfamethoxazole-trimethoprim (BACTRIM) 400-80 MG tablet Take 1 tablet by mouth 3 (three) times a week.    [provider]  tacrolimus (PROGRAF) 1 MG capsule Take by mouth.    [provider]  venlafaxine  XR (EFFEXOR -XR) 37.5 MG 24 hr capsule Take 37.5 mg by mouth daily with breakfast. Patient taking differently: Take 75 mg by mouth daily with breakfast.    [provider]  VITAMIN D PO Take 5 capsules by mouth every Monday, Wednesday, and Friday with hemodialysis.    [provider]    Allergies: Meloxicam, Augmentin  [amoxicillin -pot clavulanate], and Tetracyclines & related    Review of Systems  Updated Vital Signs BP (!) 153/95 (BP Location: Right Arm)   Pulse (!) 103   Temp 97.7 F (36.5 C) (Temporal)  Resp 18   Ht 5' 2 (1.575 m)   Wt 102.1 kg   SpO2 97%   BMI 41.15 kg/m   Physical Exam Constitutional:      General: She is not in acute distress.    Appearance: She is well-developed. She is not ill-appearing.  Neck:     Comments: Tegaderm in place over point of entry for port without bleeding. There is blood under the tegaderm at the upper border of the bandage. No active bleeding. There is lose dermabond over a small incision. No surrounding swelling.  Pulmonary:     Effort: Pulmonary effort is normal.  Musculoskeletal:         General: Normal range of motion.     Cervical back: Normal range of motion.  Skin:    General: Skin is warm and dry.  Neurological:     Mental Status: She is alert and oriented to person, place, and time.     (all labs ordered are listed, but only abnormal results are displayed) Labs Reviewed - No data to display  EKG: None  Radiology: No results found.   Procedures   Medications Ordered in the ED - No data to display  Clinical Course as of 09/17/24 2137  Wed Sep 17, 2024  2133 The bandage over the port itself is clean and has no signs of bleeding. This area was not exposed. Thelose dermabond over the incision removed, area cleansed with betadine and sterile saline. No bleeding visualized. Dermabond reapplied and area was rebandaged.   The patient has been seen by Dr. Dreama. She is felt appropriate for discharge home. Discussed return precautions.  [SU]    Clinical Course User Index [SU] Odell Balls, PA-C                                 Medical Decision Making       Final diagnoses:  Dehiscence of operative wound, initial encounter    ED Discharge Orders     None          Odell Balls RIGGERS 09/17/24 2137    Dreama Longs, MD 09/18/24 [phone]

## 2024-09-17 NOTE — Discharge Instructions (Signed)
 Please return to the ED with any further bleeding or other new concern regarding the incision site.

## 2024-09-17 NOTE — Nursing Note (Addendum)
 Single lumen port placement performed in IR. Pt received 2g ancef  IV, 2mg  of versed  and 100mcg of fentanyl . Home care instructions provided in postprocedure, pt expresses understanding. Pt tolerated procedure well. Report given to nurse, Sonny, in IR post procedure.

## 2024-09-25 ENCOUNTER — Encounter

## 2024-09-25 ENCOUNTER — Other Ambulatory Visit

## 2024-10-06 ENCOUNTER — Ambulatory Visit
Admission: RE | Admit: 2024-10-06 | Discharge: 2024-10-06 | Disposition: A | Source: Ambulatory Visit | Attending: Family Medicine | Admitting: Family Medicine

## 2024-10-06 ENCOUNTER — Other Ambulatory Visit: Payer: Self-pay | Admitting: Family Medicine

## 2024-10-06 DIAGNOSIS — N63 Unspecified lump in unspecified breast: Secondary | ICD-10-CM

## 2024-10-06 DIAGNOSIS — N6320 Unspecified lump in the left breast, unspecified quadrant: Secondary | ICD-10-CM

## 2024-10-07 ENCOUNTER — Inpatient Hospital Stay: Admission: RE | Admit: 2024-10-07 | Discharge: 2024-10-07 | Attending: Family Medicine | Admitting: Family Medicine

## 2024-10-07 ENCOUNTER — Ambulatory Visit
Admission: RE | Admit: 2024-10-07 | Discharge: 2024-10-07 | Disposition: A | Source: Ambulatory Visit | Attending: Family Medicine | Admitting: Family Medicine

## 2024-10-07 DIAGNOSIS — N6320 Unspecified lump in the left breast, unspecified quadrant: Secondary | ICD-10-CM

## 2024-10-07 DIAGNOSIS — N63 Unspecified lump in unspecified breast: Secondary | ICD-10-CM

## 2024-10-07 HISTORY — PX: BREAST BIOPSY: SHX20

## 2024-10-08 LAB — SURGICAL PATHOLOGY

## 2024-11-16 ENCOUNTER — Other Ambulatory Visit (HOSPITAL_BASED_OUTPATIENT_CLINIC_OR_DEPARTMENT_OTHER): Payer: Self-pay | Admitting: Family Medicine

## 2024-11-16 DIAGNOSIS — M810 Age-related osteoporosis without current pathological fracture: Secondary | ICD-10-CM

## 2024-12-10 ENCOUNTER — Other Ambulatory Visit (HOSPITAL_COMMUNITY): Payer: Self-pay

## 2024-12-17 ENCOUNTER — Other Ambulatory Visit (HOSPITAL_BASED_OUTPATIENT_CLINIC_OR_DEPARTMENT_OTHER)
